# Patient Record
Sex: Female | Born: 1955 | Race: White | Hispanic: No | Marital: Married | Smoking: Former smoker
Health system: Southern US, Community
[De-identification: ages and names within clinical notes are randomized; demographics above are authoritative.]

## PROBLEM LIST (undated history)

## (undated) DIAGNOSIS — R634 Abnormal weight loss: Secondary | ICD-10-CM

## (undated) DIAGNOSIS — A159 Respiratory tuberculosis unspecified: Secondary | ICD-10-CM

---

## 2002-10-01 ENCOUNTER — Encounter: Admission: RE | Admit: 2002-10-01 | Discharge: 2002-10-01 | Payer: Self-pay | Admitting: Specialist

## 2002-10-01 ENCOUNTER — Encounter: Payer: Self-pay | Admitting: Specialist

## 2003-08-23 ENCOUNTER — Other Ambulatory Visit: Admission: RE | Admit: 2003-08-23 | Discharge: 2003-08-23 | Payer: Self-pay | Admitting: Gynecology

## 2004-08-28 ENCOUNTER — Encounter: Admission: RE | Admit: 2004-08-28 | Discharge: 2004-08-28 | Payer: Self-pay | Admitting: Internal Medicine

## 2004-09-18 ENCOUNTER — Encounter (INDEPENDENT_AMBULATORY_CARE_PROVIDER_SITE_OTHER): Payer: Self-pay | Admitting: Specialist

## 2004-09-18 ENCOUNTER — Observation Stay (HOSPITAL_COMMUNITY): Admission: RE | Admit: 2004-09-18 | Discharge: 2004-09-19 | Payer: Self-pay | Admitting: General Surgery

## 2005-04-15 IMAGING — RF DG CHOLANGIOGRAM OPERATIVE
1 series · 12 of 12 positions shown · non-contrast
Comparison: none

CLINICAL DATA: Cholecystectomy for cholelithiasis.

INTRAOPERATIVE CHOLANGIOGRAM
TECHNIQUE: Multiple fluoroscopic spot radiographs were obtained during intraoperative
cholangiogram, and are submitted for interpretation post-operatively.

[Series 1: run · 12 of 12 slices shown]
[im 1/12]
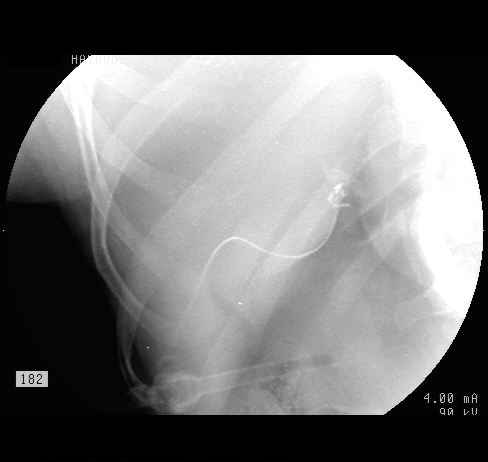
[im 2/12]
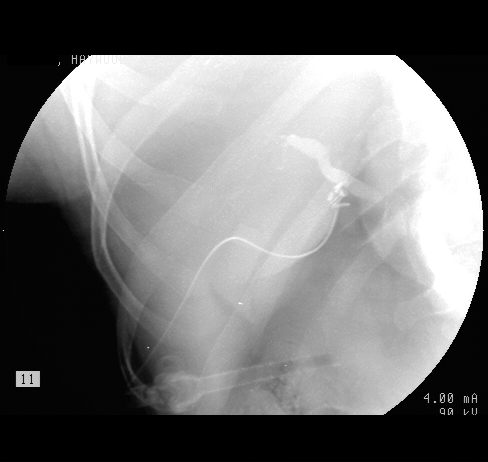
[im 3/12]
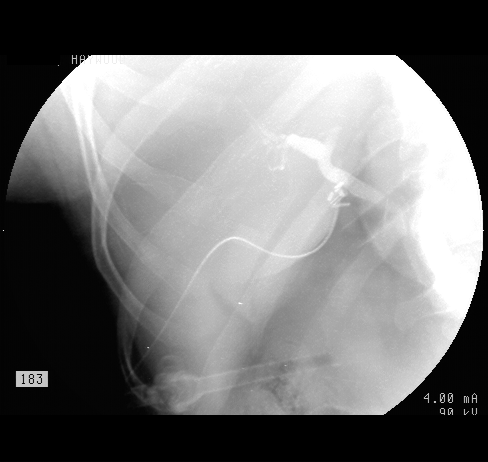
[im 4/12]
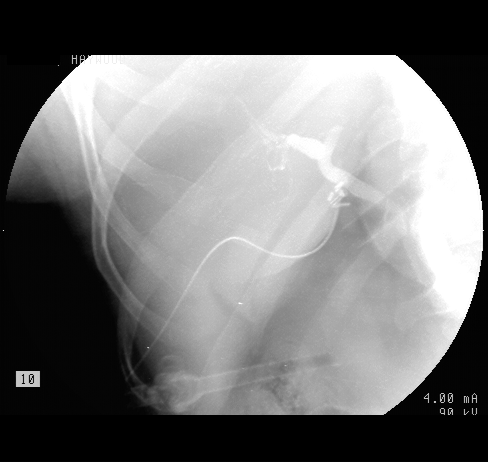
[im 5/12]
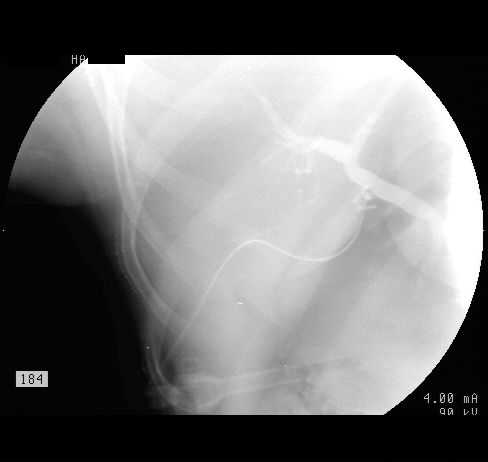
[im 6/12]
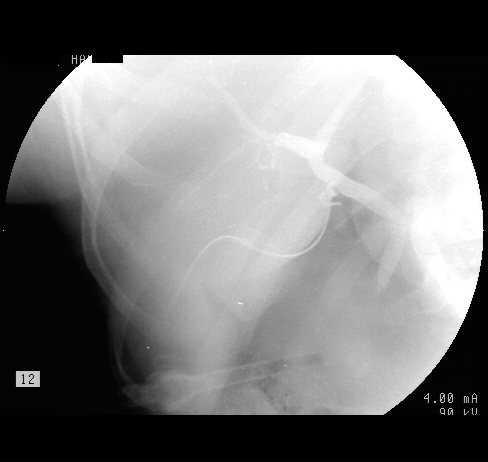
[im 7/12]
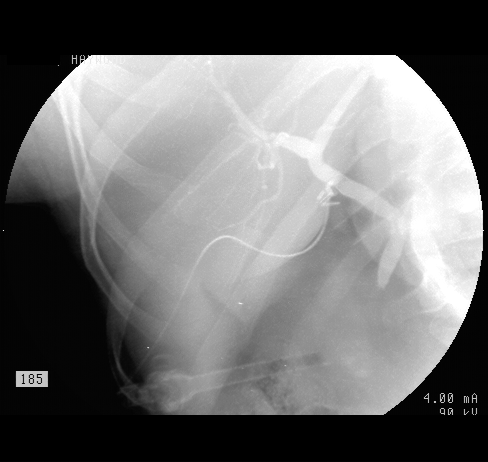
[im 8/12]
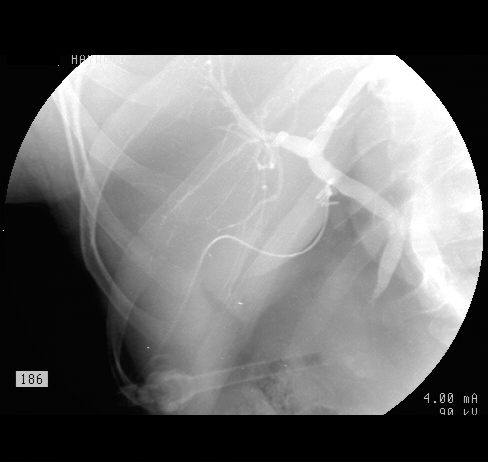
[im 9/12]
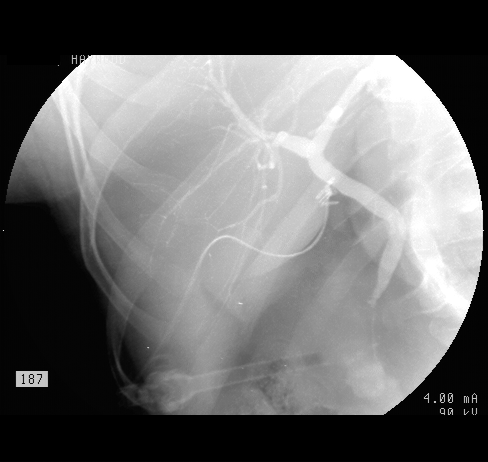
[im 10/12]
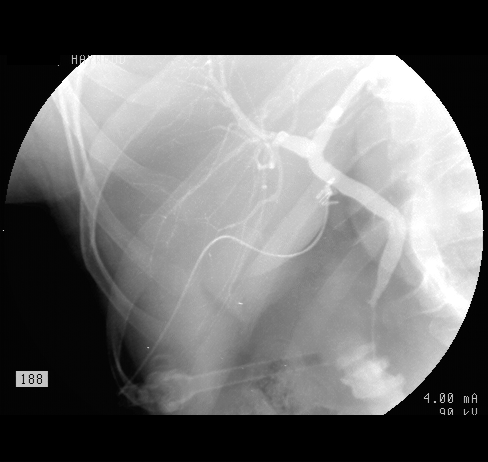
[im 11/12]
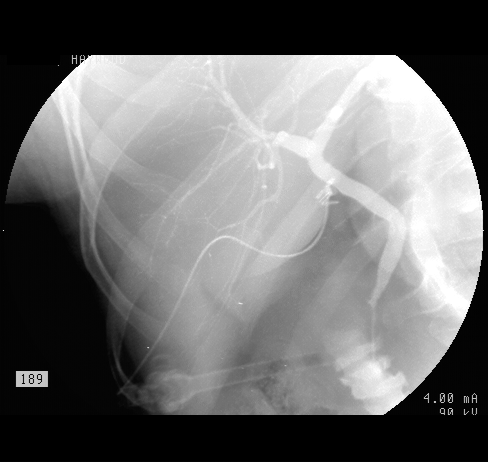
[im 12/12]
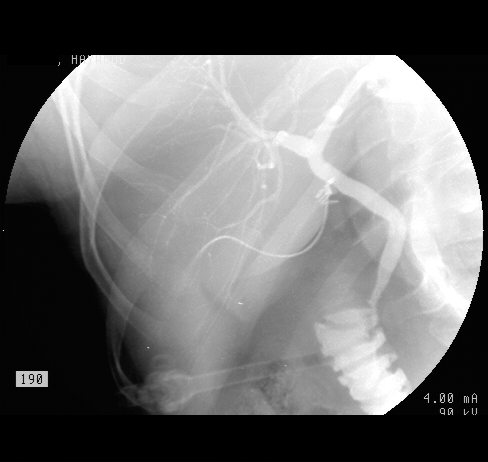

[12 of 12 positions shown; findings below may reference images not displayed]

FINDINGS: Normal appearing bile ducts with free flow of contrast into the duodenum. No intraductal
filling defects. Cholecystectomy clips.

IMPRESSION

No retained ductal stones.

## 2007-01-30 ENCOUNTER — Ambulatory Visit: Payer: Self-pay | Admitting: Family Medicine

## 2007-01-30 LAB — CONVERTED CEMR LAB
AST: 28 units/L (ref 0–37)
Bilirubin, Direct: 0.2 mg/dL (ref 0.0–0.3)
Eosinophils Absolute: 0.1 10*3/uL (ref 0.0–0.6)
Eosinophils Relative: 1.9 % (ref 0.0–5.0)
GFR calc Af Amer: 137 mL/min
GFR calc non Af Amer: 113 mL/min
Glucose, Bld: 112 mg/dL — ABNORMAL HIGH (ref 70–99)
HCT: 42.6 % (ref 36.0–46.0)
Hemoglobin: 15.1 g/dL — ABNORMAL HIGH (ref 12.0–15.0)
Hgb A1c MFr Bld: 6 % (ref 4.6–6.0)
Lymphocytes Relative: 29.5 % (ref 12.0–46.0)
MCV: 86.5 fL (ref 78.0–100.0)
Neutro Abs: 4.3 10*3/uL (ref 1.4–7.7)
Neutrophils Relative %: 60.1 % (ref 43.0–77.0)
Sodium: 143 meq/L (ref 135–145)
TSH: 2.15 microintl units/mL (ref 0.35–5.50)
Total Protein: 7.5 g/dL (ref 6.0–8.3)
WBC: 7.1 10*3/uL (ref 4.5–10.5)

## 2007-02-06 ENCOUNTER — Encounter: Payer: Self-pay | Admitting: Family Medicine

## 2007-02-06 ENCOUNTER — Other Ambulatory Visit: Admission: RE | Admit: 2007-02-06 | Discharge: 2007-02-06 | Payer: Self-pay | Admitting: Family Medicine

## 2007-02-06 ENCOUNTER — Ambulatory Visit: Payer: Self-pay | Admitting: Family Medicine

## 2007-02-23 ENCOUNTER — Encounter: Admission: RE | Admit: 2007-02-23 | Discharge: 2007-02-23 | Payer: Self-pay | Admitting: Family Medicine

## 2007-03-16 ENCOUNTER — Encounter: Admission: RE | Admit: 2007-03-16 | Discharge: 2007-03-16 | Payer: Self-pay | Admitting: Family Medicine

## 2007-03-16 ENCOUNTER — Other Ambulatory Visit: Admission: RE | Admit: 2007-03-16 | Discharge: 2007-03-16 | Payer: Self-pay | Admitting: Interventional Radiology

## 2007-03-16 ENCOUNTER — Encounter (INDEPENDENT_AMBULATORY_CARE_PROVIDER_SITE_OTHER): Payer: Self-pay | Admitting: Specialist

## 2007-05-01 ENCOUNTER — Ambulatory Visit: Payer: Self-pay | Admitting: Endocrinology

## 2007-05-04 ENCOUNTER — Ambulatory Visit: Payer: Self-pay | Admitting: Family Medicine

## 2007-05-10 ENCOUNTER — Encounter: Admission: RE | Admit: 2007-05-10 | Discharge: 2007-05-10 | Payer: Self-pay | Admitting: Family Medicine

## 2007-05-16 ENCOUNTER — Encounter: Admission: RE | Admit: 2007-05-16 | Discharge: 2007-05-16 | Payer: Self-pay | Admitting: Family Medicine

## 2007-09-07 ENCOUNTER — Encounter: Payer: Self-pay | Admitting: *Deleted

## 2007-09-07 DIAGNOSIS — E049 Nontoxic goiter, unspecified: Secondary | ICD-10-CM | POA: Insufficient documentation

## 2007-12-25 ENCOUNTER — Ambulatory Visit: Payer: Self-pay | Admitting: Family Medicine

## 2007-12-25 DIAGNOSIS — E890 Postprocedural hypothyroidism: Secondary | ICD-10-CM

## 2007-12-25 DIAGNOSIS — L719 Rosacea, unspecified: Secondary | ICD-10-CM

## 2008-01-19 ENCOUNTER — Ambulatory Visit: Payer: Self-pay | Admitting: Family Medicine

## 2008-01-19 LAB — CONVERTED CEMR LAB
AST: 29 units/L (ref 0–37)
Basophils Absolute: 0 10*3/uL (ref 0.0–0.1)
Basophils Relative: 0.7 % (ref 0.0–1.0)
CO2: 28 meq/L (ref 19–32)
Chloride: 108 meq/L (ref 96–112)
Creatinine, Ser: 0.7 mg/dL (ref 0.4–1.2)
Direct LDL: 176.9 mg/dL
Eosinophils Relative: 1.4 % (ref 0.0–5.0)
Glucose, Bld: 110 mg/dL — ABNORMAL HIGH (ref 70–99)
Glucose, Urine, Semiquant: NEGATIVE
HCT: 41.8 % (ref 36.0–46.0)
Hemoglobin: 14.6 g/dL (ref 12.0–15.0)
MCHC: 34.8 g/dL (ref 30.0–36.0)
Monocytes Absolute: 0.5 10*3/uL (ref 0.2–0.7)
Neutrophils Relative %: 54.7 % (ref 43.0–77.0)
Nitrite: NEGATIVE
Potassium: 5.1 meq/L (ref 3.5–5.1)
Protein, U semiquant: NEGATIVE
RBC: 4.84 M/uL (ref 3.87–5.11)
RDW: 11.3 % — ABNORMAL LOW (ref 11.5–14.6)
Sodium: 142 meq/L (ref 135–145)
TSH: 2.7 microintl units/mL (ref 0.35–5.50)
Total Bilirubin: 1.1 mg/dL (ref 0.3–1.2)
Total CHOL/HDL Ratio: 6.2
Total Protein: 7.1 g/dL (ref 6.0–8.3)
WBC: 6.3 10*3/uL (ref 4.5–10.5)
pH: 5

## 2008-02-22 ENCOUNTER — Telehealth: Payer: Self-pay | Admitting: Family Medicine

## 2008-03-01 ENCOUNTER — Ambulatory Visit: Payer: Self-pay | Admitting: Family Medicine

## 2008-03-01 ENCOUNTER — Other Ambulatory Visit: Admission: RE | Admit: 2008-03-01 | Discharge: 2008-03-01 | Payer: Self-pay | Admitting: Family Medicine

## 2008-03-01 ENCOUNTER — Encounter: Payer: Self-pay | Admitting: Family Medicine

## 2008-03-01 DIAGNOSIS — N6019 Diffuse cystic mastopathy of unspecified breast: Secondary | ICD-10-CM

## 2008-03-01 DIAGNOSIS — L738 Other specified follicular disorders: Secondary | ICD-10-CM

## 2008-03-23 DIAGNOSIS — L255 Unspecified contact dermatitis due to plants, except food: Secondary | ICD-10-CM

## 2008-03-25 ENCOUNTER — Ambulatory Visit: Payer: Self-pay | Admitting: Family Medicine

## 2011-04-27 NOTE — Consult Note (Signed)
[facility]                          ENDOCRINOLOGY CONSULTATION   NAME:[name], [name]                    MRN:          [id number]  DATE:05/01/2007                            DOB:          1956-09-21    REFERRING PHYSICIAN:  Tinnie Gens A. Tawanna Cooler, MD   REASON FOR REFERRAL:  Thyroid mass.   HISTORY OF THE PRESENT ILLNESS:  This is a 55 year old woman who was  recently noted by Dr. Tawanna Cooler on a routine examination to have a right-  sided thyroid mass.  She states she does not notice it and it is not  bothering her.   PAST MEDICAL HISTORY:  On the past medical history she is otherwise  healthy.   MEDICATIONS:  No medications.   SOCIAL HISTORY:  The patient works in Designer, fashion/clothing.  She is married.  She is  originally from [place].  She states that she did live in [place] for a  time, but was in an area unaffected by the 1986 [employer] developer.   FAMILY HISTORY:  The family history is negative for thyroid disease.   REVIEW OF SYSTEMS:  The patient denies trouble swallowing or breathing.   PHYSICAL EXAMINATION:  VITAL SIGNS:  On physical examination the blood  pressure is 123/79, heart rate 78, temperature 99.3 and the weight is  188 pounds.  GENERAL APPEARANCE:  In general she is in no distress.  SKIN:  The skin shows no rash.  Not diaphoretic.  HEENT:  The head, eyes, ears, nose and throat exam reveals no proptosis  and no periorbital swelling.  NECK:  There is a 3-cm in diameter right-sided thyroid nodule and a  smaller one is on the left.  Both are easily palpable.  NEUROLOGIC EXAMINATION:  Neurologically the patient is alert and  oriented.  She does not appear anxious nor depressed; and, there is no  tremor.   LABORATORY STUDIES:  The laboratory studies forwarded by Dr. Tawanna Cooler  revealed the following.  On January 30, 2007 TSH was normal at 2.15.  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  The left  thyroid mass cytology showed a similar result.   IMPRESSION:  Two large thyroid nodules, both suspicious for malignancy  on biopsy.   PLAN:  1. I will refer the patient to [facility] to consider at      least a diagnostic thyroid lobectomy; if not, a complete      thyroidectomy.  2. I have asked her to return a week or two postoperatively for follow      up.  3. I discussed all this with the patient and she agrees.     Sean A. Everardo All, MD  Electronically Signed    SAE/MedQ  DD: 05/03/2007  DT: 05/04/2007  Job #: [id number]   cc:   Tinnie Gens A. Tawanna Cooler, MD  Angelia Mould. Derrell Lolling, M.D.

## 2011-04-30 NOTE — Assessment & Plan Note (Signed)
[facility]                            [facility] OFFICE NOTE   NAME:HUTCHENSMiami, Latulippe                    MRN:          [id number]  DATE:02/06/2007                            DOB:          Jun 13, 1956    HISTORY OF PRESENT ILLNESS:  This 55 year old married white female  gravida 1, para 1, AB 0, originally from [place], [place] comes in today  as a new patient for physical evaluation.   PAST MEDICAL HISTORY/PAST HOSPITALIZATIONS:  Her gallbladder removed in  2005. A hysterectomy in 2005, but it was done in [place] and they took  her uterus out, left her cervix in, removed both ovaries.  Prior to that  she had childbirth x1.  She has a daughter who is 48 and lives back in  [place].  Outpatient surgery none.  Illnesses none.  Injuries none.   DRUG ALLERGIES:  None.   She does not smoke or drink any alcohol.   CURRENT MEDICATIONS:  She had been taking iron for the past 3 years  because she was told at the time of her hysterectomy, which she had done  for DUB, that she was anemic.  No one ever told her to stop it.   She also has adult acne and would like some medication for that.   REVIEW OF SYSTEMS:  HEENT:  Negative except she wears glasses for  distance vision.  She gets regular dental care. CARDIOPULMONARY:  Negative.  GI:  Negative. GU:  Negative.  GYN:  Gravida 1, para 1, AB 0.  She had a hysterectomy for DUB, no cancer.  Cervix was left intact.  She  does do BSE on an irregular basis and advised to do it monthly.  Also  advised to get annual mammography.  ENDO:  Negative.  Weight steady.  MUSCULOSKELETAL:  Negative. VASCULAR:  Negative.  DERM:  Negative except  for adult acne.   SOCIAL HISTORY:  She is originally from [place], [place]. She met her  husband in [place], they dated for a couple of years, married.  She has  lived her for 3 years.  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  Married, 1 daughter as noted above, 27, lives back  in [place].   FAMILY HISTORY:  Dad died at 37 motor vehicle accident.  Mom 34, alive  and well.  No brothers, no sisters.   VACCINATION HISTORY:  Of note, she says she has it at home and she will  look it up.   PHYSICAL EVALUATION:  VITAL SIGNS:  Height 5 foot 4-3/4 inches.  Weight  186.  BP 120/87, pulse regular.  She is afebrile.  GENERAL:  She is a well-developed, well-nourished white female in no  acute distress, overweight.  HEENT:  Negative.  NECK:  No carotid bruits, thyroid was asymmetric, the right gland being  larger than the left.  It feels like she has a asymmetric goiter, it is  nodular.  CHEST:  Clear to auscultation.  CARDIAC:  Negative.  BREAST:  Negative. BSE was taught and she was asked to __________, also  get annual mammography.  ABDOMEN:  Was negative except for scars from previous gallbladder  removal and hysterectomy.  As noted above, her cervix was left intact.  BIMANUAL:  External genitalia within normal limits.  Vaginal vault was  normal.  Cervix visualized. Pap smear was done.  Bimanual exam showed an  absent uterus and nonpalpable ovaries.  RECTAL:  Normal.  Stool guaiac negative.  EXTREMITIES:  Normal skin.  Peripheral pulses normal.   LABORATORY DATA:  Shows a normal CBC, metabolic panel.  Elevated blood  sugar 112 with an A1c of 6.0.  Cholesterol 230. HDL low at 36, LDL high  at 176.  The rest of her studies look fine.   EKG was within normal limits.   IMPRESSION:  1. Glucose intolerance.  Plan:  Discussed diet, exercise, weight loss.      Three months get an A1c and a fasting blood sugar.  2. Hyperlipidemia.  Diet, exercise, weight loss.  In 2 months fasting      lipid panel.  3. Status post abdominal hysterectomy.  Cervix was left intact.      Parenthetically this was done in [place].  This is how they do it      over there.  4. Status post cholecystectomy.  5. Status post childbirth x1.  6. Adult acne.  Doxy 100 b.i.d.  She can discontinue  the iron, because      she is no longer anemic.     Jeffrey A. Tawanna Cooler, MD  Electronically Signed    JAT/MedQ  DD: 02/06/2007  DT: 02/07/2007  Job #: [id number]

## 2011-04-30 NOTE — Op Note (Signed)
NAMEMAQUITA, SANDOVAL           ACCOUNT NO.:  [id number]   MEDICAL RECORD NO.:  [id number]          PATIENT TYPE:  AMB   LOCATION:  DAY                          FACILITY:  [facility]   PHYSICIAN:  Angelia Mould. Derrell Lolling, M.D.DATE OF BIRTH:  [birth year]   DATE OF PROCEDURE:  09/18/2004  DATE OF DISCHARGE:                                 OPERATIVE REPORT   PREOPERATIVE DIAGNOSES:  Chronic cholecystitis with cholelithiasis.   POSTOPERATIVE DIAGNOSES:  Chronic cholecystitis with cholelithiasis.   OPERATION:  Laparoscopic cholecystectomy with intraoperative cholangiogram.   SURGEON:  Angelia Mould. Derrell Lolling, M.D.   FIRST ASSISTANT:  Sharlet Salina T. Hoxworth, M.D.   INDICATIONS FOR PROCEDURE:  This is a 55 year old white female who has a  long history of heartburn, bloating, epigastric pain and mild nausea.  She  ultimately underwent workup recently and a gallbladder ultrasound on August 11, 2004 showed numerous gallstones and one large stone measuring 2.2 cm.  The common bile duct measured 7 mm.  She has had liver function tests and  they are normal. She has been evaluated as an outpatient and cholecystectomy  advised. She is brought to the operating room electively.   FINDINGS:  The gallbladder was chronically inflamed and thick walled and had  a lot of scar tissue around it. There were moderate adhesions of the  duodenum to the infundibulum but these were soft and chronic, they were  taken down without incident. The liver, stomach, duodenum, small intestines  and large intestines were grossly normal to inspection. She had a lot of  adhesions in the lower abdominal wall from her previous hysterectomy.   TECHNIQUE:  Following the induction of general endotracheal anesthesia, the  patient's abdomen was prepped and draped in a sterile fashion.  Marcaine  0.5% with epinephrine was used as a local infiltration anesthetic.  A  vertically oriented incision was made at the lower rim of the umbilicus. The  fascia was incised in the midline and the abdominal cavity entered under  direct vision.  A 10 mm Hasson trocar was inserted and secured with a  pursestring suture of #0 Vicryl. Pneumoperitoneum was created. The video  camera was inserted with visualization and findings as described above.  A  10 mm trocar was placed in the subxiphoid region, two 5 mm trocars were  placed in the right mid abdomen.  The gallbladder fundus was identified and  grasped and elevated. We dissected all the adhesions down off of the  infundibulum of the gallbladder and retracted the infundibulum laterally.  We spent some time resecting out the cystic duct and the cystic artery. We  actually did the anterior branch of the cystic artery, secured it with  multiple metal clips and divided it. This created a larger window behind the  cystic duct and we isolated the cystic duct.  A cholangiogram catheter was  inserted into the cystic duct and a cholangiogram was obtained using the C-  arm. This showed normal intrahepatic and extrahepatic biliary anatomy, no  filling defect, and prompt flow of contrast into the duodenum.  The  cholangiogram catheter was removed, the cystic duct secured with  multiple  metal clips and divided. We then dissected out a posterior branch to the  cystic artery and secured it with about three metal clips and divided it. We  dissected the gallbladder from its bed with electrocautery. This dissection  was somewhat tedious due to chronic scar tissue. The gallbladder was placed  in the specimen bag and removed through the umbilical port.  The gallbladder  contains numerous large multifaceted black stones.   The operative field was copiously irrigated with saline. All of the  irrigation fluid ultimately returned clear. There was no bleeding and no  bile leak whatsoever from the bed of the gallbladder. The trocars were  removed under direct vision. There was no bleeding from the trocar sites.  The  pneumoperitoneum was released. The fascia at the umbilicus was closed  with #0 Vicryl suture. The skin incision was closed with subcuticular  sutures of 4-0 Vicryl and Steri-Strips.  Clean bandages were placed and the  patient taken to the recovery room in stable condition. Estimated blood loss  was about 20 mL. Complications none. Sponge, needle and instrument counts  were correct.     Hayw   HMI/MEDQ  D:  09/18/2004  T:  09/18/2004  Job:  [id number]   cc:   Martha Clan, M.D.

## 2019-01-03 ENCOUNTER — Ambulatory Visit: Payer: Self-pay | Admitting: Internal Medicine

## 2019-01-05 ENCOUNTER — Ambulatory Visit: Payer: Self-pay | Admitting: Internal Medicine

## 2020-03-07 ENCOUNTER — Encounter: Payer: Self-pay | Admitting: Family Medicine

## 2020-03-07 ENCOUNTER — Ambulatory Visit (INDEPENDENT_AMBULATORY_CARE_PROVIDER_SITE_OTHER): Payer: 59 | Admitting: Family Medicine

## 2020-03-07 ENCOUNTER — Other Ambulatory Visit: Payer: Self-pay

## 2020-03-07 VITALS — BP 130/80 | HR 86 | Temp 96.7°F | Resp 16 | Ht 66.0 in | Wt 135.0 lb

## 2020-03-07 DIAGNOSIS — E113599 Type 2 diabetes mellitus with proliferative diabetic retinopathy without macular edema, unspecified eye: Secondary | ICD-10-CM | POA: Insufficient documentation

## 2020-03-07 DIAGNOSIS — E113512 Type 2 diabetes mellitus with proliferative diabetic retinopathy with macular edema, left eye: Secondary | ICD-10-CM | POA: Diagnosis not present

## 2020-03-07 DIAGNOSIS — I8390 Asymptomatic varicose veins of unspecified lower extremity: Secondary | ICD-10-CM | POA: Insufficient documentation

## 2020-03-07 DIAGNOSIS — E538 Deficiency of other specified B group vitamins: Secondary | ICD-10-CM | POA: Insufficient documentation

## 2020-03-07 DIAGNOSIS — E1169 Type 2 diabetes mellitus with other specified complication: Secondary | ICD-10-CM

## 2020-03-07 DIAGNOSIS — E890 Postprocedural hypothyroidism: Secondary | ICD-10-CM

## 2020-03-07 DIAGNOSIS — E785 Hyperlipidemia, unspecified: Secondary | ICD-10-CM

## 2020-03-07 DIAGNOSIS — I8393 Asymptomatic varicose veins of bilateral lower extremities: Secondary | ICD-10-CM | POA: Diagnosis not present

## 2020-03-07 LAB — COMPREHENSIVE METABOLIC PANEL
ALT: 9 U/L (ref 0–35)
AST: 9 U/L (ref 0–37)
Albumin: 4.1 g/dL (ref 3.5–5.2)
Alkaline Phosphatase: 87 U/L (ref 39–117)
BUN: 15 mg/dL (ref 6–23)
CO2: 26 mEq/L (ref 19–32)
Calcium: 9.7 mg/dL (ref 8.4–10.5)
Chloride: 97 mEq/L (ref 96–112)
Creatinine, Ser: 0.64 mg/dL (ref 0.40–1.20)
GFR: 94.1 mL/min (ref >60.00)
Glucose, Bld: 366 mg/dL — ABNORMAL HIGH (ref 70–99)
Potassium: 4.3 mEq/L (ref 3.5–5.1)
Sodium: 131 mEq/L — ABNORMAL LOW (ref 135–145)
Total Bilirubin: 1.1 mg/dL (ref 0.2–1.2)
Total Protein: 7.2 g/dL (ref 6.0–8.3)

## 2020-03-07 LAB — LDL CHOLESTEROL, DIRECT: Direct LDL: 171 mg/dL

## 2020-03-07 LAB — HEMOGLOBIN A1C: Hgb A1c MFr Bld: 13.7 % — ABNORMAL HIGH (ref 4.6–6.5)

## 2020-03-07 LAB — TSH: TSH: 8.39 u[IU]/mL — ABNORMAL HIGH (ref 0.35–4.50)

## 2020-03-07 LAB — LIPID PANEL
Cholesterol: 245 mg/dL — ABNORMAL HIGH (ref 0–200)
HDL: 42.7 mg/dL (ref >39.00)
NonHDL: 202.01
Total CHOL/HDL Ratio: 6
Triglycerides: 247 mg/dL — ABNORMAL HIGH (ref 0.0–149.0)
VLDL: 49.4 mg/dL — ABNORMAL HIGH (ref 0.0–40.0)

## 2020-03-07 LAB — MICROALBUMIN / CREATININE URINE RATIO
Creatinine,U: 39.6 mg/dL
Microalb Creat Ratio: 3 mg/g (ref 0.0–30.0)
Microalb, Ur: 1.2 mg/dL (ref 0.0–1.9)

## 2020-03-07 MED ORDER — FREESTYLE LIBRE READER DEVI: 1.0000 | 3 refills | Status: AC

## 2020-03-07 MED ORDER — FREESTYLE LIBRE SENSOR SYSTEM MISC: 1.0000 | Freq: Three times a day (TID) | 2 refills | Status: AC | PRN

## 2020-03-07 NOTE — Assessment & Plan Note (Signed)
We discussed some CV benefits of statin medication. Recommendations will be given according to lipid panel results.

## 2020-03-07 NOTE — Assessment & Plan Note (Signed)
Asymptomatic. LE elevation and/or compression stockings may help with progression.

## 2020-03-07 NOTE — Progress Notes (Signed)
HPI:   [name] [name] is a 64 y.o. female, who is here today with her husband to establish care.  Former PCP: N/A Last preventive routine visit: 02/2019.  She is from [place] and may go back May or June this year. Chronic medical problems: DM II, postsurgical hypothyroidism, and hyperlipidemia among some. Former smoker. She smoked for 30+ years and quit 5 months ago.  In average one half PPD.  DM2 diagnosed at age 17. Currently she is on Metformin 850 mg twice daily. She denies checking BS. She would like a glucose sensor, so she does not have to stick fingers. She is tolerating medication well. She is not sure about her last A1c result.  Negative for abdominal pain, nausea,vomiting, polydipsia,polyuria, or polyphagia.  Last ophthalmologic exam on 01/01/20. 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  Hyperlipidemia: On 02/22/2019 TC 276, TG 286, HDL 46, LDL 173. On 07/02/2019: TC 164, TG 109, HDL 43, LDL 99. She discontinued cholesterol medication, not sure which when she was taking.  Negative for severe/frequent headache, chest pain, dyspnea, palpitation, claudication, focal weakness, or edema.  Postsurgical hypothyroidism: Currently she is on levothyroxine 50 mcg daily. According to patient, pathology did not show thyroid cancer.  She has not noted dysphagia, palpitations, abdominal pain, changes in bowel habits, tremor, cold/heat intolerance, or abnormal weight loss.  Lab Results  Component Value Date   TSH 2.70 01/19/2008   LE varicose veins, tortuous. It has been getting worse throughout the years. Denies pain, skin lesions, or edema. She does not wear compression stockings.  Review of Systems  Constitutional: Negative for activity change, appetite change, fatigue and fever.  HENT: Negative for mouth sores, nosebleeds and sore throat.   Eyes:  Negative for redness and visual disturbance.  Respiratory: Negative for cough and wheezing.   Cardiovascular: Negative for leg swelling.  Gastrointestinal: Negative for abdominal pain, nausea and vomiting.       Negative for changes in bowel habits.  Genitourinary: Negative for decreased urine volume, dysuria and hematuria.  Musculoskeletal: Negative for gait problem and myalgias.  Neurological: Negative for syncope, facial asymmetry and numbness.  Rest see pertinent positives and negatives per HPI.   Current Outpatient Medications on File Prior to Visit  Medication Sig Dispense Refill  . Cholecalciferol (VITAMIN D3 PO) Take 1 tablet by mouth daily.    . Cyanocobalamin (B-12 PO) Take 1 tablet by mouth daily.    Marland Kitchen levothyroxine (SYNTHROID) 50 MCG tablet Take 50 mcg by mouth daily before breakfast.    . metFORMIN (GLUCOPHAGE) 850 MG tablet Take 1 tablet by mouth 2 (two) times daily with a meal.     No current facility-administered medications on file prior to visit.   History reviewed. No pertinent past medical history. No Known Allergies  History reviewed. No pertinent family history.  Social History   Socioeconomic History  . Marital status: Married    Spouse name: Not on file  . Number of children: Not on file  . Years of education: Not on file  . Highest education level: Not on file  Occupational History  . Not on file  Tobacco Use  . Smoking status: Never Smoker  . Smokeless tobacco: Never Used  Substance and Sexual Activity  . Alcohol use: Not Currently  . Drug use: Never  . Sexual activity: Not Currently  Other Topics Concern  . Not on file  Social History  Narrative  . Not on file   Social Determinants of Health   Financial Resource Strain:   . Difficulty of Paying Living Expenses:   Food Insecurity:   . Worried About Charity fundraiser in the Last Year:   . Arboriculturist in the Last Year:   Transportation Needs:   . Film/video editor (Medical):     Marland Kitchen Lack of Transportation (Non-Medical):   Physical Activity:   . Days of Exercise per Week:   . Minutes of Exercise per Session:   Stress:   . Feeling of Stress :   Social Connections:   . Frequency of Communication with Friends and Family:   . Frequency of Social Gatherings with Friends and Family:   . Attends Religious Services:   . Active Member of Clubs or Organizations:   . Attends Archivist Meetings:   Marland Kitchen Marital Status:     Vitals:   03/07/20 1016  BP: 130/80  Pulse: 86  Resp: 16  Temp: (!) 96.7 F (35.9 C)  SpO2: 96%   Body mass index is 21.79 kg/m.   Physical Exam  Nursing note and vitals reviewed. Constitutional: She is oriented to person, place, and time. She appears well-developed and well-nourished. No distress.  HENT:  Head: Normocephalic and atraumatic.  Mouth/Throat: Oropharynx is clear and moist and mucous membranes are normal.  Eyes: Pupils are equal, round, and reactive to light. Conjunctivae are normal.  Cardiovascular: Normal rate and regular rhythm.  No murmur heard. Pulses:      Dorsalis pedis pulses are 2+ on the right side and 2+ on the left side.  Tortuous varicose veins, bilateral but right calf is worse.  Respiratory: Effort normal and breath sounds normal. No respiratory distress.  GI: Soft. She exhibits no mass. There is no hepatomegaly. There is no abdominal tenderness.  Musculoskeletal:        General: No edema.  Lymphadenopathy:    She has no cervical adenopathy.  Neurological: She is alert and oriented to person, place, and time. She has normal strength. No cranial nerve deficit. Gait normal.  Skin: Skin is warm. No rash noted. No erythema.  Psychiatric: She has a normal mood and affect.  Well groomed, good eye contact.   Diabetic Foot Exam - Simple   Simple Foot Form Diabetic Foot exam was performed with the following findings: Yes 03/07/2020  1:15 PM  Visual Inspection See comments: Yes Sensation Testing Intact to  touch and monofilament testing bilaterally: Yes Pulse Check Posterior Tibialis and Dorsalis pulse intact bilaterally: Yes Comments Small bunion, bilateral.    ASSESSMENT AND PLAN:  Ms. [name] [name] was seen today for establish care.  Diagnoses and all orders for this visit:  Orders Placed This Encounter  Procedures  . Comprehensive metabolic panel  . Lipid panel  . Hemoglobin A1c  . Fructosamine  . Microalbumin / creatinine urine ratio  . TSH   Lab Results  Component Value Date   CREATININE 0.64 03/07/2020   BUN 15 03/07/2020   NA 131 (L) 03/07/2020   K 4.3 03/07/2020   CL 97 03/07/2020   CO2 26 03/07/2020   Lab Results  Component Value Date   ALT 9 03/07/2020   AST 9 03/07/2020   ALKPHOS 87 03/07/2020   BILITOT 1.1 03/07/2020   Lab Results  Component Value Date   HGBA1C 13.7 (H) 03/07/2020   Lab Results  Component Value Date   MICROALBUR 1.2 03/07/2020    Lab Results  Component Value Date   CHOL 245 (H) 03/07/2020   HDL 42.70 03/07/2020   LDLDIRECT 171.0 03/07/2020   TRIG 247.0 (H) 03/07/2020   CHOLHDL 6 03/07/2020    Hypothyroidism, postsurgical Continue levothyroxine 50 mcg daily. Further recommendation will be given according to TSH result.  Hyperlipidemia associated with type 2 diabetes mellitus (HCC) We discussed some CV benefits of statin medication. Recommendations will be given according to lipid panel results.  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 For now continue Metformin 850 mg twice daily. Regular exercise and healthy diet with avoidance of added sugar food intake is an important part of treatment and recommended. Continue periodic eye examination,dental and foot care recommended. F/U in 5 months   Varicose vein of leg Asymptomatic. LE elevation and/or compression stockings may help with progression.   Return in about 5 months (around 08/07/2020) for DM  II,HLD.   Adib Wahba G. [place], MD  [facility]. [place] office.

## 2020-03-07 NOTE — Patient Instructions (Addendum)
A few things to remember from today's visit:   No changes today. Further recommendations according to lab results.  You should be on a cholesterol medication for cardiovascular protection.   Please be sure medication list is accurate. If a new problem present, please set up appointment sooner than planned today.

## 2020-03-07 NOTE — Assessment & Plan Note (Signed)
Continue levothyroxine 50 mcg daily. Further recommendation will be given according to TSH result. 

## 2020-03-07 NOTE — Assessment & Plan Note (Signed)
Problem has not been well controlled. For now continue Metformin 850 mg twice daily. Regular exercise and healthy diet with avoidance of added sugar food intake is an important part of treatment and recommended. Continue periodic eye examination,dental and foot care recommended. F/U in 5 months

## 2020-03-12 LAB — FRUCTOSAMINE: Fructosamine: 590 umol/L — ABNORMAL HIGH (ref 205–285)

## 2020-03-19 ENCOUNTER — Encounter: Payer: Self-pay | Admitting: Family Medicine

## 2020-03-21 ENCOUNTER — Encounter: Payer: Self-pay | Admitting: Family Medicine

## 2020-08-08 ENCOUNTER — Ambulatory Visit: Payer: Self-pay | Admitting: Family Medicine

## 2020-08-08 DIAGNOSIS — Z0289 Encounter for other administrative examinations: Secondary | ICD-10-CM

## 2021-01-22 ENCOUNTER — Emergency Department (HOSPITAL_COMMUNITY)
Admission: EM | Admit: 2021-01-22 | Discharge: 2021-01-22 | Disposition: A | Discharge status: Home / Self Care | Payer: BLUE CROSS/BLUE SHIELD | Attending: Emergency Medicine | Admitting: Emergency Medicine

## 2021-01-22 ENCOUNTER — Emergency Department (HOSPITAL_COMMUNITY): Payer: BLUE CROSS/BLUE SHIELD

## 2021-01-22 ENCOUNTER — Encounter (HOSPITAL_COMMUNITY): Payer: Self-pay | Admitting: Emergency Medicine

## 2021-01-22 DIAGNOSIS — M545 Low back pain, unspecified: Secondary | ICD-10-CM | POA: Diagnosis not present

## 2021-01-22 DIAGNOSIS — W010XXA Fall on same level from slipping, tripping and stumbling without subsequent striking against object, initial encounter: Secondary | ICD-10-CM

## 2021-01-22 DIAGNOSIS — R519 Headache, unspecified: Secondary | ICD-10-CM | POA: Diagnosis not present

## 2021-01-22 DIAGNOSIS — E113599 Type 2 diabetes mellitus with proliferative diabetic retinopathy without macular edema, unspecified eye: Secondary | ICD-10-CM | POA: Diagnosis not present

## 2021-01-22 DIAGNOSIS — Y92002 Bathroom of unspecified non-institutional (private) residence single-family (private) house as the place of occurrence of the external cause: Secondary | ICD-10-CM | POA: Insufficient documentation

## 2021-01-22 DIAGNOSIS — W0110XA Fall on same level from slipping, tripping and stumbling with subsequent striking against unspecified object, initial encounter: Secondary | ICD-10-CM | POA: Diagnosis not present

## 2021-01-22 DIAGNOSIS — Z7984 Long term (current) use of oral hypoglycemic drugs: Secondary | ICD-10-CM | POA: Insufficient documentation

## 2021-01-22 DIAGNOSIS — S22080A Wedge compression fracture of T11-T12 vertebra, initial encounter for closed fracture: Secondary | ICD-10-CM | POA: Diagnosis present

## 2021-01-22 DIAGNOSIS — Z87891 Personal history of nicotine dependence: Secondary | ICD-10-CM | POA: Diagnosis not present

## 2021-01-22 DIAGNOSIS — E890 Postprocedural hypothyroidism: Secondary | ICD-10-CM | POA: Insufficient documentation

## 2021-01-22 DIAGNOSIS — Z79899 Other long term (current) drug therapy: Secondary | ICD-10-CM | POA: Insufficient documentation

## 2021-01-22 DIAGNOSIS — S22000A Wedge compression fracture of unspecified thoracic vertebra, initial encounter for closed fracture: Secondary | ICD-10-CM

## 2021-01-22 HISTORY — DX: Wedge compression fracture of T11-T12 vertebra, initial encounter for closed fracture: S22.080A

## 2021-01-22 LAB — URINALYSIS, ROUTINE W REFLEX MICROSCOPIC
Bilirubin Urine: NEGATIVE
Glucose, UA: 250 mg/dL — AB
Ketones, ur: NEGATIVE mg/dL
Leukocytes,Ua: NEGATIVE
Nitrite: NEGATIVE
Protein, ur: 100 mg/dL — AB
Specific Gravity, Urine: 1.02 (ref 1.005–1.030)
pH: 6 (ref 5.0–8.0)

## 2021-01-22 LAB — CBC
HCT: 37.7 % (ref 36.0–46.0)
Hemoglobin: 12.1 g/dL (ref 12.0–15.0)
MCH: 25.9 pg — ABNORMAL LOW (ref 26.0–34.0)
MCHC: 32.1 g/dL (ref 30.0–36.0)
MCV: 80.6 fL (ref 80.0–100.0)
Platelets: 432 10*3/uL — ABNORMAL HIGH (ref 150–400)
RBC: 4.68 MIL/uL (ref 3.87–5.11)
RDW: 13.6 % (ref 11.5–15.5)
WBC: 11.5 10*3/uL — ABNORMAL HIGH (ref 4.0–10.5)
nRBC: 0 % (ref 0.0–0.2)

## 2021-01-22 LAB — BASIC METABOLIC PANEL
Anion gap: 10 (ref 5–15)
BUN: 14 mg/dL (ref 8–23)
CO2: 28 mmol/L (ref 22–32)
Calcium: 8.7 mg/dL — ABNORMAL LOW (ref 8.9–10.3)
Chloride: 89 mmol/L — ABNORMAL LOW (ref 98–111)
Creatinine, Ser: 0.53 mg/dL (ref 0.44–1.00)
GFR, Estimated: >60 mL/min (ref >60)
Glucose, Bld: 296 mg/dL — ABNORMAL HIGH (ref 70–99)
Potassium: 4.1 mmol/L (ref 3.5–5.1)
Sodium: 127 mmol/L — ABNORMAL LOW (ref 135–145)

## 2021-01-22 LAB — URINALYSIS, MICROSCOPIC (REFLEX)

## 2021-01-22 LAB — HEPATIC FUNCTION PANEL
ALT: 16 U/L (ref 0–44)
AST: 22 U/L (ref 15–41)
Albumin: 2.2 g/dL — ABNORMAL LOW (ref 3.5–5.0)
Alkaline Phosphatase: 95 U/L (ref 38–126)
Bilirubin, Direct: 0.2 mg/dL (ref 0.0–0.2)
Indirect Bilirubin: 0.9 mg/dL (ref 0.3–0.9)
Total Bilirubin: 1.1 mg/dL (ref 0.3–1.2)
Total Protein: 6.6 g/dL (ref 6.5–8.1)

## 2021-01-22 LAB — CBG MONITORING, ED
Glucose-Capillary: 284 mg/dL — ABNORMAL HIGH (ref 70–99)
Glucose-Capillary: 256 mg/dL — ABNORMAL HIGH (ref 70–99)

## 2021-01-22 LAB — LIPASE, BLOOD: Lipase: 29 U/L (ref 11–51)

## 2021-01-22 MED ORDER — TRAMADOL HCL 50 MG PO TABS: 50.0000 mg | ORAL_TABLET | Freq: Three times a day (TID) | ORAL | 0 refills | Status: AC | PRN

## 2021-01-22 MED ORDER — TRAMADOL HCL 50 MG PO TABS
50.0000 mg | ORAL_TABLET | Freq: Once | ORAL | Status: AC
  Administered 2021-01-22: 50 mg via ORAL
  Filled 2021-01-22: qty 1

## 2021-01-22 MED ORDER — SODIUM CHLORIDE 0.9 % IV BOLUS
1000.0000 mL | Freq: Once | INTRAVENOUS | Status: AC
  Administered 2021-01-22: 1000 mL via INTRAVENOUS

## 2021-01-22 NOTE — ED Notes (Signed)
Attempted to reach all available contacts listed for discharge notification

## 2021-01-22 NOTE — ED Notes (Signed)
Pt discharged, being moved to the lobby to await ride.

## 2021-01-22 NOTE — Progress Notes (Signed)
Orthopedic Tech Progress Note Patient Details:  [name] [name] January 31, 1956 [id number] TLSO Brace has been ordered Patient ID: [name] [name], female   DOB: Apr 14, 1956, 65 y.o.   MRN: [id number]    [name] 01/22/2021, 10:24 PM

## 2021-01-22 NOTE — ED Triage Notes (Addendum)
Patient BIB [facility] for evaluation of fall two days ago, patient states she was in the bathroom and slipped on a wet floor and hit her head on the floor. Denies LOC. EMS also reports per family patient has had no appetite for the last two weeks and over the last few days when she tries to eat she vomits. Patient denies vomiting in the last two days. Patient complains of lower back pain after the fall.

## 2021-01-22 NOTE — ED Notes (Signed)
Othro tech contacted, will be at bedside to place TLSO brace asap.

## 2021-01-22 NOTE — Discharge Instructions (Addendum)
It was our pleasure to provide your ER care today - we hope that you feel better.  Wear TLSO back brace when up or out of bed.  Take acetaminophen as need for pain. You may also take ultram as need for pain.   Follow up with neurosurgeon (back/spine specialist) in two weeks - call office tomorrow AM to arrange appointment - when you call, tell them that the ER discussed your case with Dr Maisie Fus.   If your pain is not controlled with medication, discuss with your doctor possibly setting up a 'kyphoplasty' procedure with radiology.   We have also made a home health referral to offer additional assistance at home - they should be contacting you in the next few days.   Return to ER if worse, new symptoms, fevers, numbness/weakness, intractable pain, or other concern.

## 2021-01-22 NOTE — ED Provider Notes (Addendum)
[facility] EMERGENCY DEPARTMENT Provider Note   CSN: [id number] Arrival date & time: 01/22/21  1201     History Chief Complaint  Patient presents with  . Fall    Mignon Bechler is a 65 y.o. female.  Patient presents s/p fall a few days ago, states slipped on wet floor, fell, hit head, dazed, no loc. C/o lower thoracic and lumbar pain post fall, constant, dull, acute onset post fall, mod--severe, non radiating. No saddle area or leg numbness. No weakness. Dull headache post fall, moderate. No neck or upper back pain. No chest pain or discomfort. No sob. No abd pain. Report of nv in past couple days, but pt denies any vomiting today. No dysuria or gu c/o. No fever or chills. No anticoag use. Pt denies any faintness or dizziness prior to fall.   The history is provided by the patient and the EMS personnel.  Fall Associated symptoms include headaches. Pertinent negatives include no chest pain, no abdominal pain and no shortness of breath.       History reviewed. No pertinent past medical history.  Patient Active Problem List   Diagnosis Date Noted  . Type II diabetes mellitus with proliferative retinopathy (HCC) 03/07/2020  . Varicose vein of leg 03/07/2020  . B12 deficiency 03/07/2020  . Hyperlipidemia associated with type 2 diabetes mellitus (HCC) 03/07/2020  . RHUS DERMATITIS 03/23/2008  . FIBROCYSTIC BREAST DISEASE 03/01/2008  . FOLLICULITIS 03/01/2008  . 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 ACNE, ROSACEA 12/25/2007  . THYROID MASS 09/07/2007    History reviewed. No pertinent surgical history.   OB History   No obstetric history on file.     No family history on file.  Social History   Tobacco Use  . Smoking status: Former Smoker    Types: Cigarettes    Quit date: 08/28/2019    Years since quitting: 1.4  . Smokeless tobacco: Never Used  Substance Use Topics  . Alcohol use: Not Currently  . Drug use: Never    Home  Medications Prior to Admission medications   Medication Sig Start Date End Date Taking? Authorizing Provider  Cholecalciferol (VITAMIN D3 PO) Take 1 tablet by mouth daily.    [provider]  Continuous Blood Gluc Receiver (FREESTYLE LIBRE READER) DEVI 1 Device by Does not apply route as directed. 03/07/20   [place], Betty G, MD  Continuous Blood Gluc Sensor (FREESTYLE LIBRE SENSOR SYSTEM) MISC 1 Device by Does not apply route 3 (three) times daily as needed. 03/07/20   [place], Betty G, MD  Cyanocobalamin (B-12 PO) Take 1 tablet by mouth daily.    [provider]  levothyroxine (SYNTHROID) 50 MCG tablet Take 50 mcg by mouth daily before breakfast.    [provider]  metFORMIN (GLUCOPHAGE) 850 MG tablet Take 1 tablet by mouth 2 (two) times daily with a meal. 06/27/19   [provider]    Allergies    Patient has no known allergies.  Review of Systems   Review of Systems  Constitutional: Negative for chills and fever.  HENT: Negative for sore throat.   Eyes: Negative for visual disturbance.  Respiratory: Negative for cough and shortness of breath.   Cardiovascular: Negative for chest pain.  Gastrointestinal: Negative for abdominal pain.  Genitourinary: Negative for dysuria and flank pain.  Musculoskeletal: Positive for back pain. Negative for neck pain.  Skin: Negative for wound.  Neurological: Positive for headaches. Negative for weakness and numbness.  Hematological: Does not bruise/bleed easily.  Psychiatric/Behavioral: Negative for confusion.    Physical Exam Updated Vital Signs BP 121/72   Pulse 85   Temp 98.7 F (37.1 C) (Oral)   Resp (!) 24   SpO2 92%   Physical Exam Vitals and nursing note reviewed.  Constitutional:      Appearance: Normal appearance. She is well-developed.  HENT:     Head:     Comments: Mild contusion/tenderness to scalp.     Nose: Nose normal.     Mouth/Throat:     Mouth: Mucous membranes are moist.  Eyes:      General: No scleral icterus.    Conjunctiva/sclera: Conjunctivae normal.     Pupils: Pupils are equal, round, and reactive to light.  Neck:     Vascular: No carotid bruit.     Trachea: No tracheal deviation.  Cardiovascular:     Rate and Rhythm: Normal rate and regular rhythm.     Pulses: Normal pulses.     Heart sounds: Normal heart sounds. No murmur heard. No friction rub. No gallop.   Pulmonary:     Effort: Pulmonary effort is normal. No respiratory distress.     Breath sounds: Normal breath sounds.  Chest:     Chest wall: No tenderness.  Abdominal:     General: Bowel sounds are normal. There is no distension.     Palpations: Abdomen is soft.     Tenderness: There is no abdominal tenderness. There is no guarding.     Comments: No abd pain, contusion, bruising, or tenderness.   Genitourinary:    Comments: No cva tenderness.  Musculoskeletal:        General: No swelling.     Cervical back: Normal range of motion and neck supple. No rigidity or tenderness. No muscular tenderness.     Comments: Upper and lower lumbar tenderness w palpation, otherwise, CTLS spine, non tender, aligned, no step off. Good rom bil ext without pain or focal bony tenderness.   Skin:    General: Skin is warm and dry.     Findings: No rash.  Neurological:     Mental Status: She is alert.     Comments: Alert, speech normal. GCS 15. Motor/sens grossly intact bil. stre 5/5.   Psychiatric:        Mood and Affect: Mood normal.     ED Results / Procedures / Treatments   Labs (all labs ordered are listed, but only abnormal results are displayed) Results for orders placed or performed during the hospital encounter of 01/22/21  Basic metabolic panel  Result Value Ref Range   Sodium 127 (L) 135 - 145 mmol/L   Potassium 4.1 3.5 - 5.1 mmol/L   Chloride 89 (L) 98 - 111 mmol/L   CO2 28 22 - 32 mmol/L   Glucose, Bld 296 (H) 70 - 99 mg/dL   BUN 14 8 - 23 mg/dL   Creatinine, Ser 5.40 0.44 - 1.00 mg/dL    Calcium 8.7 (L) 8.9 - 10.3 mg/dL   GFR, Estimated >08 >67 mL/min   Anion gap 10 5 - 15  CBC  Result Value Ref Range   WBC 11.5 (H) 4.0 - 10.5 K/uL   RBC 4.68 3.87 - 5.11 MIL/uL   Hemoglobin 12.1 12.0 - 15.0 g/dL   HCT 61.9 50.9 - 32.6 %   MCV 80.6 80.0 - 100.0 fL   MCH 25.9 (L) 26.0 - 34.0 pg   MCHC 32.1 30.0 - 36.0 g/dL   RDW 71.2 45.8 - 09.9 %  Platelets 432 (H) 150 - 400 K/uL   nRBC 0.0 0.0 - 0.2 %  Urinalysis, Routine w reflex microscopic Urine, Clean Catch  Result Value Ref Range   Color, Urine YELLOW YELLOW   APPearance CLEAR CLEAR   Specific Gravity, Urine 1.020 1.005 - 1.030   pH 6.0 5.0 - 8.0   Glucose, UA 250 (A) NEGATIVE mg/dL   Hgb urine dipstick TRACE (A) NEGATIVE   Bilirubin Urine NEGATIVE NEGATIVE   Ketones, ur NEGATIVE NEGATIVE mg/dL   Protein, ur 160 (A) NEGATIVE mg/dL   Nitrite NEGATIVE NEGATIVE   Leukocytes,Ua NEGATIVE NEGATIVE  Hepatic function panel  Result Value Ref Range   Total Protein 6.6 6.5 - 8.1 g/dL   Albumin 2.2 (L) 3.5 - 5.0 g/dL   AST 22 15 - 41 U/L   ALT 16 0 - 44 U/L   Alkaline Phosphatase 95 38 - 126 U/L   Total Bilirubin 1.1 0.3 - 1.2 mg/dL   Bilirubin, Direct 0.2 0.0 - 0.2 mg/dL   Indirect Bilirubin 0.9 0.3 - 0.9 mg/dL  Lipase, blood  Result Value Ref Range   Lipase 29 11 - 51 U/L  Urinalysis, Microscopic (reflex)  Result Value Ref Range   RBC / HPF 6-10 0 - 5 RBC/hpf   WBC, UA 0-5 0 - 5 WBC/hpf   Bacteria, UA RARE (A) NONE SEEN   Squamous Epithelial / LPF 0-5 0 - 5   Mucus PRESENT   CBG monitoring, ED  Result Value Ref Range   Glucose-Capillary 284 (H) 70 - 99 mg/dL  CBG monitoring, ED  Result Value Ref Range   Glucose-Capillary 256 (H) 70 - 99 mg/dL   DG Lumbar Spine Complete  Result Date: 01/22/2021 CLINICAL DATA:  Fall 2 days ago EXAM: LUMBAR SPINE - COMPLETE 4+ VIEW COMPARISON:  None. FINDINGS: Lumbar vertebral body heights and alignment are maintained. Age-indeterminate moderate compression fracture of T12. No  substantial disc space narrowing. Multilevel facet hypertrophy. IMPRESSION: Age-indeterminate moderate compression fracture of T12. Electronically Signed   By: Guadlupe Spanish M.D.   On: 01/22/2021 13:21   CT HEAD WO CONTRAST  Result Date: 01/22/2021 CLINICAL DATA:  Fall EXAM: CT HEAD WITHOUT CONTRAST TECHNIQUE: Contiguous axial images were obtained from the base of the skull through the vertex without intravenous contrast. COMPARISON:  None. FINDINGS: Brain: Tiny hypoattenuating focus in the anterior limb left internal capsule including sequela of prior lacunar type infarct. No evidence of acute infarction, hemorrhage, hydrocephalus, extra-axial collection, visible mass lesion or mass effect. Symmetric prominence of the ventricles, cisterns and sulci compatible with parenchymal volume loss. Patchy areas of white matter hypoattenuation are most compatible with chronic microvascular angiopathy. Vascular: Atherosclerotic calcification of the carotid siphons. No hyperdense vessel. Skull: Left frontal scalp and supraorbital soft tissue thickening and swelling with some asymmetric left palpebral thickening as well. No subjacent calvarial fracture or visible facial bone fracture is seen within the margins of imaging. Sinuses/Orbits: Paranasal sinuses and mastoid air cells are predominantly clear. Mild left periorbital swelling, as above. No retro septal gas, stranding or hemorrhage. Included orbital structures are unremarkable. Other: None. IMPRESSION: 1. Left frontal scalp and supraorbital soft tissue thickening and swelling with some asymmetric left palpebral thickening as well. No subjacent calvarial fracture or visible facial bone fracture is seen within the margins of imaging. 2. No acute intracranial abnormality. 3. Tiny hypoattenuating focus in the anterior limb left internal capsule including sequela of prior lacunar type infarct. 4. Chronic microvascular angiopathy and parenchymal volume loss. Electronically  Signed   By: Kreg Shropshire M.D.   On: 01/22/2021 20:02   CT Lumbar Spine Wo Contrast  Result Date: 01/22/2021 CLINICAL DATA:  Fall, low back pain, trauma EXAM: CT LUMBAR SPINE WITHOUT CONTRAST TECHNIQUE: Multidetector CT imaging of the lumbar spine was performed without intravenous contrast administration. Multiplanar CT image reconstructions were also generated. COMPARISON:  Radiograph 01/22/2021 FINDINGS: Segmentation: 5 normally formed lumbar type vertebral levels. Alignment: Preservation of the normal lumbar lordosis. Vertebrae: There is a biconvex compression deformity of the T12 vertebral body with acute transcortical lucency through the superior and inferior endplates as well as the anterior and posterior cortices technically qualifying this has a burst fracture deformity albeit with only minimal retropulsion of the superior endplate of approximately 2 mm. Scattered foci of gas within the fracture line likely related to the presence of nitrogenous gas in the T11-12 and T12-L1 disc spaces. No clear extension into the posterior tension band. No associated widening of the articular facets or spinous processes. No other acute fracture or osseous injury is identified. Mild diffuse bony demineralization. Discogenic and facet degenerative changes throughout the lumbar spine. Additional mild arthrosis at the bilateral SI joints with some enthesopathic changes on the iliac crests. Paraspinal and other soft tissues: Mild paravertebral swelling and thickening adjacent the fracture line compatible with an acute injury. Additional included portions of posterior abdomen and pelvis demonstrate atherosclerotic calcification of the abdominal aorta and iliac arteries and a prior cholecystectomy without acute worrisome abnormality. Disc levels: Level by level evaluation of the lumbar spine below: T11-12: Nitrogenous gas within the disc space. Superior endplate deformity T12 with minimal 2 mm of retropulsion and small global  disc bulge with mild bilateral facet arthropathy. No significant resulting canal stenosis foraminal narrowing is seen. T12-L1: Disc height loss and desiccation without significant posterior disc abnormality. Mild bilateral facet arthropathy. No significant spinal canal or foraminal stenosis. L1-L2: Mild disc height loss and discogenic changes with mild bilateral facet arthropathy. No significant spinal canal or foraminal stenosis. L2-L3: Mild disc height loss and discogenic changes with mild facet arthropathy. Shallow global disc bulge. No significant spinal canal or foraminal stenosis. L3-L4: Mild disc height loss and moderate bilateral facet arthropathy. Shallow global disc bulge. No significant canal stenosis. Mild bilateral foraminal narrowing, right greater than left with partial effacement of the lateral recesses. L4-L5: Minimal disc height loss and discogenic changes and shallow global disc bulge with moderate bilateral facet arthropathy. Right subarticular disc protrusion. No significant canal stenosis or left foraminal narrowing. Mild right foraminal narrowing with partial effacement of the lateral recess. L5-S1: Mild disc height loss with shallow global disc bulge and moderate bilateral facet arthropathy. No significant spinal canal or foraminal stenosis. IMPRESSION: 1. Acute biconvex compression deformity of the T12 vertebral body with acute transcortical lucency through the superior and inferior endplates as well as the anterior and posterior cortices technically qualifying this as a burst fracture (AO spine A4) albeit with only minimal 2 mm of retropulsion of the superior endplate of approximately 2 mm without resulting canal stenosis. No clear extension into the posterior tension band. Mild paravertebral swelling and thickening adjacent the fracture line compatible with an acute injury. Intervertebral gas likely related to intravasation of the degenerative nitrogenous gas phenomenon seen within the  adjacent disc spaces. 2. Discogenic and facet degenerative changes throughout the lumbar spine as described above. 3. Aortic Atherosclerosis (ICD10-I70.0). Electronically Signed   By: Kreg Shropshire M.D.   On: 01/22/2021 20:18    EKG None  Radiology DG Lumbar Spine Complete  Result Date: 01/22/2021 CLINICAL DATA:  Fall 2 days ago EXAM: LUMBAR SPINE - COMPLETE 4+ VIEW COMPARISON:  None. FINDINGS: Lumbar vertebral body heights and alignment are maintained. Age-indeterminate moderate compression fracture of T12. No substantial disc space narrowing. Multilevel facet hypertrophy. IMPRESSION: Age-indeterminate moderate compression fracture of T12. Electronically Signed   By: Guadlupe Spanish M.D.   On: 01/22/2021 13:21   CT HEAD WO CONTRAST  Result Date: 01/22/2021 CLINICAL DATA:  Fall EXAM: CT HEAD WITHOUT CONTRAST TECHNIQUE: Contiguous axial images were obtained from the base of the skull through the vertex without intravenous contrast. COMPARISON:  None. FINDINGS: Brain: Tiny hypoattenuating focus in the anterior limb left internal capsule including sequela of prior lacunar type infarct. No evidence of acute infarction, hemorrhage, hydrocephalus, extra-axial collection, visible mass lesion or mass effect. Symmetric prominence of the ventricles, cisterns and sulci compatible with parenchymal volume loss. Patchy areas of white matter hypoattenuation are most compatible with chronic microvascular angiopathy. Vascular: Atherosclerotic calcification of the carotid siphons. No hyperdense vessel. Skull: Left frontal scalp and supraorbital soft tissue thickening and swelling with some asymmetric left palpebral thickening as well. No subjacent calvarial fracture or visible facial bone fracture is seen within the margins of imaging. Sinuses/Orbits: Paranasal sinuses and mastoid air cells are predominantly clear. Mild left periorbital swelling, as above. No retro septal gas, stranding or hemorrhage. Included orbital  structures are unremarkable. Other: None. IMPRESSION: 1. Left frontal scalp and supraorbital soft tissue thickening and swelling with some asymmetric left palpebral thickening as well. No subjacent calvarial fracture or visible facial bone fracture is seen within the margins of imaging. 2. No acute intracranial abnormality. 3. Tiny hypoattenuating focus in the anterior limb left internal capsule including sequela of prior lacunar type infarct. 4. Chronic microvascular angiopathy and parenchymal volume loss. Electronically Signed   By: Kreg Shropshire M.D.   On: 01/22/2021 20:02   CT Lumbar Spine Wo Contrast  Result Date: 01/22/2021 CLINICAL DATA:  Fall, low back pain, trauma EXAM: CT LUMBAR SPINE WITHOUT CONTRAST TECHNIQUE: Multidetector CT imaging of the lumbar spine was performed without intravenous contrast administration. Multiplanar CT image reconstructions were also generated. COMPARISON:  Radiograph 01/22/2021 FINDINGS: Segmentation: 5 normally formed lumbar type vertebral levels. Alignment: Preservation of the normal lumbar lordosis. Vertebrae: There is a biconvex compression deformity of the T12 vertebral body with acute transcortical lucency through the superior and inferior endplates as well as the anterior and posterior cortices technically qualifying this has a burst fracture deformity albeit with only minimal retropulsion of the superior endplate of approximately 2 mm. Scattered foci of gas within the fracture line likely related to the presence of nitrogenous gas in the T11-12 and T12-L1 disc spaces. No clear extension into the posterior tension band. No associated widening of the articular facets or spinous processes. No other acute fracture or osseous injury is identified. Mild diffuse bony demineralization. Discogenic and facet degenerative changes throughout the lumbar spine. Additional mild arthrosis at the bilateral SI joints with some enthesopathic changes on the iliac crests. Paraspinal and  other soft tissues: Mild paravertebral swelling and thickening adjacent the fracture line compatible with an acute injury. Additional included portions of posterior abdomen and pelvis demonstrate atherosclerotic calcification of the abdominal aorta and iliac arteries and a prior cholecystectomy without acute worrisome abnormality. Disc levels: Level by level evaluation of the lumbar spine below: T11-12: Nitrogenous gas within the disc space. Superior endplate deformity T12 with minimal 2 mm of retropulsion and small global  disc bulge with mild bilateral facet arthropathy. No significant resulting canal stenosis foraminal narrowing is seen. T12-L1: Disc height loss and desiccation without significant posterior disc abnormality. Mild bilateral facet arthropathy. No significant spinal canal or foraminal stenosis. L1-L2: Mild disc height loss and discogenic changes with mild bilateral facet arthropathy. No significant spinal canal or foraminal stenosis. L2-L3: Mild disc height loss and discogenic changes with mild facet arthropathy. Shallow global disc bulge. No significant spinal canal or foraminal stenosis. L3-L4: Mild disc height loss and moderate bilateral facet arthropathy. Shallow global disc bulge. No significant canal stenosis. Mild bilateral foraminal narrowing, right greater than left with partial effacement of the lateral recesses. L4-L5: Minimal disc height loss and discogenic changes and shallow global disc bulge with moderate bilateral facet arthropathy. Right subarticular disc protrusion. No significant canal stenosis or left foraminal narrowing. Mild right foraminal narrowing with partial effacement of the lateral recess. L5-S1: Mild disc height loss with shallow global disc bulge and moderate bilateral facet arthropathy. No significant spinal canal or foraminal stenosis. IMPRESSION: 1. Acute biconvex compression deformity of the T12 vertebral body with acute transcortical lucency through the superior  and inferior endplates as well as the anterior and posterior cortices technically qualifying this as a burst fracture (AO spine A4) albeit with only minimal 2 mm of retropulsion of the superior endplate of approximately 2 mm without resulting canal stenosis. No clear extension into the posterior tension band. Mild paravertebral swelling and thickening adjacent the fracture line compatible with an acute injury. Intervertebral gas likely related to intravasation of the degenerative nitrogenous gas phenomenon seen within the adjacent disc spaces. 2. Discogenic and facet degenerative changes throughout the lumbar spine as described above. 3. Aortic Atherosclerosis (ICD10-I70.0). Electronically Signed   By: Kreg ShropshirePrice  DeHay M.D.   On: 01/22/2021 20:18    Procedures Procedures   Medications Ordered in ED Medications  traMADol (ULTRAM) tablet 50 mg (has no administration in time range)  sodium chloride 0.9 % bolus 1,000 mL (1,000 mLs Intravenous New Bag/Given 01/22/21 1859)    ED Course  I have reviewed the triage vital signs and the nursing notes.  Pertinent labs & imaging results that were available during my care of the patient were reviewed by me and considered in my medical decision making (see chart for details).    MDM Rules/Calculators/A&P                         Iv ns. Stat labs and imaging.   Reviewed nursing notes and prior charts for additional history.   Labs reviewed/interpreted by me - glucose elevated 296. Corrected na 130-132. HCO3 normal.   Recent poor po intake. Ns bolus.   Ultram po for pain.   Xrays reviewed/interpreted by me - t12 fx.   CT reviewed/interpreted by me - no hem.+T12 fx - neurosurgery consulted.   Po fluids. No nv in ED, tolerating po, abd soft nt.   Discussed pt with NS, Dr Maisie Fushomas, who reviewed xrays and ct - he indicates order TLSO brace, and can f/u in office in two weeks for repeat imaging.  TLSO ordered.   On recheck, pain is improved/controlled. No  numbness/weakess.   Will also order toc/home health services.   Pt currently appears stable for d/c.      Final Clinical Impression(s) / ED Diagnoses Final diagnoses:  None    Rx / DC Orders ED Discharge Orders    None  Cathren Laine, MD 01/22/21 2214

## 2021-02-12 ENCOUNTER — Inpatient Hospital Stay (HOSPITAL_COMMUNITY): Payer: BLUE CROSS/BLUE SHIELD

## 2021-02-12 ENCOUNTER — Encounter (HOSPITAL_COMMUNITY): Payer: Self-pay

## 2021-02-12 ENCOUNTER — Emergency Department (HOSPITAL_COMMUNITY): Payer: BLUE CROSS/BLUE SHIELD

## 2021-02-12 ENCOUNTER — Inpatient Hospital Stay (HOSPITAL_COMMUNITY)
Admission: EM | Admit: 2021-02-12 | Discharge: 2021-03-04 | DRG: 853 | Disposition: A | Discharge status: Hospice | Payer: BLUE CROSS/BLUE SHIELD | Attending: Internal Medicine | Admitting: Internal Medicine

## 2021-02-12 ENCOUNTER — Other Ambulatory Visit: Payer: Self-pay

## 2021-02-12 DIAGNOSIS — A150 Tuberculosis of lung: Secondary | ICD-10-CM | POA: Diagnosis present

## 2021-02-12 DIAGNOSIS — M4854XA Collapsed vertebra, not elsewhere classified, thoracic region, initial encounter for fracture: Secondary | ICD-10-CM | POA: Diagnosis present

## 2021-02-12 DIAGNOSIS — A4189 Other specified sepsis: Secondary | ICD-10-CM | POA: Diagnosis not present

## 2021-02-12 DIAGNOSIS — J9621 Acute and chronic respiratory failure with hypoxia: Secondary | ICD-10-CM | POA: Diagnosis not present

## 2021-02-12 DIAGNOSIS — Z79899 Other long term (current) drug therapy: Secondary | ICD-10-CM | POA: Diagnosis not present

## 2021-02-12 DIAGNOSIS — R64 Cachexia: Secondary | ICD-10-CM | POA: Diagnosis present

## 2021-02-12 DIAGNOSIS — M546 Pain in thoracic spine: Secondary | ICD-10-CM | POA: Diagnosis not present

## 2021-02-12 DIAGNOSIS — E8809 Other disorders of plasma-protein metabolism, not elsewhere classified: Secondary | ICD-10-CM | POA: Diagnosis not present

## 2021-02-12 DIAGNOSIS — J9601 Acute respiratory failure with hypoxia: Secondary | ICD-10-CM | POA: Diagnosis not present

## 2021-02-12 DIAGNOSIS — Z7984 Long term (current) use of oral hypoglycemic drugs: Secondary | ICD-10-CM | POA: Diagnosis not present

## 2021-02-12 DIAGNOSIS — S22080D Wedge compression fracture of T11-T12 vertebra, subsequent encounter for fracture with routine healing: Secondary | ICD-10-CM | POA: Diagnosis not present

## 2021-02-12 DIAGNOSIS — A159 Respiratory tuberculosis unspecified: Secondary | ICD-10-CM | POA: Diagnosis present

## 2021-02-12 DIAGNOSIS — Z79890 Hormone replacement therapy (postmenopausal): Secondary | ICD-10-CM | POA: Diagnosis not present

## 2021-02-12 DIAGNOSIS — E440 Moderate protein-calorie malnutrition: Secondary | ICD-10-CM | POA: Diagnosis present

## 2021-02-12 DIAGNOSIS — E1165 Type 2 diabetes mellitus with hyperglycemia: Secondary | ICD-10-CM | POA: Diagnosis not present

## 2021-02-12 DIAGNOSIS — E1169 Type 2 diabetes mellitus with other specified complication: Secondary | ICD-10-CM | POA: Diagnosis present

## 2021-02-12 DIAGNOSIS — J159 Unspecified bacterial pneumonia: Secondary | ICD-10-CM | POA: Diagnosis not present

## 2021-02-12 DIAGNOSIS — Z978 Presence of other specified devices: Secondary | ICD-10-CM | POA: Diagnosis not present

## 2021-02-12 DIAGNOSIS — Z9889 Other specified postprocedural states: Secondary | ICD-10-CM

## 2021-02-12 DIAGNOSIS — R0600 Dyspnea, unspecified: Secondary | ICD-10-CM | POA: Diagnosis not present

## 2021-02-12 DIAGNOSIS — I340 Nonrheumatic mitral (valve) insufficiency: Secondary | ICD-10-CM | POA: Diagnosis not present

## 2021-02-12 DIAGNOSIS — Z681 Body mass index (BMI) 19 or less, adult: Secondary | ICD-10-CM | POA: Diagnosis not present

## 2021-02-12 DIAGNOSIS — E113512 Type 2 diabetes mellitus with proliferative diabetic retinopathy with macular edema, left eye: Secondary | ICD-10-CM | POA: Diagnosis not present

## 2021-02-12 DIAGNOSIS — E222 Syndrome of inappropriate secretion of antidiuretic hormone: Secondary | ICD-10-CM | POA: Diagnosis not present

## 2021-02-12 DIAGNOSIS — R634 Abnormal weight loss: Secondary | ICD-10-CM | POA: Diagnosis not present

## 2021-02-12 DIAGNOSIS — E861 Hypovolemia: Secondary | ICD-10-CM | POA: Diagnosis not present

## 2021-02-12 DIAGNOSIS — F419 Anxiety disorder, unspecified: Secondary | ICD-10-CM | POA: Diagnosis not present

## 2021-02-12 DIAGNOSIS — S22080A Wedge compression fracture of T11-T12 vertebra, initial encounter for closed fracture: Secondary | ICD-10-CM | POA: Diagnosis present

## 2021-02-12 DIAGNOSIS — Z20822 Contact with and (suspected) exposure to covid-19: Secondary | ICD-10-CM | POA: Diagnosis present

## 2021-02-12 DIAGNOSIS — E890 Postprocedural hypothyroidism: Secondary | ICD-10-CM | POA: Diagnosis present

## 2021-02-12 DIAGNOSIS — I5021 Acute systolic (congestive) heart failure: Secondary | ICD-10-CM | POA: Diagnosis not present

## 2021-02-12 DIAGNOSIS — E873 Alkalosis: Secondary | ICD-10-CM | POA: Diagnosis not present

## 2021-02-12 DIAGNOSIS — A310 Pulmonary mycobacterial infection: Secondary | ICD-10-CM | POA: Diagnosis not present

## 2021-02-12 DIAGNOSIS — A419 Sepsis, unspecified organism: Secondary | ICD-10-CM | POA: Diagnosis not present

## 2021-02-12 DIAGNOSIS — Z66 Do not resuscitate: Secondary | ICD-10-CM | POA: Diagnosis not present

## 2021-02-12 DIAGNOSIS — E785 Hyperlipidemia, unspecified: Secondary | ICD-10-CM | POA: Diagnosis present

## 2021-02-12 DIAGNOSIS — J188 Other pneumonia, unspecified organism: Secondary | ICD-10-CM | POA: Diagnosis not present

## 2021-02-12 DIAGNOSIS — J939 Pneumothorax, unspecified: Secondary | ICD-10-CM | POA: Diagnosis present

## 2021-02-12 DIAGNOSIS — R0603 Acute respiratory distress: Secondary | ICD-10-CM

## 2021-02-12 DIAGNOSIS — Z515 Encounter for palliative care: Secondary | ICD-10-CM | POA: Diagnosis not present

## 2021-02-12 DIAGNOSIS — L8990 Pressure ulcer of unspecified site, unspecified stage: Secondary | ICD-10-CM | POA: Insufficient documentation

## 2021-02-12 DIAGNOSIS — R6520 Severe sepsis without septic shock: Secondary | ICD-10-CM | POA: Diagnosis not present

## 2021-02-12 DIAGNOSIS — J440 Chronic obstructive pulmonary disease with acute lower respiratory infection: Secondary | ICD-10-CM | POA: Diagnosis not present

## 2021-02-12 DIAGNOSIS — J984 Other disorders of lung: Secondary | ICD-10-CM | POA: Diagnosis not present

## 2021-02-12 DIAGNOSIS — F17210 Nicotine dependence, cigarettes, uncomplicated: Secondary | ICD-10-CM | POA: Diagnosis present

## 2021-02-12 DIAGNOSIS — R0602 Shortness of breath: Secondary | ICD-10-CM | POA: Diagnosis present

## 2021-02-12 DIAGNOSIS — R0902 Hypoxemia: Secondary | ICD-10-CM

## 2021-02-12 DIAGNOSIS — J181 Lobar pneumonia, unspecified organism: Secondary | ICD-10-CM | POA: Diagnosis present

## 2021-02-12 DIAGNOSIS — J9311 Primary spontaneous pneumothorax: Secondary | ICD-10-CM

## 2021-02-12 DIAGNOSIS — Z7189 Other specified counseling: Secondary | ICD-10-CM | POA: Diagnosis not present

## 2021-02-12 DIAGNOSIS — J948 Other specified pleural conditions: Secondary | ICD-10-CM | POA: Diagnosis not present

## 2021-02-12 DIAGNOSIS — E113599 Type 2 diabetes mellitus with proliferative diabetic retinopathy without macular edema, unspecified eye: Secondary | ICD-10-CM | POA: Diagnosis present

## 2021-02-12 DIAGNOSIS — Z794 Long term (current) use of insulin: Secondary | ICD-10-CM | POA: Diagnosis not present

## 2021-02-12 DIAGNOSIS — D638 Anemia in other chronic diseases classified elsewhere: Secondary | ICD-10-CM | POA: Diagnosis present

## 2021-02-12 HISTORY — DX: Pneumothorax, unspecified: J93.9

## 2021-02-12 HISTORY — DX: Respiratory tuberculosis unspecified: A15.9

## 2021-02-12 HISTORY — DX: Abnormal weight loss: R63.4

## 2021-02-12 LAB — CBC WITH DIFFERENTIAL/PLATELET
Abs Immature Granulocytes: 0.05 10*3/uL (ref 0.00–0.07)
Basophils Absolute: 0 10*3/uL (ref 0.0–0.1)
Basophils Relative: 0 %
Eosinophils Absolute: 0.1 10*3/uL (ref 0.0–0.5)
Eosinophils Relative: 1 %
HCT: 38.8 % (ref 36.0–46.0)
Hemoglobin: 12.4 g/dL (ref 12.0–15.0)
Immature Granulocytes: 1 %
Lymphocytes Relative: 15 %
Lymphs Abs: 1.4 10*3/uL (ref 0.7–4.0)
MCH: 26.7 pg (ref 26.0–34.0)
MCHC: 32 g/dL (ref 30.0–36.0)
MCV: 83.4 fL (ref 80.0–100.0)
Monocytes Absolute: 1 10*3/uL (ref 0.1–1.0)
Monocytes Relative: 10 %
Neutro Abs: 6.8 10*3/uL (ref 1.7–7.7)
Neutrophils Relative %: 73 %
Platelets: 578 10*3/uL — ABNORMAL HIGH (ref 150–400)
RBC: 4.65 MIL/uL (ref 3.87–5.11)
RDW: 14.6 % (ref 11.5–15.5)
WBC: 9.3 10*3/uL (ref 4.0–10.5)
nRBC: 0 % (ref 0.0–0.2)

## 2021-02-12 LAB — BASIC METABOLIC PANEL
Anion gap: 14 (ref 5–15)
BUN: 8 mg/dL (ref 8–23)
CO2: 22 mmol/L (ref 22–32)
Calcium: 9.2 mg/dL (ref 8.9–10.3)
Chloride: 97 mmol/L — ABNORMAL LOW (ref 98–111)
Creatinine, Ser: 0.5 mg/dL (ref 0.44–1.00)
GFR, Estimated: >60 mL/min (ref >60)
Glucose, Bld: 243 mg/dL — ABNORMAL HIGH (ref 70–99)
Potassium: 4.3 mmol/L (ref 3.5–5.1)
Sodium: 133 mmol/L — ABNORMAL LOW (ref 135–145)

## 2021-02-12 LAB — TROPONIN I (HIGH SENSITIVITY)
Troponin I (High Sensitivity): 9 ng/L (ref <18)
Troponin I (High Sensitivity): 11 ng/L (ref <18)

## 2021-02-12 LAB — RESP PANEL BY RT-PCR (FLU A&B, COVID) ARPGX2
Influenza A by PCR: NEGATIVE
Influenza B by PCR: NEGATIVE
SARS Coronavirus 2 by RT PCR: NEGATIVE

## 2021-02-12 MED ORDER — LEVOTHYROXINE SODIUM 50 MCG PO TABS
50.0000 ug | ORAL_TABLET | Freq: Every day | ORAL | Status: DC
  Administered 2021-02-13 – 2021-03-04 (×20): 50 ug via ORAL
  Filled 2021-02-12 (×21): qty 1

## 2021-02-12 MED ORDER — ENOXAPARIN SODIUM 40 MG/0.4ML ~~LOC~~ SOLN: 40.0000 mg | SUBCUTANEOUS | Status: DC

## 2021-02-12 MED ORDER — ACETAMINOPHEN 325 MG PO TABS
650.0000 mg | ORAL_TABLET | Freq: Four times a day (QID) | ORAL | Status: DC | PRN
  Administered 2021-02-13 – 2021-02-17 (×4): 650 mg via ORAL
  Filled 2021-02-12 (×5): qty 2

## 2021-02-12 MED ORDER — SODIUM CHLORIDE 0.9 % IV SOLN: INTRAVENOUS | Status: AC

## 2021-02-12 MED ORDER — LIDOCAINE 5 % EX PTCH
1.0000 | MEDICATED_PATCH | CUTANEOUS | Status: DC
  Administered 2021-02-12 – 2021-03-02 (×19): 1 via TRANSDERMAL
  Filled 2021-02-12 (×21): qty 1

## 2021-02-12 MED ORDER — VITAMIN D 25 MCG (1000 UNIT) PO TABS
1000.0000 [IU] | ORAL_TABLET | Freq: Every day | ORAL | Status: DC
  Administered 2021-02-13 – 2021-03-04 (×19): 1000 [IU] via ORAL
  Filled 2021-02-12 (×19): qty 1

## 2021-02-12 MED ORDER — LIDOCAINE HCL (PF) 1 % IJ SOLN
20.0000 mL | Freq: Once | INTRAMUSCULAR | Status: AC
  Administered 2021-02-12: 20 mL
  Filled 2021-02-12: qty 20

## 2021-02-12 MED ORDER — ACETAMINOPHEN 650 MG RE SUPP: 650.0000 mg | Freq: Four times a day (QID) | RECTAL | Status: DC | PRN

## 2021-02-12 MED ORDER — FENTANYL CITRATE (PF) 100 MCG/2ML IJ SOLN
100.0000 ug | Freq: Once | INTRAMUSCULAR | Status: AC
  Administered 2021-02-12: 100 ug via INTRAVENOUS
  Filled 2021-02-12: qty 2

## 2021-02-12 MED ORDER — ATORVASTATIN CALCIUM 10 MG PO TABS
10.0000 mg | ORAL_TABLET | Freq: Every evening | ORAL | Status: DC
  Administered 2021-02-12 – 2021-03-03 (×20): 10 mg via ORAL
  Filled 2021-02-12 (×20): qty 1

## 2021-02-12 MED ORDER — CALCITONIN (SALMON) 200 UNIT/ACT NA SOLN
1.0000 | Freq: Every day | NASAL | Status: DC
  Administered 2021-02-13 – 2021-03-04 (×20): 1 via NASAL
  Filled 2021-02-12 (×2): qty 3.7

## 2021-02-12 MED ORDER — MORPHINE SULFATE (PF) 4 MG/ML IV SOLN
3.0000 mg | Freq: Once | INTRAVENOUS | Status: AC
  Administered 2021-02-12: 3 mg via INTRAVENOUS
  Filled 2021-02-12: qty 1

## 2021-02-12 MED ORDER — TRAMADOL HCL 50 MG PO TABS
50.0000 mg | ORAL_TABLET | Freq: Three times a day (TID) | ORAL | Status: DC | PRN
  Administered 2021-02-14 – 2021-03-04 (×4): 50 mg via ORAL
  Filled 2021-02-12 (×4): qty 1

## 2021-02-12 MED ORDER — IOHEXOL 350 MG/ML SOLN
35.0000 mL | Freq: Once | INTRAVENOUS | Status: AC | PRN
  Administered 2021-02-12: 35 mL via INTRAVENOUS

## 2021-02-12 MED ORDER — MORPHINE SULFATE (PF) 2 MG/ML IV SOLN
2.0000 mg | INTRAVENOUS | Status: DC | PRN
Start: 2021-02-12 — End: 2021-03-04
  Administered 2021-02-14 – 2021-02-25 (×11): 2 mg via INTRAVENOUS
  Filled 2021-02-12 (×12): qty 1

## 2021-02-12 NOTE — ED Triage Notes (Signed)
Pt arrived via EMS from PCP w/ c/o pneumothorax. Pt had recent fall on the 25th and was seen here. Pneumothorax confirmed via CXR at PCP. EMS reports pt has a fx in her back, pt's husband unable to specify where and that it was from the fall on the 25th. Pt is reporting shob. Per EMS pt maintaining 96% RA. L side breath sounds absent and clear on the the R. Cough x 2 months and pt reports shob x 2 months. VS: CBG 305, 108/80, HR 116, 99.9 tympanic, 96% RA

## 2021-02-12 NOTE — ED Notes (Signed)
X-ray at bedside

## 2021-02-12 NOTE — H&P (Addendum)
History and Physical    [name] [name] ZOX:[id number] DOB: [birth year] DOA: 02/12/2021  PCP: Truett PernaWang, Yun, MD  Patient coming from: Home.  Lives with her husband.  I have personally briefly reviewed patient's [facility]  Chief Complaint: Pneumothorax  HPI:  [name] BassetLyudmila Wanless is a 65 y.o. female with a PMH significant for type 2 diabetes, recent vertebral fracture, tobacco use, hyperlipidemia, hypothyroidism.  Ayanna initially saw her PCP last week for shortness of breath and was scheduled for a chest x-ray which took place earlier today.  She presented to the emergency room from the imaging center via ambulance due to the pneumothorax seen on chest x-ray. In the ED, it was found that they had approximately 90% left tension hydropneumothorax with severe compression and atelectasis of the lung medially and marked mediastinal shift to the right, normal oxygen saturation on room air and not in respiratory distress. They were treated with left chest tube and 2 L oxygen via nasal cannula.  Her vital signs remained stable and interval chest x-ray showed decreased shift. Patient was admitted to medicine service for further workup and management of chronic medical conditions as outlined in detail below.  Additionally, patient endorses 1/2 pack/day for 30-year smoking history.  She stopped smoking 2 years ago.  Denies having any mammograms or colonoscopies and declines those cancer screenings at this time.  Patient has had recent unintentional weight loss and she attributes this to her back pain from recent fall.  Patient was treated approximately 3 weeks ago for vertebral compression fracture after fall in the bathroom.  At baseline, patient can ambulate and perform ADLs without difficulty.  Review of Systems  Constitutional: Positive for weight loss. Negative for fever.  Respiratory: Positive for cough and shortness of breath.   Cardiovascular: Positive for chest pain and  orthopnea. Negative for leg swelling.  Gastrointestinal: Negative for abdominal pain, blood in stool, constipation, diarrhea, melena, nausea and vomiting.  Genitourinary: Negative for dysuria.   Review of Systems: As per HPI otherwise 10 point review of systems negative.   Past Medical History:  Diagnosis Date  . Compression fracture of T12 vertebra (HCC) 01/22/2021  . Pneumothorax on left 02/12/2021  . Weight loss    History reviewed. No pertinent surgical history.   reports that she quit smoking about 17 months ago. Her smoking use included cigarettes. She has never used smokeless tobacco. She reports previous alcohol use. She reports that she does not use drugs.  No Known Allergies  History reviewed. No pertinent family history.  Prior to Admission medications   Medication Sig Start Date End Date Taking? Authorizing Provider  atorvastatin (LIPITOR) 10 MG tablet Take 10 mg by mouth every evening. 01/12/21  Yes [provider]  Cholecalciferol (VITAMIN D3 PO) Take 1 tablet by mouth daily.   Yes [provider]  Continuous Blood Gluc Receiver (FREESTYLE LIBRE READER) DEVI 1 Device by Does not apply route as directed. 03/07/20  Yes [place], Betty G, MD  Continuous Blood Gluc Sensor (FREESTYLE LIBRE SENSOR SYSTEM) MISC 1 Device by Does not apply route 3 (three) times daily as needed. 03/07/20  Yes [place], Betty G, MD  Cyanocobalamin (B-12 PO) Take 1 tablet by mouth daily.   Yes [provider]  glimepiride (AMARYL) 2 MG tablet Take 2 mg by mouth daily with breakfast. 02/02/21  Yes [provider]  levothyroxine (SYNTHROID) 50 MCG tablet Take 50 mcg by mouth daily before breakfast.   Yes [provider]  metFORMIN (GLUCOPHAGE) 1000 MG tablet Take 1,000 mg by mouth 2 (two) times daily with a meal. 06/27/19  Yes [provider]  traMADol (ULTRAM) 50 MG tablet Take 1 tablet (50 mg total) by mouth every 8 (eight) hours as needed. 01/22/21  Yes  Cathren Laine, MD   Physical Exam: Vitals:   02/12/21 1510 02/12/21 1515 02/12/21 1530 02/12/21 1545  BP: (!) 146/81 131/89 (!) 141/78 122/82  Pulse: 94 92 96 91  Resp: (!) 23 (!) 27 (!) 32 (!) 25  Temp:      TempSrc:      SpO2: 98% 98% 97% 98%  Weight:      Height:        Constitutional: NAD, calm, comfortable, frail Eyes: PERRL, lids and conjunctivae normal ENMT: Mucous membranes are moist. Posterior pharynx clear of any exudate or lesions.Normal dentition.  Neck: normal, supple, no masses, no thyromegaly Respiratory: Decreased lung sounds on left and taking shallow breaths. Normal respiratory effort. positive accessory muscle use.  Intermittent cough present.  Able to speak short sentences.  Currently on 2 L [place], left-sided chest tube with clean cover dressing present. Cardiovascular: RRR, no murmurs / rubs / gallops. No extremity edema. 2+ pedal pulses. Abdomen: soft, NT, ND, no masses or HSM palpated. +Bowel sounds.  Musculoskeletal: no clubbing / cyanosis. No joint deformity upper and lower extremities. Normal muscle tone.  Skin: dry, intact, normal color, normal temperature Neurologic: Normal speech.  Grossly non-focal exam. Psychiatric: Alert and oriented x 3. Normal mood. Congruent affect.  Normal judgement and insight.  Labs on Admission: I have personally reviewed following labs and imaging studies Pertinent labs: CBC: Recent Labs  Lab 02/12/21 1336  WBC 9.3  NEUTROABS 6.8  HGB 12.4  HCT 38.8  MCV 83.4  PLT 578*   Basic Metabolic Panel: Recent Labs  Lab 02/12/21 1336  NA 133*  K 4.3  CL 97*  CO2 22  GLUCOSE 243*  BUN 8  CREATININE 0.50  CALCIUM 9.2   GFR: Estimated Creatinine Clearance: 68.3 mL/min (by C-G formula based on SCr of 0.5 mg/dL).  Urine analysis:    Component Value Date/Time   COLORURINE YELLOW 01/22/2021 1311   APPEARANCEUR CLEAR 01/22/2021 1311   LABSPEC 1.020 01/22/2021 1311   PHURINE 6.0 01/22/2021 1311   GLUCOSEU 250 (A)  01/22/2021 1311   HGBUR TRACE (A) 01/22/2021 1311   HGBUR negative 01/19/2008 0750   BILIRUBINUR NEGATIVE 01/22/2021 1311   KETONESUR NEGATIVE 01/22/2021 1311   PROTEINUR 100 (A) 01/22/2021 1311   UROBILINOGEN 0.2 01/19/2008 0750   NITRITE NEGATIVE 01/22/2021 1311   LEUKOCYTESUR NEGATIVE 01/22/2021 1311   Radiological Exams on Admission: DG Chest Portable 1 View  Result Date: 02/12/2021 CLINICAL DATA:  Chest tube placement EXAM: PORTABLE CHEST 1 VIEW COMPARISON:  02/12/2021 FINDINGS: Interval placement of left-sided chest tube with pigtail at the left lateral mid chest. Decreased size of left pneumothorax with decreased mediastinal shift to the right. Possible small loculated air collection within the left upper lung. Small left effusion. Airspace disease throughout the left lung. Slightly nodular appearing opacities in the right upper and lower lung. Probable bullous change at the left apex. Aortic atherosclerosis. IMPRESSION: 1. Interval placement of left-sided chest tube with decreased left pneumothorax and decreased mediastinal shift to the right. Airspace disease throughout the left lung which may reflect edema or diffuse pneumonia, with small left effusion. Possible small loculated air collection within the left upper lung. 2. Nodular appearing opacities in the right upper and  lower lung, question small nodules or nodular infiltrates. CT chest could better evaluate this finding. Electronically Signed   By: Jasmine Pang M.D.   On: 02/12/2021 16:11   DG Chest Portable 1 View  Addendum Date: 02/12/2021   ADDENDUM REPORT: 02/12/2021 14:08 ADDENDUM: Critical Value/emergent results were called by telephone at the time of interpretation on 02/12/2021 at 2:08 pm to provider MATTHEW TRIFAN , who verbally acknowledged these results. Electronically Signed   By: Beckie Salts M.D.   On: 02/12/2021 14:08   Result Date: 02/12/2021 CLINICAL DATA:  Follow-up large left tension pneumothorax. EXAM: PORTABLE CHEST  1 VIEW COMPARISON:  Earlier today at [facility]. FINDINGS: An approximately 90% left hydropneumothorax with severe compression and atelectasis of the lung medially and marked mediastinal shift to the right is again demonstrated. The heart remains normal in size. Vascular crowding in the right lung without significant change. Diffuse osteopenia. IMPRESSION: Approximately 90% left tension hydropneumothorax with severe compression and atelectasis of the lung medially and marked mediastinal shift to the right, unchanged since earlier today. Electronically Signed: By: Beckie Salts M.D. On: 02/12/2021 14:00   EKG: Independently reviewed.  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  Spontaneous left tension pneumothorax-s/p left pigtail.  Vitals stable.  High likelihood of relationship to malignancy.  Would recommend chest CT to further evaluate for primary lung malignancy when further recovered from acute condition. -[facility] consulted and managing pneumothorax -Continuous oxygen therapy  Poorly controlled noninsulin-dependent type 2 diabetes- last hemoglobin A1c 03/07/2020 was 13.7.  Glucose elevated to 243 on admission.  Patient states that she does not take insulin.  Endorses adherence with atorvastatin, glimepiride, Metformin -Repeat hemoglobin A1c a.m. -SSSI -Continue atorvastatin  Hypothyroidism-Home medications include levothyroxine 50 mcg -Continue home medication  Thoracic vertebrae compression fracture-present on admission. -Tylenol as needed for pain -Nasal calcitonin -Continue home tramadol PRN - PT/OT  Mild  hyponatremia-patient currently asymptomatic.  Possibly related to poor p.o. intake consider SIADH -BMP a.m. -IV maintenance fluids x8 hours and encourage oral hydration  DVT prophylaxis: Lovenox Code Status: Full Family Communication: Husband at bedside Disposition Plan: Determined by [facility] Consults called: [facility]  Admission status:  It is my clinical opinion that referral for OBSERVATION is reasonable and necessary in this patient based on the above information provided. The aforementioned taken together are felt to place the patient at high risk for further clinical deterioration. However it is anticipated that the patient may be medically stable for discharge from the hospital within 24 to 48 hours.  Leeroy Bock, DO [facility]  02/12/2021, 4:36 PM   If 7PM-7AM, please contact night-coverage. How to contact the [facility] Attending or Consulting provider 7A - 7P or covering provider during after hours 7P -7A, for this patient?    1. Check the care team in [facility] and look for a) attending/consulting TR[facility] provider listed and b) the [facility] team listed 2. Log into [email or web address] and use [place]'s universal password to access. If you do not have the password, please contact the hospital operator. 3. Locate the [facility] provider you are looking for under [facility] and page to a number that you can be directly reached. 4. If  you still have difficulty reaching the provider, please page the [facility] (Director on Call) for the Hospitalists listed on amion for assistance.   Addendum:  Patient was seen, examined,treatment plan was discussed with the Resident.  I have personally reviewed the clinical findings, labs, EKG, imaging studies and management of this patient in detail. I have also reviewed the orders written for this patient which were under my direction. I agree with the documentation, as recorded by the Advance Practice Provider.   Kioni Stahl is a 65 y.o. female presenting with SOB,  found to have a large left-sided pneumothorax.  Chest tube was placed with improvement in imaging, anticipate several days of inpatient hospitalization so will admit at this time.  She also had a recent T12 compression fracture (2/10) and is still having significant pain from this.  Will add morphine and lidoderm patch for this issue.  There is a definite concern for underlying malignancy (likely lung) and so she will need further evaluation once her pneumothorax is more stable.  She is currently breathing comfortably on [place] O2.     Jonah Blue, MD

## 2021-02-12 NOTE — ED Provider Notes (Signed)
[facility] EMERGENCY DEPARTMENT Provider Note   CSN: [id number] Arrival date & time: 02/12/21  1248     History Chief Complaint  Patient presents with  . Pneumothorax  . Shortness of Breath    [name] [name] is a 65 y.o. female w/ hx of diabetes type 2, presenting to ED with SOB and chest pain.  She was at her primary care doctor's office today complaining of shortness of breath and had an x-ray which showed a large left-sided pneumothorax.  She was brought by EMS emergently to the ER.  She reports that she be feeling short of breath for several weeks or months.  She has had increasing chest discomfort for the past few days.  She denies lightheadedness or shortness of breath.  She was seen in our emergency department approximate 2 to 3 weeks ago after mechanical fall, found to have a T-spine compression fracture at that time.  She did not have imaging of her chest.  She is not on blood thinners.  She reports weight loss over past several months. No hx of PTX.  HPI     Past Medical History:  Diagnosis Date  . Compression fracture of T12 vertebra ([place]) 01/22/2021  . Pneumothorax on left 02/12/2021  . Weight loss     Patient Active Problem List   Diagnosis Date Noted  . Pneumothorax on left 02/12/2021  . Weight loss   . Compression fracture of T12 vertebra ([place]) 01/22/2021  . Type II diabetes mellitus with proliferative retinopathy ([place]) 03/07/2020  . Varicose vein of leg 03/07/2020  . B12 deficiency 03/07/2020  . Hyperlipidemia associated with type 2 diabetes mellitus ([place]) 03/07/2020  . RHUS DERMATITIS 03/23/2008  . FIBROCYSTIC BREAST DISEASE 03/01/2008  . FOLLICULITIS 99/24/2683  . 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 ACNE, ROSACEA 12/25/2007  . THYROID MASS 09/07/2007    History reviewed. No pertinent surgical history.   OB History   No obstetric history on file.     History reviewed. No pertinent family history.  Social History    Tobacco Use  . Smoking status: Former Smoker    Types: Cigarettes    Quit date: 08/28/2019    Years since quitting: 1.4  . Smokeless tobacco: Never Used  Substance Use Topics  . Alcohol use: Not Currently  . Drug use: Never    Home Medications Prior to Admission medications   Medication Sig Start Date End Date Taking? Authorizing Provider  atorvastatin (LIPITOR) 10 MG tablet Take 10 mg by mouth every evening. 01/12/21  Yes [provider]  Cholecalciferol (VITAMIN D3 PO) Take 1 tablet by mouth daily.   Yes [provider]  Continuous Blood Gluc Receiver (FREESTYLE LIBRE READER) DEVI 1 Device by Does not apply route as directed. 03/07/20  Yes [place], Betty G, MD  Continuous Blood Gluc Sensor (FREESTYLE LIBRE SENSOR SYSTEM) MISC 1 Device by Does not apply route 3 (three) times daily as needed. 03/07/20  Yes [place], Betty G, MD  Cyanocobalamin (B-12 PO) Take 1 tablet by mouth daily.   Yes [provider]  glimepiride (AMARYL) 2 MG tablet Take 2 mg by mouth daily with breakfast. 02/02/21  Yes [provider]  levothyroxine (SYNTHROID) 50 MCG tablet Take 50 mcg by mouth daily before breakfast.   Yes [provider]  metFORMIN (GLUCOPHAGE) 1000 MG tablet Take 1,000 mg by mouth 2 (two) times daily with a meal. 06/27/19  Yes [provider]  traMADol (ULTRAM) 50 MG tablet Take 1 tablet (50  mg total) by mouth every 8 (eight) hours as needed. 01/22/21  Yes Lajean Saver, MD    Allergies    Patient has no known allergies.  Review of Systems   Review of Systems  Constitutional: Negative for chills and fever.  HENT: Negative for ear pain and sore throat.   Eyes: Negative for pain and visual disturbance.  Respiratory: Positive for shortness of breath. Negative for cough.   Cardiovascular: Positive for chest pain. Negative for palpitations.  Gastrointestinal: Negative for abdominal pain and vomiting.  Genitourinary: Negative for dysuria and  hematuria.  Musculoskeletal: Positive for back pain. Negative for arthralgias and myalgias.  Skin: Negative for color change and rash.  Neurological: Negative for syncope and light-headedness.  All other systems reviewed and are negative.   Physical Exam Updated Vital Signs BP (!) 141/78   Pulse 96   Temp 97.9 F (36.6 C) (Oral)   Resp (!) 32   Ht _0  (1.676 m)   Wt 61.2 kg   SpO2 97%   BMI 21.78 kg/m   Physical Exam Constitutional:      General: She is not in acute distress.    Comments: Thin appearing  HENT:     Head: Normocephalic and atraumatic.  Eyes:     Conjunctiva/sclera: Conjunctivae normal.     Pupils: Pupils are equal, round, and reactive to light.  Cardiovascular:     Rate and Rhythm: Normal rate and regular rhythm.  Pulmonary:     Effort: Pulmonary effort is normal. No respiratory distress.     Comments: 98% on room air Absent breath sounds left sided Abdominal:     General: There is no distension.     Tenderness: There is no abdominal tenderness.  Skin:    General: Skin is warm and dry.  Neurological:     General: No focal deficit present.     Mental Status: She is alert and oriented to person, place, and time. Mental status is at baseline.  Psychiatric:        Mood and Affect: Mood normal.        Behavior: Behavior normal.     ED Results / Procedures / Treatments   Labs (all labs ordered are listed, but only abnormal results are displayed) Labs Reviewed  BASIC METABOLIC PANEL - Abnormal; Notable for the following components:      Result Value   Sodium 133 (*)    Chloride 97 (*)    Glucose, Bld 243 (*)    All other components within normal limits  CBC WITH DIFFERENTIAL/PLATELET - Abnormal; Notable for the following components:   Platelets 578 (*)    All other components within normal limits  RESP PANEL BY RT-PCR (FLU A&B, COVID) ARPGX2  TROPONIN I (HIGH SENSITIVITY)  TROPONIN I (HIGH SENSITIVITY)    EKG EKG  Interpretation  Date/Time:  Thursday February 12 2021 13:01:34 EST Ventricular Rate:  118 PR Interval:    QRS Duration: 67 QT Interval:  297 QTC Calculation: 417 R Axis:   33 Text Interpretation: Sinus tachycardia Probable left atrial enlargement Probable left ventricular hypertrophy No STEMI Confirmed by Octaviano Glow [phone]) on 02/12/2021 2:06:35 PM   Radiology DG Chest Portable 1 View  Addendum Date: 02/12/2021   ADDENDUM REPORT: 02/12/2021 14:08 ADDENDUM: Critical Value/emergent results were called by telephone at the time of interpretation on 02/12/2021 at 2:08 pm to provider Alla Sloma , who verbally acknowledged these results. Electronically Signed   By: Claudie Revering M.D.   On:  02/12/2021 14:08   Result Date: 02/12/2021 CLINICAL DATA:  Follow-up large left tension pneumothorax. EXAM: PORTABLE CHEST 1 VIEW COMPARISON:  Earlier today at [facility]. FINDINGS: An approximately 90% left hydropneumothorax with severe compression and atelectasis of the lung medially and marked mediastinal shift to the right is again demonstrated. The heart remains normal in size. Vascular crowding in the right lung without significant change. Diffuse osteopenia. IMPRESSION: Approximately 90% left tension hydropneumothorax with severe compression and atelectasis of the lung medially and marked mediastinal shift to the right, unchanged since earlier today. Electronically Signed: By: Claudie Revering M.D. On: 02/12/2021 14:00      Medications Ordered in ED Medications  morphine 4 MG/ML injection 3 mg (has no administration in time range)  fentaNYL (SUBLIMAZE) injection 100 mcg (100 mcg Intravenous Given 02/12/21 1436)  lidocaine (PF) (XYLOCAINE) 1 % injection 20 mL (20 mLs Other Given by Other 02/12/21 1435)    ED Course  I have reviewed the triage vital signs and the nursing notes.  Pertinent labs & imaging results that were available during my care of the patient were reviewed by me and considered in my  medical decision making (see chart for details).  65 year old female presenting to the ED with concern for shortness of breath, found to have large left sided PTX.  Pigtail placed in the ED, vitals stable CT surgery consulted - advised admission for observation Tube to suction Repeat xray pending  IV morphine ordered for back pain - likely 2/2 compression fx.  It is positional and she states bed is uncomfortable.  [place] Oceanographer used for conversation Patient's husband at bedside.   Clinical Course as of 02/12/21 1541  Thu Feb 12, 2021  1337 Large left PTX noted.  Vitals stable.  Will discuss with CT surgery, advised nursing to get pigtail catheter kit to bedside [MT]  91 Pt and husband consented for pigtail placement with russian translator services used. [MT]  3276 Pigtail tube placed, repeat xray pending.  Dr Ricard Dillon from [facility] surgery at bedside.  Paged unassigned for admission.  Patient confirms PCP is at [facility]. [MT]    Clinical Course User Index [MT] Klayten Jolliff, Carola Rhine, MD    Final Clinical Impression(s) / ED Diagnoses Final diagnoses:  Pneumothorax, unspecified type    Rx / DC Orders ED Discharge Orders    None       Wyvonnia Dusky, MD 02/12/21 1715

## 2021-02-12 NOTE — ED Notes (Signed)
Consents obtained for pig tail insertion.

## 2021-02-12 NOTE — Consult Note (Addendum)
[address]       [place] [address]             [phone]        [facility] Medical Record #[id number] Date of Birth: 1956-08-31  Referring: No ref. provider found Primary Care: Truett Perna, MD Primary Cardiologist:No primary care provider on file.  Chief Complaint:    Chief Complaint  Patient presents with  . Pneumothorax  . Shortness of Breath    History of Present Illness:      Ms. Rena is a 65 year old patient who presents to [facility] ED with shortness of breath and was found to have a pneumothorax s/p a fall two weeks ago when she slipped on a wet bathroom floor, fell and hit her head. Her past medical history is also significant for a 15kg weight loss, lack of appetite, hypothyroidism on synthroid, Diabetes Mellitus type 2, and a T12 vertebra compression fracture. She does state that she saw a physician for this however there is no documentation in epic.  She quit smoking two years ago and does not have a history of COPD or asthma. Today, her only complaint is back pain and she is having a difficult time getting comfortable. She did have a left pigtail catheter which was inserted by Dr. Dareen Piano. Recent CXR shows: Interval placement of left-sided chest tube with decreased left pneumothorax and decreased mediastinal shift to the right.  Possible small loculated air collection within the left upper lung.  Nodular appearing opacities in the right upper and right lower lung question small nodules or nodular infiltrates.  A chest CT was ordered for further study. We are consulted for chest tube management and the hospitalist service will be admitting the patient.      Current Activity/ Functional Status: Patient is not independent with mobility/ambulation, transfers, ADL's, IADL's.   Zubrod Score: At the time of surgery this patient's most appropriate activity status/level should be described as: []     0    Normal activity, no  symptoms []     1    Restricted in physical strenuous activity but ambulatory, able to do out light work [x]     2    Ambulatory and capable of self care, unable to do work activities, up and about                 more than 50%  Of the time                            []     3    Only limited self care, in bed greater than 50% of waking hours []     4    Completely disabled, no self care, confined to bed or chair []     5    Moribund  Past Medical History:  Diagnosis Date  . Compression fracture of T12 vertebra (HCC) 01/22/2021  . Pneumothorax on left 02/12/2021  . Weight loss     History reviewed. No pertinent surgical history.  Social History   Tobacco Use  Smoking Status Former Smoker  . Types: Cigarettes  . Quit date: 08/28/2019  . Years since quitting: 1.4  Smokeless Tobacco Never Used    Social History   Substance and Sexual Activity  Alcohol Use Not Currently     No Known Allergies  No current facility-administered medications for this encounter.   Current Outpatient  Medications  Medication Sig Dispense Refill  . atorvastatin (LIPITOR) 10 MG tablet Take 10 mg by mouth every evening.    . Cholecalciferol (VITAMIN D3 PO) Take 1 tablet by mouth daily.    . Continuous Blood Gluc Receiver (FREESTYLE LIBRE READER) DEVI 1 Device by Does not apply route as directed. 6 each 3  . Continuous Blood Gluc Sensor (FREESTYLE LIBRE SENSOR SYSTEM) MISC 1 Device by Does not apply route 3 (three) times daily as needed. 6 each 2  . Cyanocobalamin (B-12 PO) Take 1 tablet by mouth daily.    Marland Kitchen glimepiride (AMARYL) 2 MG tablet Take 2 mg by mouth daily with breakfast.    . levothyroxine (SYNTHROID) 50 MCG tablet Take 50 mcg by mouth daily before breakfast.    . metFORMIN (GLUCOPHAGE) 1000 MG tablet Take 1,000 mg by mouth 2 (two) times daily with a meal.    . traMADol (ULTRAM) 50 MG tablet Take 1 tablet (50 mg total) by mouth every 8 (eight) hours as needed. 20 tablet 0    (Not in a hospital  admission)   History reviewed. No pertinent family history.   Review of Systems:   Review of Systems  Constitutional: Positive for malaise/fatigue and weight loss.  Respiratory: Positive for shortness of breath.   Cardiovascular: Negative for chest pain and leg swelling.  Musculoskeletal: Positive for back pain and falls.  Neurological: Positive for weakness.     Physical Exam: BP 122/82   Pulse 91   Temp 97.9 F (36.6 C) (Oral)   Resp (!) 25   Ht 5\' 6"  (1.676 m)   Wt 61.2 kg   SpO2 98%   BMI 21.78 kg/m    General appearance: alert, thin, appears uncomfortable Resp: clear to auscultation bilaterally Cardio: irregularly irregular rhythm GI: soft, non-tender; bowel sounds normal; no masses,  no organomegaly Extremities: extremities normal, atraumatic, no cyanosis or edema Neurologic: Grossly normal  Diagnostic Studies & Laboratory data:     Recent Radiology Findings:   DG Chest Portable 1 View  Result Date: 02/12/2021 CLINICAL DATA:  Chest tube placement EXAM: PORTABLE CHEST 1 VIEW COMPARISON:  02/12/2021 FINDINGS: Interval placement of left-sided chest tube with pigtail at the left lateral mid chest. Decreased size of left pneumothorax with decreased mediastinal shift to the right. Possible small loculated air collection within the left upper lung. Small left effusion. Airspace disease throughout the left lung. Slightly nodular appearing opacities in the right upper and lower lung. Probable bullous change at the left apex. Aortic atherosclerosis. IMPRESSION: 1. Interval placement of left-sided chest tube with decreased left pneumothorax and decreased mediastinal shift to the right. Airspace disease throughout the left lung which may reflect edema or diffuse pneumonia, with small left effusion. Possible small loculated air collection within the left upper lung. 2. Nodular appearing opacities in the right upper and lower lung, question small nodules or nodular infiltrates. CT  chest could better evaluate this finding. Electronically Signed   By: 04/14/2021 M.D.   On: 02/12/2021 16:11   DG Chest Portable 1 View  Addendum Date: 02/12/2021   ADDENDUM REPORT: 02/12/2021 14:08 ADDENDUM: Critical Value/emergent results were called by telephone at the time of interpretation on 02/12/2021 at 2:08 pm to provider MATTHEW TRIFAN , who verbally acknowledged these results. Electronically Signed   By: 04/14/2021 M.D.   On: 02/12/2021 14:08   Result Date: 02/12/2021 CLINICAL DATA:  Follow-up large left tension pneumothorax. EXAM: PORTABLE CHEST 1 VIEW COMPARISON:  Earlier today at 04/14/2021  Imaging. FINDINGS: An approximately 90% left hydropneumothorax with severe compression and atelectasis of the lung medially and marked mediastinal shift to the right is again demonstrated. The heart remains normal in size. Vascular crowding in the right lung without significant change. Diffuse osteopenia. IMPRESSION: Approximately 90% left tension hydropneumothorax with severe compression and atelectasis of the lung medially and marked mediastinal shift to the right, unchanged since earlier today. Electronically Signed: By: Beckie Salts M.D. On: 02/12/2021 14:00     I have independently reviewed the above radiologic studies and discussed with the patient   Recent Lab Findings: Lab Results  Component Value Date   WBC 9.3 02/12/2021   HGB 12.4 02/12/2021   HCT 38.8 02/12/2021   PLT 578 (H) 02/12/2021   GLUCOSE 243 (H) 02/12/2021   CHOL 245 (H) 03/07/2020   TRIG 247.0 (H) 03/07/2020   HDL 42.70 03/07/2020   LDLDIRECT 171.0 03/07/2020   ALT 16 01/22/2021   AST 22 01/22/2021   NA 133 (L) 02/12/2021   K 4.3 02/12/2021   CL 97 (L) 02/12/2021   CREATININE 0.50 02/12/2021   BUN 8 02/12/2021   CO2 22 02/12/2021   TSH 8.39 (H) 03/07/2020   HGBA1C 13.7 (H) 03/07/2020      Assessment / Plan:      1. Pneumothorax-decreased left pneumo and decreased mediastinal shift. Pigtail catheter in place.  Will order another CXR for the morning.   2. DM Type 1-would recommend SSI and Levemir for hospital stay, per medicine 3. 15kg weight loss and loss of appetite. Vomiting reported last admission on 2/10. She states that the weight loss was unintentional.  4. Compression fracture of T12 vertebra diagnosed on 2/10, on-going back pain.  5. 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 Her biggest complaint is her back pain. This will need to be addressed by the medicine service. We will manage her chest tube.    I  spent 20 minutes counseling the patient face to face.   Jari Favre, PA-C 02/12/2021 4:23 PM  I have seen and examined the patient and agree with the assessment and plan as outlined above by Jari Favre, PA-C  Patient is a 65 year old female with no previous history of pneumothorax but longstanding history of tobacco abuse, COPD, type 2 diabetes mellitus, recent 30 pound weight loss, and fall approximately 2 weeks ago which resulted in compression fracture of the 12th thoracic vertebra who presented to the hospital with resting shortness of breath and chest discomfort and was found to have large left-sided pneumothorax associated with mediastinal shift.  Small bore chest tube was placed by the emergency department team and follow-up chest x-ray demonstrates satisfactory tube placement with reexpansion of the left lung.  We will plan CT scan of the chest to rule out possible hemothorax related to the patient's recent fall and to rule out the presence of underlying mass or other signs of malignancy.  We will continue to follow along and assist with chest tube management.     Purcell Nails, MD 02/12/2021 5:09 PM

## 2021-02-12 NOTE — ED Provider Notes (Signed)
CHEST TUBE INSERTION  Date/Time: 02/12/2021 3:21 PM Performed by: Dollene Cleveland, DO Authorized by: Terald Sleeper, MD   Consent:    Consent obtained:  Written   Consent given by:  Patient   Risks, benefits, and alternatives were discussed: yes     Risks discussed:  Bleeding, incomplete drainage, infection and pain Universal protocol:    Required blood products, implants, devices, and special equipment available: yes     Site/side marked: yes     Patient identity confirmed:  Verbally with patient and arm band Pre-procedure details:    Skin preparation:  Chlorhexidine   Preparation: Patient was prepped and draped in the usual sterile fashion   Sedation:    Sedation type:  None Anesthesia:    Anesthesia method:  Local infiltration   Local anesthetic:  Lidocaine 1% w/o epi Procedure details:    Placement location:  L lateral   Scalpel size:  11   Tube size ([place]): 14.   Ultrasound guidance: no     Tension pneumothorax: yes     Tube connected to:  Water seal and suction   Drainage characteristics:  Yellow   Suture material:  2-0 silk   Dressing:  4x4 sterile gauze Post-procedure details:    Post-insertion x-ray findings: tube in good position     Procedure completion:  Vanita Panda, DO 02/12/21 1525    Terald Sleeper, MD 02/12/21 1718

## 2021-02-13 ENCOUNTER — Inpatient Hospital Stay (HOSPITAL_COMMUNITY): Payer: BLUE CROSS/BLUE SHIELD

## 2021-02-13 DIAGNOSIS — E113512 Type 2 diabetes mellitus with proliferative diabetic retinopathy with macular edema, left eye: Secondary | ICD-10-CM | POA: Diagnosis not present

## 2021-02-13 DIAGNOSIS — R634 Abnormal weight loss: Secondary | ICD-10-CM | POA: Diagnosis not present

## 2021-02-13 DIAGNOSIS — J939 Pneumothorax, unspecified: Secondary | ICD-10-CM

## 2021-02-13 LAB — URINALYSIS, ROUTINE W REFLEX MICROSCOPIC
Bacteria, UA: NONE SEEN
Bilirubin Urine: NEGATIVE
Glucose, UA: 150 mg/dL — AB
Ketones, ur: NEGATIVE mg/dL
Leukocytes,Ua: NEGATIVE
Nitrite: NEGATIVE
Protein, ur: NEGATIVE mg/dL
Specific Gravity, Urine: 1.013 (ref 1.005–1.030)
pH: 6 (ref 5.0–8.0)

## 2021-02-13 LAB — HEMOGLOBIN A1C
Hgb A1c MFr Bld: 10.8 % — ABNORMAL HIGH (ref 4.8–5.6)
Mean Plasma Glucose: 263.26 mg/dL

## 2021-02-13 LAB — GLUCOSE, CAPILLARY
Glucose-Capillary: 203 mg/dL — ABNORMAL HIGH (ref 70–99)
Glucose-Capillary: 342 mg/dL — ABNORMAL HIGH (ref 70–99)

## 2021-02-13 LAB — HIV ANTIBODY (ROUTINE TESTING W REFLEX): HIV Screen 4th Generation wRfx: NONREACTIVE

## 2021-02-13 MED ORDER — GLIMEPIRIDE 1 MG PO TABS
1.0000 mg | ORAL_TABLET | Freq: Once | ORAL | Status: AC
  Administered 2021-02-13: 1 mg via ORAL
  Filled 2021-02-13: qty 1

## 2021-02-13 MED ORDER — INSULIN ASPART 100 UNIT/ML ~~LOC~~ SOLN
0.0000 [IU] | Freq: Three times a day (TID) | SUBCUTANEOUS | Status: DC
  Administered 2021-02-20: 2 [IU] via SUBCUTANEOUS
  Administered 2021-02-22: 7 [IU] via SUBCUTANEOUS
  Administered 2021-02-22 (×2): 2 [IU] via SUBCUTANEOUS
  Administered 2021-02-23 (×3): 1 [IU] via SUBCUTANEOUS
  Administered 2021-02-24: 7 [IU] via SUBCUTANEOUS
  Administered 2021-02-25 – 2021-02-26 (×2): 1 [IU] via SUBCUTANEOUS
  Administered 2021-02-27: 2 [IU] via SUBCUTANEOUS
  Administered 2021-02-27 – 2021-02-28 (×2): 1 [IU] via SUBCUTANEOUS
  Administered 2021-03-02: 2 [IU] via SUBCUTANEOUS

## 2021-02-13 MED ORDER — INSULIN ASPART 100 UNIT/ML ~~LOC~~ SOLN: 0.0000 [IU] | Freq: Every day | SUBCUTANEOUS | Status: DC

## 2021-02-13 NOTE — Evaluation (Signed)
Occupational Therapy Evaluation Patient Details Name: [name] [name] MRN: [id number] DOB: [birth year] Today's Date: 02/13/2021    History of Present Illness 65 y.o. female with a PMH significant for type 2 diabetes, recent vertebral fracture, tobacco use, hyperlipidemia, hypothyroidism.  Admitted with SOB and hydropneumothorax with severe compression and atelectasis of the lung.  Chest tube placement/wall suction with 2 L O2 via [place].   Clinical Impression   Patient was admitted for the above diagnosis.  Recent T-12 fracture is uncomfortable, but she is moving quite well, needing up to supervision for in room mobility with HHA.  She is completing her ADL with supervision as well.  She plans on transitioning home, but the spouse is interested in [facility] PT if needed.  OT will continue to see her in the acute setting to ensure a safe transition home.  Chest tube with wall suction in place.      Follow Up Recommendations  Other (comment) (? HH PT)    Equipment Recommendations  Other (comment) (? RW versus a Cane)    Recommendations for Other Services       Precautions / Restrictions Precautions Precautions: Fall Precaution Comments: Chest tube Restrictions Weight Bearing Restrictions: No      Mobility Bed Mobility Overal bed mobility: Needs Assistance Bed Mobility: Sidelying to Sit;Sit to Sidelying   Sidelying to sit: Supervision     Sit to sidelying: Supervision   Patient Response: Cooperative  Transfers Overall transfer level: Needs assistance Equipment used: 1 person hand held assist Transfers: Sit to/from [employer] Sit to Stand: Supervision Stand pivot transfers: Supervision            Balance Overall balance assessment: Needs assistance Sitting-balance support: Feet supported Sitting balance-Leahy Scale: Good     Standing balance support: Single extremity supported Standing balance-Leahy Scale: Fair                 High Level Balance  Comments: light hand held assist           ADL either performed or assessed with clinical judgement   ADL Overall ADL's : Needs assistance/impaired     Grooming: Wash/dry hands;Standing;Supervision/safety       Lower Body Bathing: Supervison/ safety;Sit to/from stand       Lower Body Dressing: Supervision/safety;Sit to/from stand   Toilet Transfer: Supervision/safety;Ambulation   Toileting- Clothing Manipulation and Hygiene: Supervision/safety;Sit to/from stand       Functional mobility during ADLs: Supervision/safety       Vision Patient Visual Report: No change from baseline       Perception     Praxis      Pertinent Vitals/Pain Pain Assessment: Faces Faces Pain Scale: Hurts a little bit Pain Location: back when lying flat Pain Descriptors / Indicators: Aching Pain Intervention(s): Monitored during session     Hand Dominance Right   Extremity/Trunk Assessment Upper Extremity Assessment Upper Extremity Assessment: Overall WFL for tasks assessed   Lower Extremity Assessment Lower Extremity Assessment: Defer to PT evaluation   Cervical / Trunk Assessment Cervical / Trunk Assessment: Kyphotic   Communication Communication Communication: No difficulties   Cognition Arousal/Alertness: Awake/alert Behavior During Therapy: WFL for tasks assessed/performed Overall Cognitive Status: Within Functional Limits for tasks assessed                                     General Comments   De-sat to 89% on room air  Exercises     Shoulder Instructions      Home Living Family/patient expects to be discharged to:: Private residence Living Arrangements: Spouse/significant other Available Help at Discharge: Family;Available 24 hours/day Type of Home: House Home Access: Stairs to enter [employer] of Steps: 1   Home Layout: One level     Bathroom Shower/Tub: Chief Strategy Officer: Standard     Home Equipment:  None          Prior Functioning/Environment Level of Independence: Independent        Comments: Recent T12 compression fracture.        OT Problem List: Decreased activity tolerance;Impaired balance (sitting and/or standing);Pain      OT Treatment/Interventions: Self-care/ADL training;Balance training;Therapeutic activities    OT Goals(Current goals can be found in the care plan section) Acute Rehab OT Goals Patient Stated Goal: I'm ready to go home OT Goal Formulation: With patient Time For Goal Achievement: 02/27/21 Potential to Achieve Goals: Good ADL Goals Pt Will Perform Grooming: Independently;standing Pt Will Perform Lower Body Bathing: Independently;sit to/from stand Pt Will Perform Lower Body Dressing: Independently;sit to/from stand Pt Will Transfer to Toilet: Independently;regular height toilet;ambulating Pt Will Perform Toileting - Clothing Manipulation and hygiene: Independently;sit to/from stand  OT Frequency: Min 2X/week   Barriers to D/C:    none noted       Co-evaluation              AM-PAC OT "6 Clicks" Daily Activity     Outcome Measure Help from another person eating meals?: None Help from another person taking care of personal grooming?: None Help from another person toileting, which includes using toliet, bedpan, or urinal?: A Little Help from another person bathing (including washing, rinsing, drying)?: A Little Help from another person to put on and taking off regular upper body clothing?: A Little Help from another person to put on and taking off regular lower body clothing?: A Little 6 Click Score: 20   End of Session Nurse Communication: Mobility status  Activity Tolerance: Patient tolerated treatment well Patient left: in bed;with call bell/phone within reach;with family/visitor present  OT Visit Diagnosis: Unsteadiness on feet (R26.81);Pain                Time: 8676-1950 OT Time Calculation (min): 21 min Charges:  OT General  Charges $OT Visit: 1 Visit OT Evaluation $OT Eval Moderate Complexity: 1 Mod  02/13/2021  Rich, OTR/L  Acute Rehabilitation Services  Office:  [phone]   [name] [name] 02/13/2021, 11:20 AM

## 2021-02-13 NOTE — Progress Notes (Signed)
[address]       [name] [name] [address]             [phone]        Procedure(s) (LRB): VIDEO BRONCHOSCOPY WITH FLUORO (Left) Subjective: Late entry.  [name] [name] was seen at 10am today.  Resting on her right side, says she is more comfortable in that position.  Denies shortness of breath.   Objective: Vital signs in last 24 hours: Temp:  [98.1 F (36.7 C)-101.4 F (38.6 C)] 101.4 F (38.6 C) (03/04 1259) Pulse Rate:  [95-107] 99 (03/04 1259) Cardiac Rhythm: Normal sinus rhythm (03/04 0700) Resp:  [17-36] 19 (03/04 1259) BP: (122-148)/(73-85) 129/73 (03/04 1259) SpO2:  [91 %-97 %] 97 % (03/04 1259) Weight:  [44.7 kg] 44.7 kg (03/04 0222)    Intake/Output from previous day: 03/03 0701 - 03/04 0700 In: 480 [P.O.:480] Out: 1120 [Urine:950; Chest Tube:170] Intake/Output this shift: Total I/O In: 250 [P.O.:250] Out: 420 [Urine:400; Chest Tube:20]  General appearance: alert, cooperative and mild distress Neurologic: intact Heart: RRR Lungs: breath sounds clear on the right, absent in left upper fields, diminished left base.  The left pleural pigtail catheter is secured and connections are tight. No air leak. Tubing has thin, yellow serous fluid. Drainage overnight.   Lab Results: Recent Labs    02/12/21 1336  WBC 9.3  HGB 12.4  HCT 38.8  PLT 578*   BMET:  Recent Labs    02/12/21 1336  NA 133*  K 4.3  CL 97*  CO2 22  GLUCOSE 243*  BUN 8  CREATININE 0.50  CALCIUM 9.2    PT/INR: No results for input(s): LABPROT, INR in the last 72 hours. ABG No results found for: PHART, HCO3, TCO2, ACIDBASEDEF, O2SAT CBG (last 3)  No results for input(s): GLUCAP in the last 72 hours.    EXAM: CT ANGIOGRAPHY CHEST WITH CONTRAST  TECHNIQUE: Multidetector CT imaging of the chest was performed using the standard protocol during bolus administration of intravenous contrast. Multiplanar CT image reconstructions and MIPs were obtained to  evaluate the vascular anatomy.  CONTRAST:  6mL OMNIPAQUE IOHEXOL 350 MG/ML SOLN  COMPARISON:  02/12/2021  FINDINGS: Cardiovascular: This is a technically adequate evaluation of the pulmonary vasculature. No filling defects or pulmonary emboli.  The heart is unremarkable without pericardial effusion. Mild dilation and ectasia of the ascending thoracic aorta without frank aneurysm. No evidence of dissection. Minimal atherosclerosis.  Mediastinum/Nodes: No enlarged mediastinal, hilar, or axillary lymph nodes. Thyroid gland, trachea, and esophagus demonstrate no significant findings.  Lungs/Pleura: There is a chest tube within the left lateral pleural space, with near complete resolution of the left-sided pneumothorax seen previously. There is a trace left pleural effusion.  Large thick-walled cavity replaces the majority of the left upper lobe, with dense consolidation throughout the lingula. Similarly, there is a thick-walled cavity within the superior segment of the left lower lobe, with extensive dense airspace disease throughout the remainder of the left lower lobe. There is patchy tree in bud nodular consolidation throughout the right upper lobe. Multiple small pulmonary nodules are identified within the right upper lobe, largest measuring approximately 9 mm in diameter. No right-sided effusion or pneumothorax. The central airways are patent.  Upper Abdomen: No acute abnormality.  Musculoskeletal: Chronic T12 compression fracture is noted. Greater than 50% loss of height. No retropulsion. No acute bony abnormalities. Reconstructed images demonstrate no additional findings.  Review of the MIP images confirms the above findings.  IMPRESSION: 1. Bilateral areas of airspace disease, much more pronounced throughout the left lung with areas of dense consolidation and thick-walled cavities as above. Overall, findings favor atypical infection such as fungal  pneumonia or even tuberculosis. Sputum analysis may be useful. Cavitating malignancy cannot be completely excluded, though the widespread disease would be atypical. 2. Multiple subcentimeter solid nodules within the right upper lobe, largest measuring 9 mm. 3. Trace residual left pneumothorax with indwelling left chest tube as above. 4. Trace left pleural effusion. 5. No evidence of pulmonary embolus. 6.  Aortic Atherosclerosis (ICD10-I70.0).   Electronically Signed   By: Sharlet Salina M.D.   On: 02/12/2021 22:01     EXAM: PORTABLE CHEST 1 VIEW  COMPARISON:  02/12/2021  FINDINGS: Cardiac shadow is stable. Pigtail catheter is again noted on the left. No definitive pneumothorax is noted. Large cavitary lesion is again noted in the left apex similar to that seen on recent CT examination. Patchy reticulonodular infiltrate is noted throughout the right lung. Consolidation in the left lung base is again seen and stable. No bony abnormality is noted.  IMPRESSION: Overall stable appearance of the chest from the most recent CT examination. Large left apex cavitary lesion is noted as well as bilateral infiltrates worse in the left base than the right.   Electronically Signed   By: Alcide Clever M.D.   On: 02/13/2021 05:55  Assessment/Plan: S/P Procedure(s) (LRB): VIDEO BRONCHOSCOPY WITH FLUORO (Left)  65yo former smoker with 15kg Wt loss in recent months admitted with a left tension PTX that has resolved with insertion of a left pigtail pleural tube by the ED physician.  Follow up CT scan shows the left upper lobe and superior segment of the left lower lobe are replace with a thick-walled cavity.  Additionally, there are multiple solid nodules in the right upper lobe. Differential Dx includes TB, atypical fungal infection, or malignancy.   Pulmonary medicine consulted and further w/u planned with bronchoscopy on Monday, 7/3.  Plan to leave the CT to suction, CXR in AM. We  will follow.    LOS: 1 day    Leary Roca, PA-C [phone] 02/13/2021

## 2021-02-13 NOTE — Progress Notes (Signed)
Inpatient Diabetes Program Recommendations  AACE/ADA: New Consensus Statement on Inpatient Glycemic Control (2015)  Target Ranges:  Prepandial:   less than 140 mg/dL      Peak postprandial:   less than 180 mg/dL (1-2 hours)      Critically ill patients:  140 - 180 mg/dL   Lab Results  Component Value Date   GLUCAP 256 (H) 01/22/2021   HGBA1C 10.8 (H) 02/13/2021    Review of Glycemic Control  Diabetes history: DM2 Outpatient Diabetes medications: Amaryl 2 mg + Metformin 1 gm bid  Current orders for Inpatient glycemic control: None  Inpatient Diabetes Program Recommendations:    Noted A1c is 10.8 currently with previous A1c 13.7 in March 2021. Hx of 35 lb. weight loss over the past year -Glycemic control order set with 0-9 units tid + hs 0-5 units Will follow during hospitalization.  Thank you, [name] [name]. [name] Lowe, RN, MSN, CDE  Diabetes Coordinator Inpatient Glycemic Control Team Team Pager [phone] (8am-5pm) 02/13/2021 1:21 PM

## 2021-02-13 NOTE — Progress Notes (Signed)
PROGRESS NOTE    [name] [name]  GNF:[id number] DOB: October 27, 1956 DOA: 02/12/2021 PCP: [name] PernaWang, Yun, MD   Brief Narrative:  HPI On 02/12/2021 by Dr. Fernande BrasJennifer [name] [name] [name] is a 65 y.o. female with a PMH significant for type 2 diabetes, recent vertebral fracture, tobacco use, hyperlipidemia, hypothyroidism.  [name] [name] initially saw her PCP last week for shortness of breath and was scheduled for a chest x-ray which took place earlier today.  She presented to the emergency room from the imaging center via ambulance due to the pneumothorax seen on chest x-ray. In the ED, it was found that they had approximately 90% left tension hydropneumothorax with severe compression and atelectasis of the lung medially and marked mediastinal shift to the right, normal oxygen saturation on room air and not in respiratory distress. They were treated with left chest tube and 2 L oxygen via nasal cannula.  Her vital signs remained stable and interval chest x-ray showed decreased shift. Patient was admitted to medicine service for further workup and management of chronic medical conditions as outlined in detail below.  Additionally, patient endorses 1/2 pack/day for 30-year smoking history.  She stopped smoking 2 years ago.  Denies having any mammograms or colonoscopies and declines those cancer screenings at this time.  Patient has had recent unintentional weight loss and she attributes this to her back pain from recent fall.  Patient was treated approximately 3 weeks ago for vertebral compression fracture after fall in the bathroom.  At baseline, patient can ambulate and perform ADLs without difficulty.  Interim history Admitted for pneumothorax. CT chest obtained showing cavitary lesion. Patient with chest tube in place and cardiothoracic surgery following.  Have consulted pulmonology for possible bronch. Assessment & Plan   Dyspnea secondary to spontaneous left tension pneumothorax -Status post left  pigtail -CT surgery consulted and appreciated -High concern for malignancy or infection  Possible cavitary lesion with fever -CTA chest showed bilateral areas of airspace disease, dense consolidation and thick walled cavities, findings favor atypical infection or fungal pneumonia or TB.  Cavitating malignancy cannot be excluded.  Multiple subcentimeter solid nodules in the right upper lobe, measuring 9 mm  -Chest x-ray today showed large left apex cavitary lesion as well as bilateral infiltrates worse in the left base than the right -Pulmonology consulted and appreciated -Will place on airborne precautions -Patient did have fever today 1 to 1.4 F -Will obtain blood cultures as well as UA and urine culture -Will hold off on starting antibiotics at this time  Diabetes mellitus, type II, poorly controlled -Hemoglobin A1c 10.8 -Will hold home medications of Amaryl and Metformin -Will place patient on insulin sliding scale with CBG monitoring  Hypothyroidism -Continue Synthroid  Thoracic vertebral compression fracture -Present on admission -Continue nasal calcitonin, PRN tramadol and morphine, lidocaine patch -PT and OT recommending home health  Mild hyponatremia -Patient asymptomatic -Sodium currently 133, will continue to monitor BMP  DVT Prophylaxis  SCDs  Code Status: Full  Family Communication: None at bedside  Disposition Plan:  Status is: Inpatient  Remains inpatient appropriate because:Inpatient level of care appropriate due to severity of illness   Dispo: The patient is from: Home              Anticipated d/c is to: Home              Patient currently is not medically stable to d/c.   Difficult to place patient No   Consultants Cardiothoracic surgery PCCM  Procedures  Pigtail placement  Antibiotics  Anti-infectives (From admission, onward)   None      Subjective:   [name] [name] seen and examined today.  Patient continues to have back pain but  feels it is more manageable with the Lidoderm patch.  Currently denies chest pain or shortness of breath at this time.  Feels that shortness of breath is improved since coming to the hospital.  She denies abdominal pain, nausea or vomiting, diarrhea or constipation, dizziness or headache.    Objective:   Vitals:   02/13/21 0521 02/13/21 0737 02/13/21 1114 02/13/21 1259  BP: 138/85 122/78  129/73  Pulse: 100 (!) 107  99  Resp: 18 17  19   Temp: 98.1 F (36.7 C) 98.5 F (36.9 C)  (!) 101.4 F (38.6 C)  TempSrc: Oral Oral  Oral  SpO2: 94% 96% 95% 97%  Weight:      Height:        Intake/Output Summary (Last 24 hours) at 02/13/2021 1428 Last data filed at 02/13/2021 1342 Gross per 24 hour  Intake 730 ml  Output 1540 ml  Net -810 ml   Filed Weights   02/12/21 1303 02/13/21 0222  Weight: 61.2 kg 44.7 kg    Exam  General: Well developed, frail, NAD  HEENT: NCAT, mucous membranes moist.   Neck: Supple, no JVD, no masses  Cardiovascular: S1 S2 auscultated, no murmurs, RRR  Respiratory: Diminished breath sounds on the left, left-sided chest tube in place with dressing  Abdomen: Soft, nontender, nondistended, + bowel sounds  Extremities: warm dry without cyanosis clubbing or edema  Neuro: AAOx3, nonfocal  Psych: pleasant, appropriate mood and affect   Data Reviewed: I have personally reviewed following labs and imaging studies  CBC: Recent Labs  Lab 02/12/21 1336  WBC 9.3  NEUTROABS 6.8  HGB 12.4  HCT 38.8  MCV 83.4  PLT 578*   Basic Metabolic Panel: Recent Labs  Lab 02/12/21 1336  NA 133*  K 4.3  CL 97*  CO2 22  GLUCOSE 243*  BUN 8  CREATININE 0.50  CALCIUM 9.2   GFR: Estimated Creatinine Clearance: 51.5 mL/min (by C-G formula based on SCr of 0.5 mg/dL). Liver Function Tests: No results for input(s): AST, ALT, ALKPHOS, BILITOT, PROT, ALBUMIN in the last 168 hours. No results for input(s): LIPASE, AMYLASE in the last 168 hours. No results for  input(s): AMMONIA in the last 168 hours. Coagulation Profile: No results for input(s): INR, PROTIME in the last 168 hours. Cardiac Enzymes: No results for input(s): CKTOTAL, CKMB, CKMBINDEX, TROPONINI in the last 168 hours. BNP (last 3 results) No results for input(s): PROBNP in the last 8760 hours. HbA1C: Recent Labs    02/13/21 0357  HGBA1C 10.8*   CBG: No results for input(s): GLUCAP in the last 168 hours. Lipid Profile: No results for input(s): CHOL, HDL, LDLCALC, TRIG, CHOLHDL, LDLDIRECT in the last 72 hours. Thyroid Function Tests: No results for input(s): TSH, T4TOTAL, FREET4, T3FREE, THYROIDAB in the last 72 hours. Anemia Panel: No results for input(s): VITAMINB12, FOLATE, FERRITIN, TIBC, IRON, RETICCTPCT in the last 72 hours. Urine analysis:    Component Value Date/Time   COLORURINE YELLOW 01/22/2021 1311   APPEARANCEUR CLEAR 01/22/2021 1311   LABSPEC 1.020 01/22/2021 1311   PHURINE 6.0 01/22/2021 1311   GLUCOSEU 250 (A) 01/22/2021 1311   HGBUR TRACE (A) 01/22/2021 1311   HGBUR negative 01/19/2008 0750   BILIRUBINUR NEGATIVE 01/22/2021 1311   KETONESUR NEGATIVE 01/22/2021 1311   PROTEINUR 100 (A) 01/22/2021 1311   UROBILINOGEN  0.2 01/19/2008 0750   NITRITE NEGATIVE 01/22/2021 1311   LEUKOCYTESUR NEGATIVE 01/22/2021 1311   Sepsis Labs: (procalcitonin:4,lacticidven:4)  ) Recent Results (from the past 240 hour(s))  Resp Panel by RT-PCR (Flu A&B, Covid) Nasopharyngeal Swab     Status: None   Collection Time: 02/12/21  3:44 PM   Specimen: Nasopharyngeal Swab; Nasopharyngeal(NP) swabs in vial transport medium  Result Value Ref Range Status   SARS Coronavirus 2 by RT PCR NEGATIVE NEGATIVE Final    Comment: (NOTE) SARS-CoV-2 target nucleic acids are NOT DETECTED.  The SARS-CoV-2 RNA is generally detectable in upper respiratory specimens during the acute phase of infection. The lowest concentration of SARS-CoV-2 viral copies this assay can detect is 138  copies/mL. A negative result does not preclude SARS-Cov-2 infection and should not be used as the sole basis for treatment or other patient management decisions. A negative result may occur with  improper specimen collection/handling, submission of specimen other than nasopharyngeal swab, presence of viral mutation(s) within the areas targeted by this assay, and inadequate number of viral copies(<138 copies/mL). A negative result must be combined with clinical observations, patient history, and epidemiological information. The expected result is Negative.  Fact Sheet for Patients:  BloggerCourse.com  Fact Sheet for Healthcare Providers:  SeriousBroker.it  This test is no t yet approved or cleared by the [place] FDA and  has been authorized for detection and/or diagnosis of SARS-CoV-2 by FDA under an Emergency Use Authorization (EUA). This EUA will remain  in effect (meaning this test can be used) for the duration of the COVID-19 declaration under Section 564(b)(1) of the Act, 21 U.S.C.section 360bbb-3(b)(1), unless the authorization is terminated  or revoked sooner.       Influenza A by PCR NEGATIVE NEGATIVE Final   Influenza B by PCR NEGATIVE NEGATIVE Final    Comment: (NOTE) The Xpert Xpress SARS-CoV-2/FLU/RSV plus assay is intended as an aid in the diagnosis of influenza from Nasopharyngeal swab specimens and should not be used as a sole basis for treatment. Nasal washings and aspirates are unacceptable for Xpert Xpress SARS-CoV-2/FLU/RSV testing.  Fact Sheet for Patients: BloggerCourse.com  Fact Sheet for Healthcare Providers: SeriousBroker.it  This test is not yet approved or cleared by the [place] FDA and has been authorized for detection and/or diagnosis of SARS-CoV-2 by FDA under an Emergency Use Authorization (EUA). This EUA will remain in effect (meaning  this test can be used) for the duration of the COVID-19 declaration under Section 564(b)(1) of the Act, 21 U.S.C. section 360bbb-3(b)(1), unless the authorization is terminated or revoked.  Performed at [facility], [address], [place], [place] [address]       Radiology Studies: CT ANGIO CHEST PE W OR WO CONTRAST  Result Date: 02/12/2021 CLINICAL DATA:  Chest pain, short of breath, history of pneumothorax, abnormal chest x-ray EXAM: CT ANGIOGRAPHY CHEST WITH CONTRAST TECHNIQUE: Multidetector CT imaging of the chest was performed using the standard protocol during bolus administration of intravenous contrast. Multiplanar CT image reconstructions and MIPs were obtained to evaluate the vascular anatomy. CONTRAST:  35mL OMNIPAQUE IOHEXOL 350 MG/ML SOLN COMPARISON:  02/12/2021 FINDINGS: Cardiovascular: This is a technically adequate evaluation of the pulmonary vasculature. No filling defects or pulmonary emboli. The heart is unremarkable without pericardial effusion. Mild dilation and ectasia of the ascending thoracic aorta without frank aneurysm. No evidence of dissection. Minimal atherosclerosis. Mediastinum/Nodes: No enlarged mediastinal, hilar, or axillary lymph nodes. Thyroid gland, trachea, and esophagus demonstrate no significant findings. Lungs/Pleura:  There is a chest tube within the left lateral pleural space, with near complete resolution of the left-sided pneumothorax seen previously. There is a trace left pleural effusion. Large thick-walled cavity replaces the majority of the left upper lobe, with dense consolidation throughout the lingula. Similarly, there is a thick-walled cavity within the superior segment of the left lower lobe, with extensive dense airspace disease throughout the remainder of the left lower lobe. There is patchy tree in bud nodular consolidation throughout the right upper lobe. Multiple small pulmonary nodules are identified within the right upper lobe, largest  measuring approximately 9 mm in diameter. No right-sided effusion or pneumothorax. The central airways are patent. Upper Abdomen: No acute abnormality. Musculoskeletal: Chronic T12 compression fracture is noted. Greater than 50% loss of height. No retropulsion. No acute bony abnormalities. Reconstructed images demonstrate no additional findings. Review of the MIP images confirms the above findings. IMPRESSION: 1. Bilateral areas of airspace disease, much more pronounced throughout the left lung with areas of dense consolidation and thick-walled cavities as above. Overall, findings favor atypical infection such as fungal pneumonia or even tuberculosis. Sputum analysis may be useful. Cavitating malignancy cannot be completely excluded, though the widespread disease would be atypical. 2. Multiple subcentimeter solid nodules within the right upper lobe, largest measuring 9 mm. 3. Trace residual left pneumothorax with indwelling left chest tube as above. 4. Trace left pleural effusion. 5. No evidence of pulmonary embolus. 6.  Aortic Atherosclerosis (ICD10-I70.0). Electronically Signed   By: Sharlet Salina M.D.   On: 02/12/2021 22:01   DG Chest Port 1 View  Result Date: 02/13/2021 CLINICAL DATA:  Follow-up chest tube on the left EXAM: PORTABLE CHEST 1 VIEW COMPARISON:  02/12/2021 FINDINGS: Cardiac shadow is stable. Pigtail catheter is again noted on the left. No definitive pneumothorax is noted. Large cavitary lesion is again noted in the left apex similar to that seen on recent CT examination. Patchy reticulonodular infiltrate is noted throughout the right lung. Consolidation in the left lung base is again seen and stable. No bony abnormality is noted. IMPRESSION: Overall stable appearance of the chest from the most recent CT examination. Large left apex cavitary lesion is noted as well as bilateral infiltrates worse in the left base than the right. Electronically Signed   By: Alcide Clever M.D.   On: 02/13/2021 05:55    DG Chest Portable 1 View  Result Date: 02/12/2021 CLINICAL DATA:  Chest tube placement EXAM: PORTABLE CHEST 1 VIEW COMPARISON:  02/12/2021 FINDINGS: Interval placement of left-sided chest tube with pigtail at the left lateral mid chest. Decreased size of left pneumothorax with decreased mediastinal shift to the right. Possible small loculated air collection within the left upper lung. Small left effusion. Airspace disease throughout the left lung. Slightly nodular appearing opacities in the right upper and lower lung. Probable bullous change at the left apex. Aortic atherosclerosis. IMPRESSION: 1. Interval placement of left-sided chest tube with decreased left pneumothorax and decreased mediastinal shift to the right. Airspace disease throughout the left lung which may reflect edema or diffuse pneumonia, with small left effusion. Possible small loculated air collection within the left upper lung. 2. Nodular appearing opacities in the right upper and lower lung, question small nodules or nodular infiltrates. CT chest could better evaluate this finding. Electronically Signed   By: Jasmine Pang M.D.   On: 02/12/2021 16:11   DG Chest Portable 1 View  Addendum Date: 02/12/2021   ADDENDUM REPORT: 02/12/2021 14:08 ADDENDUM: Critical Value/emergent results were called by  telephone at the time of interpretation on 02/12/2021 at 2:08 pm to provider [facility] , who verbally acknowledged these results. Electronically Signed   By: Beckie Salts M.D.   On: 02/12/2021 14:08   Result Date: 02/12/2021 CLINICAL DATA:  Follow-up large left tension pneumothorax. EXAM: PORTABLE CHEST 1 VIEW COMPARISON:  Earlier today at [employer] [facility]. FINDINGS: An approximately 90% left hydropneumothorax with severe compression and atelectasis of the lung medially and marked mediastinal shift to the right is again demonstrated. The heart remains normal in size. Vascular crowding in the right lung without significant change. Diffuse  osteopenia. IMPRESSION: Approximately 90% left tension hydropneumothorax with severe compression and atelectasis of the lung medially and marked mediastinal shift to the right, unchanged since earlier today. Electronically Signed: By: Beckie Salts M.D. On: 02/12/2021 14:00     Scheduled Meds: . atorvastatin  10 mg Oral QPM  . calcitonin (salmon)  1 spray Alternating Nares [name]  . cholecalciferol  1,000 Units Oral [name]  . levothyroxine  50 mcg Oral QAC breakfast  . lidocaine  1 patch Transdermal Q24H   Continuous Infusions:   LOS: 1 day   Time Spent in minutes   45 minutes  Jhane Lorio D.O. on 02/13/2021 at 2:28 PM  Between 7am to 7pm - Please see pager noted on [email or web address]  After 7pm go to [email or web address]  And look for the night coverage person covering for me after hours  [facility] Office  [phone]

## 2021-02-13 NOTE — Evaluation (Signed)
Physical Therapy Evaluation Patient Details Name: [name] [name] MRN: [id number] DOB: Jan 17, 1956 Today's Date: 02/13/2021   History of Present Illness  65 y.o. female with admitted from PCP office on 3/3 with shortness of breath, CXR showing pneumothorax, s/p chest tube placement on 3/3. Pt with recent fall, compression fracture of T12 (unsure if from fall or not). PMH significant for type 2 diabetes, recent vertebral fracture, tobacco use, hyperlipidemia, hypothyroidism.  Clinical Impression   Pt presents with generalized weakness, impaired activity tolerance, and impaired gait. Pt to benefit from acute PT to address deficits. Pt ambulated short hallway distance, ~80 ft, and pt declines further gait because "I don't like sport". SpO2 96% on 2LO2 post-ambulation, with HR 119 bpm. PT to progress mobility as tolerated, and will continue to follow acutely.      Follow Up Recommendations Home health PT;Other (comment);Supervision for mobility/OOB (vs no follow up pending pt progress)    Equipment Recommendations  Rolling walker with 5" wheels    Recommendations for Other Services       Precautions / Restrictions Precautions Precautions: Fall Precaution Comments: Chest tube to wall suction, can be to water seal for mobility Restrictions Weight Bearing Restrictions: No      Mobility  Bed Mobility Overal bed mobility: Needs Assistance Bed Mobility: Rolling;Sidelying to Sit;Sit to Supine Rolling: Supervision Sidelying to sit: Min assist;HOB elevated     Sit to sidelying: Supervision General bed mobility comments: min assist for trunk elevation via HHA, supervision for rolling and return to supine for safety as well as lines and leads    Transfers Overall transfer level: Needs assistance Equipment used: Rolling walker (2 wheeled) Transfers: Sit to/from Stand Sit to Stand: Supervision Stand pivot transfers: Supervision       General transfer comment: for safety, VC for hand  placement when rising/sitting  Ambulation/Gait Ambulation/Gait assistance: Min guard Gait Distance (Feet): 80 Feet Assistive device: Rolling walker (2 wheeled) Gait Pattern/deviations: Step-through pattern;Decreased stride length;Trunk flexed Gait velocity: decr   General Gait Details: min guard to supervision for safety, initially requiring closer guard. Verbal cuing for upright posture, keeping RW close to self  Stairs            Wheelchair Mobility    Modified Rankin (Stroke Patients Only)       Balance Overall balance assessment: Needs assistance Sitting-balance support: Feet supported Sitting balance-Leahy Scale: Good Sitting balance - Comments: able to sit EOB without PT support   Standing balance support: Single extremity supported Standing balance-Leahy Scale: Fair Standing balance comment: benefits from RW use while ambulating for steadying assist               High Level Balance Comments: light hand held assist             Pertinent Vitals/Pain Pain Assessment: No/denies pain Faces Pain Scale: Hurts a little bit Pain Location: back when lying flat Pain Descriptors / Indicators: Aching Pain Intervention(s): Monitored during session    Home Living Family/patient expects to be discharged to:: Private residence Living Arrangements: Spouse/significant other Available Help at Discharge: Family;Available 24 hours/day Type of Home: House Home Access: Stairs to enter   [employer] of Steps: 1 Home Layout: One level Home Equipment: None      Prior Function Level of Independence: Independent         Comments: Recent fall, T12 compression fracture.     Hand Dominance   Dominant Hand: Right    Extremity/Trunk Assessment   Upper Extremity Assessment Upper  Extremity Assessment: Defer to OT evaluation    Lower Extremity Assessment Lower Extremity Assessment: Generalized weakness    Cervical / Trunk Assessment Cervical /  Trunk Assessment: Kyphotic  Communication   Communication: No difficulties  Cognition Arousal/Alertness: Awake/alert Behavior During Therapy: WFL for tasks assessed/performed Overall Cognitive Status: Within Functional Limits for tasks assessed                                        General Comments General comments (skin integrity, edema, etc.): SPO2 96% on 2LO2 and HR 119 bpm immediately post-ambulation    Exercises     Assessment/Plan    PT Assessment Patient needs continued PT services  PT Problem List Decreased strength;Decreased mobility;Decreased safety awareness;Decreased activity tolerance;Decreased balance;Decreased knowledge of use of DME;Pain;Cardiopulmonary status limiting activity       PT Treatment Interventions Therapeutic activities;DME instruction;Gait training;Patient/family education;Therapeutic exercise;Balance training;Neuromuscular re-education;Stair training;Functional mobility training    PT Goals (Current goals can be found in the Care Plan section)  Acute Rehab PT Goals Patient Stated Goal: home PT Goal Formulation: With patient Time For Goal Achievement: 02/27/21 Potential to Achieve Goals: Good    Frequency Min 3X/week   Barriers to discharge        Co-evaluation               AM-PAC PT "6 Clicks" Mobility  Outcome Measure Help needed turning from your back to your side while in a flat bed without using bedrails?: A Little Help needed moving from lying on your back to sitting on the side of a flat bed without using bedrails?: A Little Help needed moving to and from a bed to a chair (including a wheelchair)?: A Little Help needed standing up from a chair using your arms (e.g., wheelchair or bedside chair)?: A Little Help needed to walk in hospital room?: A Little Help needed climbing 3-5 steps with a railing? : A Little 6 Click Score: 18    End of Session Equipment Utilized During Treatment: Oxygen Activity  Tolerance: Patient tolerated treatment well;Patient limited by fatigue Patient left: in bed;with call bell/phone within reach;with bed alarm set Nurse Communication: Mobility status PT Visit Diagnosis: Other abnormalities of gait and mobility (R26.89);Difficulty in walking, not elsewhere classified (R26.2);Pain Pain - Right/Left: Left Pain - part of body:  (chest, back)    Time: 7673-4193 PT Time Calculation (min) (ACUTE ONLY): 18 min   Charges:   PT Evaluation $PT Eval Low Complexity: 1 Low          Auriana Scalia S, PT Acute Rehabilitation Services Pager [phone]  Office [phone]  Laquentin Loudermilk E Christain [place] 02/13/2021, 1:25 PM

## 2021-02-13 NOTE — Plan of Care (Signed)
  Problem: Clinical Measurements: Goal: Ability to maintain clinical measurements within normal limits will improve Outcome: Progressing Goal: Will remain free from infection Outcome: Progressing Goal: Diagnostic test results will improve Outcome: Progressing Goal: Respiratory complications will improve Outcome: Progressing Goal: Cardiovascular complication will be avoided Outcome: Progressing   Problem: Nutrition: Goal: Adequate nutrition will be maintained Outcome: Progressing   Problem: Activity: Goal: Risk for activity intolerance will decrease Outcome: Progressing   

## 2021-02-13 NOTE — Consult Note (Signed)
[name] [name], MRN:  [id number], DOB:  1956-11-07, LOS: 1 ADMISSION DATE:  02/12/2021, CONSULTATION DATE:  02/13/2021 REFERRING MD:  Nunzio Cory MD, CHIEF COMPLAINT: Abnormal CT scan, rule out TB  Brief History:   65 year old [place] immigrant with diabetes, vertebral fracture, tobacco use.  Presenting with large left hydropneumothorax s/p chest tube placement in the ED. CT shows airspace consolidation, cavitary lesions left greater than right.  She is febrile today prompting placement in airborne isolation due to concern for TB and PCCM consultation for possible bronchoscopy Complains of chronic cough, occasional sputum production.  Past Medical History:   Diabetes, hyperlipidemia, thyroid disease  Social history notable for 15-pack-year smoker.  Quit in 2020.  She denies exposure to TB, no being homeless or incarcerated. She is an immigrant from [place] over 20 years ago but still travels back every year.  Used to work as a Chief Technology Officer.  Denies any significant occupational or home exposures  She has about 15 kg weight loss over 3 months which she attributes to her recent fall and vertebral compression fracture, no loss of appetite.  Denies hemoptysis.  Significant Hospital Events:   3/3-admit with pneumothorax, chest tube placement on the left  Consults:   Cardiothoracic surgery, PCCM  Procedures:    Significant Diagnostic Tests:   CTA 02/12/2021-bilateral airspace disease with dense consolidation, cavitary lesions of the left, dense airspace consolidation of the lower lobe, multiple small pulmonary nodules.  Micro Data:    Antimicrobials:    Interim History / Subjective:    Objective   Blood pressure 129/73, pulse 99, temperature (!) 101.4 F (38.6 C), temperature source Oral, resp. rate 19, height 5\' 6"  (1.676 m), weight 44.7 kg, SpO2 97 %.        Intake/Output Summary (Last 24 hours) at 02/13/2021 1506 Last data filed at 02/13/2021 1342 Gross per 24  hour  Intake 730 ml  Output 1540 ml  Net -810 ml   Filed Weights   02/12/21 1303 02/13/21 0222  Weight: 61.2 kg 44.7 kg    Examination: Blood pressure 129/73, pulse 99, temperature (!) 101.4 F (38.6 C), temperature source Oral, resp. rate 19, height 5\' 6"  (1.676 m), weight 44.7 kg, SpO2 97 %. Gen:      No acute distress, frail, malnourished HEENT:  EOMI, sclera anicteric Neck:     No masses; no thyromegaly Lungs:    Diminished breath sounds on the left with crackles CV:         Regular rate and rhythm; no murmurs Abd:      + bowel sounds; soft, non-tender; no palpable masses, no distension Ext:    No edema; adequate peripheral perfusion Skin:      Warm and dry; no rash Neuro: alert and oriented x 3  Resolved Hospital Problem list     Assessment & Plan:  Left lung consolidation, cavitation with bilateral airspace disease. Presentation is concerning for tuberculosis.  Differential diagnosis includes fungal infection, malignancy given her smoking history Agree with airborne isolation Collect sputum AFB x3 If unable to produce enough sputum then we will proceed with bronchoscopy She has been tentatively scheduled for the procedure on 3/7 at 1 PM Keep n.p.o. past midnight on Sunday  Left pneumothorax s/p chest tube Chest tube in place to suction Follow daily chest x-ray  Best practice (evaluated daily)  Per primary  Labs   CBC: Recent Labs  Lab 02/12/21 1336  WBC 9.3  NEUTROABS 6.8  HGB 12.4  HCT 38.8  MCV 83.4  PLT 578*    Basic Metabolic Panel: Recent Labs  Lab 02/12/21 1336  NA 133*  K 4.3  CL 97*  CO2 22  GLUCOSE 243*  BUN 8  CREATININE 0.50  CALCIUM 9.2   GFR: Estimated Creatinine Clearance: 51.5 mL/min (by C-G formula based on SCr of 0.5 mg/dL). Recent Labs  Lab 02/12/21 1336  WBC 9.3    Liver Function Tests: No results for input(s): AST, ALT, ALKPHOS, BILITOT, PROT, ALBUMIN in the last 168 hours. No results for input(s): LIPASE, AMYLASE  in the last 168 hours. No results for input(s): AMMONIA in the last 168 hours.  ABG No results found for: PHART, PCO2ART, PO2ART, HCO3, TCO2, ACIDBASEDEF, O2SAT   Coagulation Profile: No results for input(s): INR, PROTIME in the last 168 hours.  Cardiac Enzymes: No results for input(s): CKTOTAL, CKMB, CKMBINDEX, TROPONINI in the last 168 hours.  HbA1C: Hgb A1c MFr Bld  Date/Time Value Ref Range Status  02/13/2021 03:57 AM 10.8 (H) 4.8 - 5.6 % Final    Comment:    (NOTE) Pre diabetes:          5.7%-6.4%  Diabetes:              >6.4%  Glycemic control for   <7.0% adults with diabetes   03/07/2020 11:03 AM 13.7 (H) 4.6 - 6.5 % Final    Comment:    Glycemic Control Guidelines for People with Diabetes:Non Diabetic:  <6%Goal of Therapy: <7%Additional Action Suggested:  >8%     CBG: No results for input(s): GLUCAP in the last 168 hours.  Review of Systems:   REVIEW OF SYSTEMS:   All negative; except for those that are bolded, which indicate positives.  Constitutional: weight loss, weight gain, night sweats, fevers, chills, fatigue, weakness.  HEENT: headaches, sore throat, sneezing, nasal congestion, post nasal drip, difficulty swallowing, tooth/dental problems, visual complaints, visual changes, ear aches. Neuro: difficulty with speech, weakness, numbness, ataxia. CV:  chest pain, orthopnea, PND, swelling in lower extremities, dizziness, palpitations, syncope.  Resp: cough, hemoptysis, dyspnea, wheezing. GI: heartburn, indigestion, abdominal pain, nausea, vomiting, diarrhea, constipation, change in bowel habits, loss of appetite, hematemesis, melena, hematochezia.  GU: dysuria, change in color of urine, urgency or frequency, flank pain, hematuria. MSK: joint pain or swelling, decreased range of motion. Psych: change in mood or affect, depression, anxiety, suicidal ideations, homicidal ideations. Skin: rash, itching, bruising.  Past Medical History:  She,  has a past medical  history of Compression fracture of T12 vertebra (HCC) (01/22/2021), Pneumothorax on left (02/12/2021), and Weight loss.   Surgical History:  History reviewed. No pertinent surgical history.   Social History:   reports that she quit smoking about 17 months ago. Her smoking use included cigarettes. She has never used smokeless tobacco. She reports previous alcohol use. She reports that she does not use drugs.   Family History:  Her family history is not on file.   Allergies No Known Allergies   Home Medications  Prior to Admission medications   Medication Sig Start Date End Date Taking? Authorizing Provider  atorvastatin (LIPITOR) 10 MG tablet Take 10 mg by mouth every evening. 01/12/21  Yes [provider]  Cholecalciferol (VITAMIN D3 PO) Take 1 tablet by mouth daily.   Yes [provider]  Continuous Blood Gluc Receiver (FREESTYLE LIBRE READER) DEVI 1 Device by Does not apply route as directed. 03/07/20  Yes [place], Betty G, MD  Continuous Blood Gluc Sensor (FREESTYLE LIBRE SENSOR SYSTEM) MISC  1 Device by Does not apply route 3 (three) times daily as needed. 03/07/20  Yes [place], Betty G, MD  Cyanocobalamin (B-12 PO) Take 1 tablet by mouth daily.   Yes [provider]  glimepiride (AMARYL) 2 MG tablet Take 2 mg by mouth daily with breakfast. 02/02/21  Yes [provider]  levothyroxine (SYNTHROID) 50 MCG tablet Take 50 mcg by mouth daily before breakfast.   Yes [provider]  metFORMIN (GLUCOPHAGE) 1000 MG tablet Take 1,000 mg by mouth 2 (two) times daily with a meal. 06/27/19  Yes [provider]  traMADol (ULTRAM) 50 MG tablet Take 1 tablet (50 mg total) by mouth every 8 (eight) hours as needed. 01/22/21  Yes Cathren Laine, MD     Signature:   Chilton Greathouse MD [facility] See Amion for pager  If no response to pager , please call [phone] until 7pm After 7:00 pm call Elink  [phone] 02/13/2021, 3:59  PM

## 2021-02-14 ENCOUNTER — Inpatient Hospital Stay (HOSPITAL_COMMUNITY): Payer: BLUE CROSS/BLUE SHIELD

## 2021-02-14 DIAGNOSIS — E113512 Type 2 diabetes mellitus with proliferative diabetic retinopathy with macular edema, left eye: Secondary | ICD-10-CM | POA: Diagnosis not present

## 2021-02-14 DIAGNOSIS — R634 Abnormal weight loss: Secondary | ICD-10-CM | POA: Diagnosis not present

## 2021-02-14 DIAGNOSIS — J939 Pneumothorax, unspecified: Secondary | ICD-10-CM | POA: Diagnosis not present

## 2021-02-14 LAB — CBC
HCT: 31.6 % — ABNORMAL LOW (ref 36.0–46.0)
Hemoglobin: 10.9 g/dL — ABNORMAL LOW (ref 12.0–15.0)
MCH: 27.1 pg (ref 26.0–34.0)
MCHC: 34.5 g/dL (ref 30.0–36.0)
MCV: 78.6 fL — ABNORMAL LOW (ref 80.0–100.0)
Platelets: 422 10*3/uL — ABNORMAL HIGH (ref 150–400)
RBC: 4.02 MIL/uL (ref 3.87–5.11)
RDW: 14 % (ref 11.5–15.5)
WBC: 16.4 10*3/uL — ABNORMAL HIGH (ref 4.0–10.5)
nRBC: 0 % (ref 0.0–0.2)

## 2021-02-14 LAB — EXPECTORATED SPUTUM ASSESSMENT W GRAM STAIN, RFLX TO RESP C: Special Requests: NORMAL

## 2021-02-14 LAB — BASIC METABOLIC PANEL
Anion gap: 10 (ref 5–15)
BUN: 9 mg/dL (ref 8–23)
CO2: 24 mmol/L (ref 22–32)
Calcium: 8.3 mg/dL — ABNORMAL LOW (ref 8.9–10.3)
Chloride: 91 mmol/L — ABNORMAL LOW (ref 98–111)
Creatinine, Ser: 0.41 mg/dL — ABNORMAL LOW (ref 0.44–1.00)
GFR, Estimated: >60 mL/min (ref >60)
Glucose, Bld: 176 mg/dL — ABNORMAL HIGH (ref 70–99)
Potassium: 3.6 mmol/L (ref 3.5–5.1)
Sodium: 125 mmol/L — ABNORMAL LOW (ref 135–145)

## 2021-02-14 LAB — GLUCOSE, CAPILLARY
Glucose-Capillary: 153 mg/dL — ABNORMAL HIGH (ref 70–99)
Glucose-Capillary: 212 mg/dL — ABNORMAL HIGH (ref 70–99)
Glucose-Capillary: 215 mg/dL — ABNORMAL HIGH (ref 70–99)
Glucose-Capillary: 224 mg/dL — ABNORMAL HIGH (ref 70–99)

## 2021-02-14 MED ORDER — GLIMEPIRIDE 2 MG PO TABS: 2.0000 mg | ORAL_TABLET | Freq: Every day | ORAL | Status: DC

## 2021-02-14 MED ORDER — GLIMEPIRIDE 1 MG PO TABS
1.0000 mg | ORAL_TABLET | Freq: Every day | ORAL | Status: DC
  Administered 2021-02-14 – 2021-02-17 (×3): 1 mg via ORAL
  Filled 2021-02-14 (×4): qty 1

## 2021-02-14 NOTE — Progress Notes (Signed)
PROGRESS NOTE    [name] [name]  RUE:[id number] DOB: 1956-10-05 DOA: 02/12/2021 PCP: Truett Perna, MD   Brief Narrative:  HPI On 02/12/2021 by Dr. Fernande Bras is a 65 y.o. female with a PMH significant for type 2 diabetes, recent vertebral fracture, tobacco use, hyperlipidemia, hypothyroidism.  [name] [name] initially saw her PCP last week for shortness of breath and was scheduled for a chest x-ray which took place earlier today.  She presented to the emergency room from the imaging center via ambulance due to the pneumothorax seen on chest x-ray. In the ED, it was found that they had approximately 90% left tension hydropneumothorax with severe compression and atelectasis of the lung medially and marked mediastinal shift to the right, normal oxygen saturation on room air and not in respiratory distress. They were treated with left chest tube and 2 L oxygen via nasal cannula.  Her vital signs remained stable and interval chest x-ray showed decreased shift. Patient was admitted to medicine service for further workup and management of chronic medical conditions as outlined in detail below.  Additionally, patient endorses 1/2 pack/day for 30-year smoking history.  She stopped smoking 2 years ago.  Denies having any mammograms or colonoscopies and declines those cancer screenings at this time.  Patient has had recent unintentional weight loss and she attributes this to her back pain from recent fall.  Patient was treated approximately 3 weeks ago for vertebral compression fracture after fall in the bathroom.  At baseline, patient can ambulate and perform ADLs without difficulty.  Interim history Admitted for pneumothorax. CT chest obtained showing cavitary lesion. Patient with chest tube in place and cardiothoracic surgery following.  Have consulted pulmonology for possible bronch. Assessment & Plan   Dyspnea secondary to spontaneous left tension pneumothorax -Status post left pigtail,  set to suction -CT surgery consulted and appreciated -High concern for malignancy or infection  Possible cavitary lesion  -CTA chest showed bilateral areas of airspace disease, dense consolidation and thick walled cavities, findings favor atypical infection or fungal pneumonia or TB.  Cavitating malignancy cannot be excluded.  Multiple subcentimeter solid nodules in the right upper lobe, measuring 9 mm  -Chest x-ray today showed large left apex cavitary lesion as well as bilateral infiltrates worse in the left base than the right -Pulmonology consulted and appreciated-planning possible bronchoscopy on 02/16/2021 -Placed on airborne precautions -Respiratory culture showing few gram-positive cocci, rare Gram variable rods  SIRS -Patient with leukocytosis today of 16.4, tachycardia as well as tachypnea -Low-grade fever of 99.3 F -Possibly related to the above -UA unremarkable for infection -Chest x-ray as above -Blood cultures no growth to date -Will hold off on starting antibiotics at this time  Diabetes mellitus, type II, poorly controlled -Hemoglobin A1c 10.8 -Home medications Amaryl and Metformin were held -Patient continues to refuse insulin sliding scale -We will continue CBG monitoring -Have had a long conversation with patient and explained the reasoning for being on insulin however she refuses -Will restart Amaryl  Hypothyroidism -Continue Synthroid  Thoracic vertebral compression fracture -Present on admission -Continue nasal calcitonin, PRN tramadol and morphine, lidocaine patch -PT and OT recommending home health  Mild hyponatremia -Patient asymptomatic -Sodium currently 125, will continue to monitor BMP  Malnutrition/underweight -BMI 15.95  -Nutrition consulted  DVT Prophylaxis  SCDs  Code Status: Full  Family Communication: None at bedside  Disposition Plan:  Status is: Inpatient  Remains inpatient appropriate because:Inpatient level of care appropriate  due to severity of illness   Dispo: The patient  is from: Home              Anticipated d/c is to: Home              Patient currently is not medically stable to d/c.   Difficult to place patient No   Consultants Cardiothoracic surgery PCCM  Procedures  Pigtail placement  Antibiotics   Anti-infectives (From admission, onward)   None      Subjective:   [name] [name] seen and examined today.  Denies current shortness of breath.  Continues to have some back pain.  Denies current chest pain, abdominal pain, nausea or vomiting, diarrhea or constipation, dizziness or headache.    Objective:   Vitals:   02/13/21 2011 02/14/21 0306 02/14/21 0308 02/14/21 0741  BP: 121/78  121/62 136/75  Pulse: (!) 108  (!) 101 100  Resp: 19  20 (!) 25  Temp: 99 F (37.2 C)  99.2 F (37.3 C) 99.3 F (37.4 C)  TempSrc: Oral  Oral Oral  SpO2: 94%  96% 98%  Weight:  44.8 kg    Height:        Intake/Output Summary (Last 24 hours) at 02/14/2021 1249 Last data filed at 02/14/2021 0651 Gross per 24 hour  Intake 300 ml  Output 920 ml  Net -620 ml   Filed Weights   02/12/21 1303 02/13/21 0222 02/14/21 0306  Weight: 61.2 kg 44.7 kg 44.8 kg   Exam  General: Well developed, frail, NAD  HEENT: NCAT, mucous membranes moist.   Cardiovascular: S1 S2 auscultated, RRR, no murmur  Respiratory: Diminished breath sounds on the left, chest tube in place  Abdomen: Soft, nontender, nondistended, + bowel sounds  Extremities: warm dry without cyanosis clubbing or edema  Neuro: AAOx3, nonfocal  Psych: pleasant, appropriate mood and affect  Data Reviewed: I have personally reviewed following labs and imaging studies  CBC: Recent Labs  Lab 02/12/21 1336 02/14/21 0444  WBC 9.3 16.4*  NEUTROABS 6.8  --   HGB 12.4 10.9*  HCT 38.8 31.6*  MCV 83.4 78.6*  PLT 578* 422*   Basic Metabolic Panel: Recent Labs  Lab 02/12/21 1336 02/14/21 0444  NA 133* 125*  K 4.3 3.6  CL 97* 91*  CO2 22 24   GLUCOSE 243* 176*  BUN 8 9  CREATININE 0.50 0.41*  CALCIUM 9.2 8.3*   GFR: Estimated Creatinine Clearance: 51.6 mL/min (A) (by C-G formula based on SCr of 0.41 mg/dL (L)). Liver Function Tests: No results for input(s): AST, ALT, ALKPHOS, BILITOT, PROT, ALBUMIN in the last 168 hours. No results for input(s): LIPASE, AMYLASE in the last 168 hours. No results for input(s): AMMONIA in the last 168 hours. Coagulation Profile: No results for input(s): INR, PROTIME in the last 168 hours. Cardiac Enzymes: No results for input(s): CKTOTAL, CKMB, CKMBINDEX, TROPONINI in the last 168 hours. BNP (last 3 results) No results for input(s): PROBNP in the last 8760 hours. HbA1C: Recent Labs    02/13/21 0357  HGBA1C 10.8*   CBG: Recent Labs  Lab 02/13/21 1721 02/13/21 2134 02/14/21 0619 02/14/21 1146  GLUCAP 203* 342* 153* 212*   Lipid Profile: No results for input(s): CHOL, HDL, LDLCALC, TRIG, CHOLHDL, LDLDIRECT in the last 72 hours. Thyroid Function Tests: No results for input(s): TSH, T4TOTAL, FREET4, T3FREE, THYROIDAB in the last 72 hours. Anemia Panel: No results for input(s): VITAMINB12, FOLATE, FERRITIN, TIBC, IRON, RETICCTPCT in the last 72 hours. Urine analysis:    Component Value Date/Time   COLORURINE YELLOW  02/13/2021 1900   APPEARANCEUR CLEAR 02/13/2021 1900   LABSPEC 1.013 02/13/2021 1900   PHURINE 6.0 02/13/2021 1900   GLUCOSEU 150 (A) 02/13/2021 1900   HGBUR SMALL (A) 02/13/2021 1900   HGBUR negative 01/19/2008 0750   BILIRUBINUR NEGATIVE 02/13/2021 1900   KETONESUR NEGATIVE 02/13/2021 1900   PROTEINUR NEGATIVE 02/13/2021 1900   UROBILINOGEN 0.2 01/19/2008 0750   NITRITE NEGATIVE 02/13/2021 1900   LEUKOCYTESUR NEGATIVE 02/13/2021 1900   Sepsis Labs: @LABRCNTIP (procalcitonin:4,lacticidven:4)  ) Recent Results (from the past 240 hour(s))  Resp Panel by RT-PCR (Flu A&B, Covid) Nasopharyngeal Swab     Status: None   Collection Time: 02/12/21  3:44 PM    Specimen: Nasopharyngeal Swab; Nasopharyngeal(NP) swabs in vial transport medium  Result Value Ref Range Status   SARS Coronavirus 2 by RT PCR NEGATIVE NEGATIVE Final    Comment: (NOTE) SARS-CoV-2 target nucleic acids are NOT DETECTED.  The SARS-CoV-2 RNA is generally detectable in upper respiratory specimens during the acute phase of infection. The lowest concentration of SARS-CoV-2 viral copies this assay can detect is 138 copies/mL. A negative result does not preclude SARS-Cov-2 infection and should not be used as the sole basis for treatment or other patient management decisions. A negative result may occur with  improper specimen collection/handling, submission of specimen other than nasopharyngeal swab, presence of viral mutation(s) within the areas targeted by this assay, and inadequate number of viral copies(<138 copies/mL). A negative result must be combined with clinical observations, patient history, and epidemiological information. The expected result is Negative.  Fact Sheet for Patients:  04/14/21  Fact Sheet for Healthcare Providers:  BloggerCourse.com  This test is no t yet approved or cleared by the [employer] FDA and  has been authorized for detection and/or diagnosis of SARS-CoV-2 by FDA under an Emergency Use Authorization (EUA). This EUA will remain  in effect (meaning this test can be used) for the duration of the COVID-19 declaration under Section 564(b)(1) of the Act, 21 U.S.C.section 360bbb-3(b)(1), unless the authorization is terminated  or revoked sooner.       Influenza A by PCR NEGATIVE NEGATIVE Final   Influenza B by PCR NEGATIVE NEGATIVE Final    Comment: (NOTE) The Xpert Xpress SARS-CoV-2/FLU/RSV plus assay is intended as an aid in the diagnosis of influenza from Nasopharyngeal swab specimens and should not be used as a sole basis for treatment. Nasal washings and aspirates are  unacceptable for Xpert Xpress SARS-CoV-2/FLU/RSV testing.  Fact Sheet for Patients: [place]  Fact Sheet for Healthcare Providers: BloggerCourse.com  This test is not yet approved or cleared by the [employer] FDA and has been authorized for detection and/or diagnosis of SARS-CoV-2 by FDA under an Emergency Use Authorization (EUA). This EUA will remain in effect (meaning this test can be used) for the duration of the COVID-19 declaration under Section 564(b)(1) of the Act, 21 U.S.C. section 360bbb-3(b)(1), unless the authorization is terminated or revoked.  Performed at [facility], [address] [place], [place]   Culture, blood (routine x 2)     Status: None (Preliminary result)   Collection Time: 02/13/21  2:25 PM   Specimen: BLOOD  Result Value Ref Range Status   Specimen Description BLOOD LEFT ANTECUBITAL  Final   Special Requests   Final    BOTTLES DRAWN AEROBIC AND ANAEROBIC Blood Culture adequate volume   Culture   Final    NO GROWTH < 24 HOURS Performed at [facility], [address], [place], [place] [address]    Report Status PENDING  Incomplete  Culture, blood (routine x 2)     Status: None (Preliminary result)   Collection Time: 02/13/21  2:29 PM   Specimen: BLOOD  Result Value Ref Range Status   Specimen Description BLOOD RIGHT ANTECUBITAL  Final   Special Requests   Final    BOTTLES DRAWN AEROBIC AND ANAEROBIC Blood Culture adequate volume   Culture   Final    NO GROWTH < 24 HOURS Performed at [facility], [address] [place], [place] [address]    Report Status PENDING  Incomplete  Expectorated Sputum Assessment w Gram Stain, Rflx to Resp Cult     Status: None   Collection Time: 02/13/21  3:13 PM   Specimen: Expectorated Sputum  Result Value Ref Range Status   Specimen Description EXPECTORATED SPUTUM  Final   Special Requests Normal  Final   Sputum  evaluation   Final    THIS SPECIMEN IS ACCEPTABLE FOR SPUTUM CULTURE Performed at [facility], [address] [place], [place] [address]    Report Status 02/14/2021 FINAL  Final  Culture, Respiratory w Gram Stain     Status: None (Preliminary result)   Collection Time: 02/13/21  3:13 PM  Result Value Ref Range Status   Specimen Description EXPECTORATED SPUTUM  Final   Special Requests Normal Reflexed from [id number]  Final   Gram Stain   Final    MODERATE WBC PRESENT,BOTH PMN AND MONONUCLEAR FEW GRAM POSITIVE COCCI RARE GRAM VARIABLE ROD Performed at [facility], [address], [place], [place] [address]    Culture PENDING  Incomplete   Report Status PENDING  Incomplete      Radiology Studies: CT ANGIO CHEST PE W OR WO CONTRAST  Result Date: 02/12/2021 CLINICAL DATA:  Chest pain, short of breath, history of pneumothorax, abnormal chest x-ray EXAM: CT ANGIOGRAPHY CHEST WITH CONTRAST TECHNIQUE: Multidetector CT imaging of the chest was performed using the standard protocol during bolus administration of intravenous contrast. Multiplanar CT image reconstructions and MIPs were obtained to evaluate the vascular anatomy. CONTRAST:  56mL OMNIPAQUE IOHEXOL 350 MG/ML SOLN COMPARISON:  02/12/2021 FINDINGS: Cardiovascular: This is a technically adequate evaluation of the pulmonary vasculature. No filling defects or pulmonary emboli. The heart is unremarkable without pericardial effusion. Mild dilation and ectasia of the ascending thoracic aorta without frank aneurysm. No evidence of dissection. Minimal atherosclerosis. Mediastinum/Nodes: No enlarged mediastinal, hilar, or axillary lymph nodes. Thyroid gland, trachea, and esophagus demonstrate no significant findings. Lungs/Pleura: There is a chest tube within the left lateral pleural space, with near complete resolution of the left-sided pneumothorax seen previously. There is a trace left pleural effusion. Large thick-walled cavity replaces  the majority of the left upper lobe, with dense consolidation throughout the lingula. Similarly, there is a thick-walled cavity within the superior segment of the left lower lobe, with extensive dense airspace disease throughout the remainder of the left lower lobe. There is patchy tree in bud nodular consolidation throughout the right upper lobe. Multiple small pulmonary nodules are identified within the right upper lobe, largest measuring approximately 9 mm in diameter. No right-sided effusion or pneumothorax. The central airways are patent. Upper Abdomen: No acute abnormality. Musculoskeletal: Chronic T12 compression fracture is noted. Greater than 50% loss of height. No retropulsion. No acute bony abnormalities. Reconstructed images demonstrate no additional findings. Review of the MIP images confirms the above findings. IMPRESSION: 1. Bilateral areas of airspace disease,  much more pronounced throughout the left lung with areas of dense consolidation and thick-walled cavities as above. Overall, findings favor atypical infection such as fungal pneumonia or even tuberculosis. Sputum analysis may be useful. Cavitating malignancy cannot be completely excluded, though the widespread disease would be atypical. 2. Multiple subcentimeter solid nodules within the right upper lobe, largest measuring 9 mm. 3. Trace residual left pneumothorax with indwelling left chest tube as above. 4. Trace left pleural effusion. 5. No evidence of pulmonary embolus. 6.  Aortic Atherosclerosis (ICD10-I70.0). Electronically Signed   By: Sharlet Salina M.D.   On: 02/12/2021 22:01   DG CHEST PORT 1 VIEW  Result Date: 02/14/2021 CLINICAL DATA:  Shortness of breath. Evaluate left apex cavitary lesion. EXAM: PORTABLE CHEST 1 VIEW COMPARISON:  February 13, 2021 FINDINGS: The left-sided chest tube is stable. The cavitary lesion in the left apex is stable, better appreciated on previous CT imaging. Prominent skin folds are seen over the lateral  right chest. No convincing evidence of pneumothorax on the right. Infiltrate throughout the right lung has worsened. Opacity in the left base is dense and stable. No other interval changes. IMPRESSION: 1. Stable left chest tube. 2. Stable cavitary mass in the left apex better seen on previous CT imaging. 3. Stable dense infiltrate in left base. 4. Increasing patchy infiltrate in the right lung. Electronically Signed   By: Gerome Sam III M.D   On: 02/14/2021 09:58   DG Chest Port 1 View  Result Date: 02/13/2021 CLINICAL DATA:  Follow-up chest tube on the left EXAM: PORTABLE CHEST 1 VIEW COMPARISON:  02/12/2021 FINDINGS: Cardiac shadow is stable. Pigtail catheter is again noted on the left. No definitive pneumothorax is noted. Large cavitary lesion is again noted in the left apex similar to that seen on recent CT examination. Patchy reticulonodular infiltrate is noted throughout the right lung. Consolidation in the left lung base is again seen and stable. No bony abnormality is noted. IMPRESSION: Overall stable appearance of the chest from the most recent CT examination. Large left apex cavitary lesion is noted as well as bilateral infiltrates worse in the left base than the right. Electronically Signed   By: Alcide Clever M.D.   On: 02/13/2021 05:55   DG Chest Portable 1 View  Result Date: 02/12/2021 CLINICAL DATA:  Chest tube placement EXAM: PORTABLE CHEST 1 VIEW COMPARISON:  02/12/2021 FINDINGS: Interval placement of left-sided chest tube with pigtail at the left lateral mid chest. Decreased size of left pneumothorax with decreased mediastinal shift to the right. Possible small loculated air collection within the left upper lung. Small left effusion. Airspace disease throughout the left lung. Slightly nodular appearing opacities in the right upper and lower lung. Probable bullous change at the left apex. Aortic atherosclerosis. IMPRESSION: 1. Interval placement of left-sided chest tube with decreased left  pneumothorax and decreased mediastinal shift to the right. Airspace disease throughout the left lung which may reflect edema or diffuse pneumonia, with small left effusion. Possible small loculated air collection within the left upper lung. 2. Nodular appearing opacities in the right upper and lower lung, question small nodules or nodular infiltrates. CT chest could better evaluate this finding. Electronically Signed   By: Jasmine Pang M.D.   On: 02/12/2021 16:11   DG Chest Portable 1 View  Addendum Date: 02/12/2021   ADDENDUM REPORT: 02/12/2021 14:08 ADDENDUM: Critical Value/emergent results were called by telephone at the time of interpretation on 02/12/2021 at 2:08 pm to provider MATTHEW TRIFAN , who verbally  acknowledged these results. Electronically Signed   By: Beckie SaltsSteven  Reid M.D.   On: 02/12/2021 14:08   Result Date: 02/12/2021 CLINICAL DATA:  Follow-up large left tension pneumothorax. EXAM: PORTABLE CHEST 1 VIEW COMPARISON:  Earlier today at [facility]. FINDINGS: An approximately 90% left hydropneumothorax with severe compression and atelectasis of the lung medially and marked mediastinal shift to the right is again demonstrated. The heart remains normal in size. Vascular crowding in the right lung without significant change. Diffuse osteopenia. IMPRESSION: Approximately 90% left tension hydropneumothorax with severe compression and atelectasis of the lung medially and marked mediastinal shift to the right, unchanged since earlier today. Electronically Signed: By: Beckie SaltsSteven  Reid M.D. On: 02/12/2021 14:00     Scheduled Meds: . atorvastatin  10 mg Oral QPM  . calcitonin (salmon)  1 spray Alternating Nares Daily  . cholecalciferol  1,000 Units Oral Daily  . insulin aspart  0-5 Units Subcutaneous QHS  . insulin aspart  0-9 Units Subcutaneous TID WC  . levothyroxine  50 mcg Oral QAC breakfast  . lidocaine  1 patch Transdermal Q24H   Continuous Infusions:   LOS: 2 days   Time Spent in minutes    45 minutes  Maryann Mikhail D.O. on 02/14/2021 at 12:49 PM  Between 7am to 7pm - Please see pager noted on [email or web address]  After 7pm go to [email or web address]  And look for the night coverage person covering for me after hours  [facility] Office  [phone]

## 2021-02-14 NOTE — Progress Notes (Signed)
[address]       [place] [address]             [phone]        Procedure(s) (LRB): VIDEO BRONCHOSCOPY WITH FLUORO (Left) Subjective: Resting in bed, says she has been wheezing some over night.   Denies shortness of breath.   Objective: Vital signs in last 24 hours: Temp:  [98 F (36.7 C)-101.4 F (38.6 C)] 99.3 F (37.4 C) (03/05 0741) Pulse Rate:  [99-108] 100 (03/05 0741) Cardiac Rhythm: Sinus tachycardia (03/05 0700) Resp:  [19-25] 25 (03/05 0741) BP: (121-136)/(62-78) 136/75 (03/05 0741) SpO2:  [94 %-98 %] 98 % (03/05 0741) Weight:  [44.8 kg] 44.8 kg (03/05 0306)    Intake/Output from previous day: 03/04 0701 - 03/05 0700 In: 550 [P.O.:550] Out: 1420 [Urine:1400; Chest Tube:20] Intake/Output this shift: No intake/output data recorded.  General appearance: alert, cooperative and mild distress Neurologic: intact Heart: RRR Lungs: breath sounds clear on the right, absent in left upper fields, diminished left base. No wheezes at this time.  CXR showing some increase infiltrate on the right, o/w without change.  The left pleural pigtail catheter is secured and connections are tight. Small air leak with cough. Tubing has thin, yellow serous fluid. Drainage 50ml overnight.   Lab Results: Recent Labs    02/12/21 1336 02/14/21 0444  WBC 9.3 16.4*  HGB 12.4 10.9*  HCT 38.8 31.6*  PLT 578* 422*   BMET:  Recent Labs    02/12/21 1336 02/14/21 0444  NA 133* 125*  K 4.3 3.6  CL 97* 91*  CO2 22 24  GLUCOSE 243* 176*  BUN 8 9  CREATININE 0.50 0.41*  CALCIUM 9.2 8.3*    PT/INR: No results for input(s): LABPROT, INR in the last 72 hours. ABG No results found for: PHART, HCO3, TCO2, ACIDBASEDEF, O2SAT CBG (last 3)  Recent Labs    02/13/21 2134 02/14/21 0619 02/14/21 1146  GLUCAP 342* 153* 212*      EXAM: CT ANGIOGRAPHY CHEST WITH CONTRAST  TECHNIQUE: Multidetector CT imaging of the chest was performed using the standard  protocol during bolus administration of intravenous contrast. Multiplanar CT image reconstructions and MIPs were obtained to evaluate the vascular anatomy.  CONTRAST:  81mL OMNIPAQUE IOHEXOL 350 MG/ML SOLN  COMPARISON:  02/12/2021  FINDINGS: Cardiovascular: This is a technically adequate evaluation of the pulmonary vasculature. No filling defects or pulmonary emboli.  The heart is unremarkable without pericardial effusion. Mild dilation and ectasia of the ascending thoracic aorta without frank aneurysm. No evidence of dissection. Minimal atherosclerosis.  Mediastinum/Nodes: No enlarged mediastinal, hilar, or axillary lymph nodes. Thyroid gland, trachea, and esophagus demonstrate no significant findings.  Lungs/Pleura: There is a chest tube within the left lateral pleural space, with near complete resolution of the left-sided pneumothorax seen previously. There is a trace left pleural effusion.  Large thick-walled cavity replaces the majority of the left upper lobe, with dense consolidation throughout the lingula. Similarly, there is a thick-walled cavity within the superior segment of the left lower lobe, with extensive dense airspace disease throughout the remainder of the left lower lobe. There is patchy tree in bud nodular consolidation throughout the right upper lobe. Multiple small pulmonary nodules are identified within the right upper lobe, largest measuring approximately 9 mm in diameter. No right-sided effusion or pneumothorax. The central airways are patent.  Upper Abdomen: No acute abnormality.  Musculoskeletal: Chronic T12 compression fracture is noted. Greater than 50% loss  of height. No retropulsion. No acute bony abnormalities. Reconstructed images demonstrate no additional findings.  Review of the MIP images confirms the above findings.  IMPRESSION: 1. Bilateral areas of airspace disease, much more pronounced throughout the left lung with areas  of dense consolidation and thick-walled cavities as above. Overall, findings favor atypical infection such as fungal pneumonia or even tuberculosis. Sputum analysis may be useful. Cavitating malignancy cannot be completely excluded, though the widespread disease would be atypical. 2. Multiple subcentimeter solid nodules within the right upper lobe, largest measuring 9 mm. 3. Trace residual left pneumothorax with indwelling left chest tube as above. 4. Trace left pleural effusion. 5. No evidence of pulmonary embolus. 6.  Aortic Atherosclerosis (ICD10-I70.0).   Electronically Signed   By: Sharlet Salina M.D.   On: 02/12/2021 22:01     CLINICAL DATA:  Shortness of breath. Evaluate left apex cavitary lesion.  EXAM: PORTABLE CHEST 1 VIEW  COMPARISON:  February 13, 2021  FINDINGS: The left-sided chest tube is stable. The cavitary lesion in the left apex is stable, better appreciated on previous CT imaging. Prominent skin folds are seen over the lateral right chest. No convincing evidence of pneumothorax on the right. Infiltrate throughout the right lung has worsened. Opacity in the left base is dense and stable. No other interval changes.  IMPRESSION: 1. Stable left chest tube. 2. Stable cavitary mass in the left apex better seen on previous CT imaging. 3. Stable dense infiltrate in left base. 4. Increasing patchy infiltrate in the right lung.   Electronically Signed   By: Gerome Sam III M.D   On: 02/14/2021 09:58  Assessment/Plan: S/P Procedure(s) (LRB): VIDEO BRONCHOSCOPY WITH FLUORO (Left)  -64yo former smoker with 15kg Wt loss over the past 3 months admitted with a left tension PTX that has resolved with insertion of a left pigtail pleural tube by the ED physician.  Follow up CT scan shows the left upper lobe and superior segment of the left lower lobe are replace with a thick-walled cavity.  Additionally, there are multiple solid nodules in the right  upper lobe. Differential Dx includes TB, atypical fungal infection, or malignancy.   Pulmonary medicine consulted and further w/u planned with bronchoscopy on Monday, 7/3.  Plan to leave the CT to suction, CXR in AM. We will follow.    LOS: 2 days    Leary Roca, PA-C [phone] 02/14/2021

## 2021-02-14 NOTE — Progress Notes (Signed)
Patient bloods sugar 212. Insulin offered to patient however, patient refused. Patient prefers to have home medication for glucose coverage. Patient aware that Metformin is not an option at this time. MD notified.   RN will continue to monitor.

## 2021-02-15 ENCOUNTER — Inpatient Hospital Stay (HOSPITAL_COMMUNITY): Payer: BLUE CROSS/BLUE SHIELD

## 2021-02-15 DIAGNOSIS — J939 Pneumothorax, unspecified: Secondary | ICD-10-CM | POA: Diagnosis not present

## 2021-02-15 DIAGNOSIS — E113512 Type 2 diabetes mellitus with proliferative diabetic retinopathy with macular edema, left eye: Secondary | ICD-10-CM | POA: Diagnosis not present

## 2021-02-15 DIAGNOSIS — R634 Abnormal weight loss: Secondary | ICD-10-CM | POA: Diagnosis not present

## 2021-02-15 LAB — CBC
HCT: 33 % — ABNORMAL LOW (ref 36.0–46.0)
Hemoglobin: 10.7 g/dL — ABNORMAL LOW (ref 12.0–15.0)
MCH: 26.1 pg (ref 26.0–34.0)
MCHC: 32.4 g/dL (ref 30.0–36.0)
MCV: 80.5 fL (ref 80.0–100.0)
Platelets: 443 10*3/uL — ABNORMAL HIGH (ref 150–400)
RBC: 4.1 MIL/uL (ref 3.87–5.11)
RDW: 14 % (ref 11.5–15.5)
WBC: 13.5 10*3/uL — ABNORMAL HIGH (ref 4.0–10.5)
nRBC: 0 % (ref 0.0–0.2)

## 2021-02-15 LAB — GLUCOSE, CAPILLARY
Glucose-Capillary: 163 mg/dL — ABNORMAL HIGH (ref 70–99)
Glucose-Capillary: 336 mg/dL — ABNORMAL HIGH (ref 70–99)
Glucose-Capillary: 311 mg/dL — ABNORMAL HIGH (ref 70–99)
Glucose-Capillary: 249 mg/dL — ABNORMAL HIGH (ref 70–99)

## 2021-02-15 LAB — BASIC METABOLIC PANEL
Anion gap: 10 (ref 5–15)
BUN: 9 mg/dL (ref 8–23)
CO2: 27 mmol/L (ref 22–32)
Calcium: 8.4 mg/dL — ABNORMAL LOW (ref 8.9–10.3)
Chloride: 91 mmol/L — ABNORMAL LOW (ref 98–111)
Creatinine, Ser: <0.3 mg/dL — ABNORMAL LOW (ref 0.44–1.00)
Glucose, Bld: 137 mg/dL — ABNORMAL HIGH (ref 70–99)
Potassium: 3.7 mmol/L (ref 3.5–5.1)
Sodium: 128 mmol/L — ABNORMAL LOW (ref 135–145)

## 2021-02-15 LAB — URINE CULTURE: Culture: <10000 — AB

## 2021-02-15 NOTE — Progress Notes (Signed)
PROGRESS NOTE    [name] [name]  WGY:[id number] DOB: [birth year] DOA: 02/12/2021 PCP: Truett Perna, MD   Brief Narrative:  HPI On 02/12/2021 by Dr. Fernande Bras is a 65 y.o. female with a PMH significant for type 2 diabetes, recent vertebral fracture, tobacco use, hyperlipidemia, hypothyroidism.  Hallie initially saw her PCP last week for shortness of breath and was scheduled for a chest x-ray which took place earlier today.  She presented to the emergency room from the imaging center via ambulance due to the pneumothorax seen on chest x-ray. In the ED, it was found that they had approximately 90% left tension hydropneumothorax with severe compression and atelectasis of the lung medially and marked mediastinal shift to the right, normal oxygen saturation on room air and not in respiratory distress. They were treated with left chest tube and 2 L oxygen via nasal cannula.  Her vital signs remained stable and interval chest x-ray showed decreased shift. Patient was admitted to medicine service for further workup and management of chronic medical conditions as outlined in detail below.  Additionally, patient endorses 1/2 pack/day for 30-year smoking history.  She stopped smoking 2 years ago.  Denies having any mammograms or colonoscopies and declines those cancer screenings at this time.  Patient has had recent unintentional weight loss and she attributes this to her back pain from recent fall.  Patient was treated approximately 3 weeks ago for vertebral compression fracture after fall in the bathroom.  At baseline, patient can ambulate and perform ADLs without difficulty.  Interim history Admitted for pneumothorax. CT chest obtained showing cavitary lesion. Patient with chest tube in place and cardiothoracic surgery following.  Have consulted pulmonology for possible bronch. Assessment & Plan   Dyspnea secondary to spontaneous left tension pneumothorax -Status post left pigtail-  will be placed to water seal today -CT surgery consulted and appreciated -High concern for malignancy or infection  Possible cavitary lesion  -CTA chest showed bilateral areas of airspace disease, dense consolidation and thick walled cavities, findings favor atypical infection or fungal pneumonia or TB.  Cavitating malignancy cannot be excluded.  Multiple subcentimeter solid nodules in the right upper lobe, measuring 9 mm  -Chest x-ray today showed large left apex cavitary lesion as well as bilateral infiltrates worse in the left base than the right -Pulmonology consulted and appreciated-planning possible bronchoscopy on 02/16/2021 -Placed on airborne precautions -Respiratory culture showing few gram-positive cocci, rare Gram variable rods  SIRS -Patient with leukocytosis today of 16.4, tachycardia as well as tachypnea -Low-grade fever of 99.3 F -Possibly related to the above -UA unremarkable for infection. Urine culture showed <10K of insignificant growth -Chest x-ray as above -Blood cultures no growth to date -Will hold off on starting antibiotics at this time  Diabetes mellitus, type II, poorly controlled -Hemoglobin A1c 10.8 -Home medications Amaryl and Metformin were held -Patient continues to refuse insulin sliding scale -We will continue CBG monitoring -Have had a long conversation with patient and explained the reasoning for being on insulin however she refuses -Continue amaryl   Hypothyroidism -Continue Synthroid  Thoracic vertebral compression fracture -Present on admission -Continue nasal calcitonin, PRN tramadol and morphine, lidocaine patch -PT and OT recommending home health  Mild hyponatremia -Patient asymptomatic -Sodium currently 128, will continue to monitor BMP  Malnutrition/underweight -BMI 15.95  -Nutrition consulted  DVT Prophylaxis  SCDs  Code Status: Full  Family Communication: None at bedside  Disposition Plan:  Status is:  Inpatient  Remains inpatient appropriate because:Inpatient level of care  appropriate due to severity of illness   Dispo: The patient is from: Home              Anticipated d/c is to: Home              Patient currently is not medically stable to d/c.   Difficult to place patient No   Consultants Cardiothoracic surgery PCCM  Procedures  Pigtail placement  Antibiotics   Anti-infectives (From admission, onward)   None      Subjective:   [name] [name] seen and examined today.  Denies current shortness of breath, chest pain, abdominal pain, nausea or vomiting, diarrhea or constipation, dizziness or headache.   Objective:   Vitals:   02/14/21 1545 02/14/21 2022 02/15/21 0022 02/15/21 1344  BP: 120/79 (!) 137/91  116/71  Pulse: 94 90  85  Resp: Temp: 98.4 F (36.9 C) 99.4 F (37.4 C)  98.5 F (36.9 C)  TempSrc: Oral Oral  Oral  SpO2: 96% 96%  99%  Weight:   44.6 kg   Height:        Intake/Output Summary (Last 24 hours) at 02/15/2021 1412 Last data filed at 02/15/2021 1100 Gross per 24 hour  Intake 125 ml  Output 1420 ml  Net -1295 ml   Filed Weights   02/13/21 0222 02/14/21 0306 02/15/21 0022  Weight: 44.7 kg 44.8 kg 44.6 kg   Exam  General: Well developed, frail, NAD  HEENT: NCAT, mucous membranes moist.   Cardiovascular: S1 S2 auscultated, RRR, no  murmur  Respiratory: Diminished breath sounds on left, chest tube in place  Abdomen: Soft, nontender, nondistended, + bowel sounds  Extremities: warm dry without cyanosis clubbing or edema  Neuro: AAOx3,nonfocal   Psych:Pleasant, appropriate mood and affect  Data Reviewed: I have personally reviewed following labs and imaging studies  CBC: Recent Labs  Lab 02/12/21 1336 02/14/21 0444 02/15/21 0327  WBC 9.3 16.4* 13.5*  NEUTROABS 6.8  --   --   HGB 12.4 10.9* 10.7*  HCT 38.8 31.6* 33.0*  MCV 83.4 78.6* 80.5  PLT 578* 422* 443*   Basic Metabolic Panel: Recent Labs  Lab  02/12/21 1336 02/14/21 0444 02/15/21 0327  NA 133* 125* 128*  K 4.3 3.6 3.7  CL 97* 91* 91*  CO2 GLUCOSE 243* 176* 137*  BUN CREATININE 0.50 0.41* <0.30*  CALCIUM 9.2 8.3* 8.4*   GFR: CrCl cannot be calculated (This lab value cannot be used to calculate CrCl because it is not a number: <0.30). Liver Function Tests: No results for input(s): AST, ALT, ALKPHOS, BILITOT, PROT, ALBUMIN in the last 168 hours. No results for input(s): LIPASE, AMYLASE in the last 168 hours. No results for input(s): AMMONIA in the last 168 hours. Coagulation Profile: No results for input(s): INR, PROTIME in the last 168 hours. Cardiac Enzymes: No results for input(s): CKTOTAL, CKMB, CKMBINDEX, TROPONINI in the last 168 hours. BNP (last 3 results) No results for input(s): PROBNP in the last 8760 hours. HbA1C: Recent Labs    02/13/21 0357  HGBA1C 10.8*   CBG: Recent Labs  Lab 02/14/21 1146 02/14/21 1626 02/14/21 2115 02/15/21 0633 02/15/21 1151  GLUCAP 212* 215* 224* 163* 336*   Lipid Profile: No results for input(s): CHOL, HDL, LDLCALC, TRIG, CHOLHDL, LDLDIRECT in the last 72 hours. Thyroid Function Tests: No results for input(s): TSH, T4TOTAL, FREET4, T3FREE, THYROIDAB in the last 72 hours. Anemia Panel: No results for  input(s): VITAMINB12, FOLATE, FERRITIN, TIBC, IRON, RETICCTPCT in the last 72 hours. Urine analysis:    Component Value Date/Time   COLORURINE YELLOW 02/13/2021 1900   APPEARANCEUR CLEAR 02/13/2021 1900   LABSPEC 1.013 02/13/2021 1900   PHURINE 6.0 02/13/2021 1900   GLUCOSEU 150 (A) 02/13/2021 1900   HGBUR SMALL (A) 02/13/2021 1900   HGBUR negative 01/19/2008 0750   BILIRUBINUR NEGATIVE 02/13/2021 1900   KETONESUR NEGATIVE 02/13/2021 1900   PROTEINUR NEGATIVE 02/13/2021 1900   UROBILINOGEN 0.2 01/19/2008 0750   NITRITE NEGATIVE 02/13/2021 1900   LEUKOCYTESUR NEGATIVE 02/13/2021 1900   Sepsis  Labs: @LABRCNTIP (procalcitonin:4,lacticidven:4)  ) Recent Results (from the past 240 hour(s))  Resp Panel by RT-PCR (Flu A&B, Covid) Nasopharyngeal Swab     Status: None   Collection Time: 02/12/21  3:44 PM   Specimen: Nasopharyngeal Swab; Nasopharyngeal(NP) swabs in vial transport medium  Result Value Ref Range Status   SARS Coronavirus 2 by RT PCR NEGATIVE NEGATIVE Final    Comment: (NOTE) SARS-CoV-2 target nucleic acids are NOT DETECTED.  The SARS-CoV-2 RNA is generally detectable in upper respiratory specimens during the acute phase of infection. The lowest concentration of SARS-CoV-2 viral copies this assay can detect is 138 copies/mL. A negative result does not preclude SARS-Cov-2 infection and should not be used as the sole basis for treatment or other patient management decisions. A negative result may occur with  improper specimen collection/handling, submission of specimen other than nasopharyngeal swab, presence of viral mutation(s) within the areas targeted by this assay, and inadequate number of viral copies(<138 copies/mL). A negative result must be combined with clinical observations, patient history, and epidemiological information. The expected result is Negative.  Fact Sheet for Patients:  BloggerCourse.comhttps://www.fda.gov/media/152166/download  Fact Sheet for Healthcare Providers:  SeriousBroker.ithttps://www.fda.gov/media/152162/download  This test is no t yet approved or cleared by the [place] FDA and  has been authorized for detection and/or diagnosis of SARS-CoV-2 by FDA under an Emergency Use Authorization (EUA). This EUA will remain  in effect (meaning this test can be used) for the duration of the COVID-19 declaration under Section 564(b)(1) of the Act, 21 U.S.C.section 360bbb-3(b)(1), unless the authorization is terminated  or revoked sooner.       Influenza A by PCR NEGATIVE NEGATIVE Final   Influenza B by PCR NEGATIVE NEGATIVE Final    Comment: (NOTE) The Xpert  Xpress SARS-CoV-2/FLU/RSV plus assay is intended as an aid in the diagnosis of influenza from Nasopharyngeal swab specimens and should not be used as a sole basis for treatment. Nasal washings and aspirates are unacceptable for Xpert Xpress SARS-CoV-2/FLU/RSV testing.  Fact Sheet for Patients: BloggerCourse.comhttps://www.fda.gov/media/152166/download  Fact Sheet for Healthcare Providers: SeriousBroker.ithttps://www.fda.gov/media/152162/download  This test is not yet approved or cleared by the [place] FDA and has been authorized for detection and/or diagnosis of SARS-CoV-2 by FDA under an Emergency Use Authorization (EUA). This EUA will remain in effect (meaning this test can be used) for the duration of the COVID-19 declaration under Section 564(b)(1) of the Act, 21 U.S.C. section 360bbb-3(b)(1), unless the authorization is terminated or revoked.  Performed at [facility], [address] [place], [place] [id number]   Culture, blood (routine x 2)     Status: None (Preliminary result)   Collection Time: 02/13/21  2:25 PM   Specimen: BLOOD  Result Value Ref Range Status   Specimen Description BLOOD LEFT ANTECUBITAL  Final   Special Requests   Final    BOTTLES DRAWN AEROBIC AND ANAEROBIC Blood Culture adequate  volume   Culture   Final    NO GROWTH 2 DAYS Performed at [facility], [address], [place], [place] [address]    Report Status PENDING  Incomplete  Culture, blood (routine x 2)     Status: None (Preliminary result)   Collection Time: 02/13/21  2:29 PM   Specimen: BLOOD  Result Value Ref Range Status   Specimen Description BLOOD RIGHT ANTECUBITAL  Final   Special Requests   Final    BOTTLES DRAWN AEROBIC AND ANAEROBIC Blood Culture adequate volume   Culture   Final    NO GROWTH 2 DAYS Performed at [facility], [address], [place], [place] [address]    Report Status PENDING  Incomplete  Expectorated Sputum Assessment w Gram Stain, Rflx to Resp Cult     Status: None    Collection Time: 02/13/21  3:13 PM   Specimen: Expectorated Sputum  Result Value Ref Range Status   Specimen Description EXPECTORATED SPUTUM  Final   Special Requests Normal  Final   Sputum evaluation   Final    THIS SPECIMEN IS ACCEPTABLE FOR SPUTUM CULTURE Performed at [facility], [address], [place], [place] [address]    Report Status 02/14/2021 FINAL  Final  Culture, Respiratory w Gram Stain     Status: None (Preliminary result)   Collection Time: 02/13/21  3:13 PM  Result Value Ref Range Status   Specimen Description EXPECTORATED SPUTUM  Final   Special Requests Normal Reflexed from [id number]  Final   Gram Stain   Final    MODERATE WBC PRESENT,BOTH PMN AND MONONUCLEAR FEW GRAM POSITIVE COCCI RARE GRAM VARIABLE ROD    Culture   Final    CULTURE REINCUBATED FOR BETTER GROWTH Performed at [facility], [address] [place], [place] [address]    Report Status PENDING  Incomplete  Culture, Urine     Status: Abnormal   Collection Time: 02/13/21 10:03 PM   Specimen: Urine, Clean Catch  Result Value Ref Range Status   Specimen Description URINE, CLEAN CATCH  Final   Special Requests NONE  Final   Culture (A)  Final    <10,000 COLONIES/mL INSIGNIFICANT GROWTH Performed at [facility], [address], [place], [place] [address]    Report Status 02/15/2021 FINAL  Final      Radiology Studies: DG CHEST PORT 1 VIEW  Result Date: 02/15/2021 CLINICAL DATA:  Evaluate chest tube. EXAM: PORTABLE CHEST 1 VIEW COMPARISON:  February 14, 2021 FINDINGS: The known cavitary mass on the left is stable. A left chest tube remains in place. Dense opacity remains in the left mid and lower lung. Prominent skin folds are seen on the right. There appear to be lung markings on both sides of the skin folds without convincing evidence of pneumothorax. Patchy opacity throughout the right lung is stable. No other changes. IMPRESSION: 1. No change in left chest tube, dense left basilar  infiltrate, or known cavitary lesion in the left apex. 2. No change in patchy right-sided pulmonary opacities. 3. Prominent skin folds on the right somewhat limiting evaluation but there is no convincing evidence of pneumothorax. Electronically Signed   By: Gerome Sam III M.D   On: 02/15/2021 08:47   DG CHEST PORT 1 VIEW  Result Date: 02/14/2021 CLINICAL DATA:  Shortness of breath. Evaluate left apex cavitary lesion. EXAM: PORTABLE CHEST 1 VIEW COMPARISON:  February 13, 2021 FINDINGS: The left-sided chest tube  is stable. The cavitary lesion in the left apex is stable, better appreciated on previous CT imaging. Prominent skin folds are seen over the lateral right chest. No convincing evidence of pneumothorax on the right. Infiltrate throughout the right lung has worsened. Opacity in the left base is dense and stable. No other interval changes. IMPRESSION: 1. Stable left chest tube. 2. Stable cavitary mass in the left apex better seen on previous CT imaging. 3. Stable dense infiltrate in left base. 4. Increasing patchy infiltrate in the right lung. Electronically Signed   By: Gerome Sam III M.D   On: 02/14/2021 09:58     Scheduled Meds: . atorvastatin  10 mg Oral QPM  . calcitonin (salmon)  1 spray Alternating Nares Daily  . cholecalciferol  1,000 Units Oral Daily  . glimepiride  1 mg Oral Q breakfast  . insulin aspart  0-5 Units Subcutaneous QHS  . insulin aspart  0-9 Units Subcutaneous TID WC  . levothyroxine  50 mcg Oral QAC breakfast  . lidocaine  1 patch Transdermal Q24H   Continuous Infusions:   LOS: 3 days   Time Spent in minutes   45 minutes  Maryann Mikhail D.O. on 02/15/2021 at 2:12 PM  Between 7am to 7pm - Please see pager noted on [email or web address]  After 7pm go to [email or web address]  And look for the night coverage person covering for me after hours  [facility] Office  [phone]

## 2021-02-15 NOTE — Anesthesia Preprocedure Evaluation (Addendum)
Anesthesia Evaluation  Patient identified by MRN, date of birth, ID band Patient awake    Reviewed: Allergy & Precautions, NPO status , Patient's Chart, lab work & pertinent test results  History of Anesthesia Complications Negative for: history of anesthetic complications  Airway Mallampati: II  TM Distance: >3 FB Neck ROM: Full    Dental  (+) Missing,    Pulmonary former smoker,  Spontaneous left ptx s/p chest tube   Pulmonary exam normal        Cardiovascular negative cardio ROS Normal cardiovascular exam     Neuro/Psych negative neurological ROS  negative psych ROS   GI/Hepatic negative GI ROS, Neg liver ROS,   Endo/Other  diabetes, Poorly Controlled, Type 2, Oral Hypoglycemic AgentsHypothyroidism Malnutrition, BMI 15.9  Renal/GU negative Renal ROS  negative genitourinary   Musculoskeletal negative musculoskeletal ROS (+)   Abdominal   Peds  Hematology negative hematology ROS (+)   Anesthesia Other Findings Day of surgery medications reviewed with patient.  Reproductive/Obstetrics negative OB ROS                            Anesthesia Physical Anesthesia Plan  ASA: III  Anesthesia Plan: General   Post-op Pain Management:    Induction: Intravenous  PONV Risk Score and Plan: 3 and Propofol infusion, Treatment may vary due to age or medical condition, Ondansetron and Midazolam  Airway Management Planned: Oral ETT  Additional Equipment: None  Intra-op Plan:   Post-operative Plan: Extubation in OR  Informed Consent: I have reviewed the patients History and Physical, chart, labs and discussed the procedure including the risks, benefits and alternatives for the proposed anesthesia with the patient or authorized representative who has indicated his/her understanding and acceptance.     Dental advisory given  Plan Discussed with: CRNA  Anesthesia Plan Comments:         Anesthesia Quick Evaluation

## 2021-02-15 NOTE — Progress Notes (Addendum)
Procedure(s) (LRB): VIDEO BRONCHOSCOPY WITH FLUORO (Left) Subjective: comfortable  Objective: Vital signs in last 24 hours: Temp:  [98.4 F (36.9 C)-99.4 F (37.4 C)] 99.4 F (37.4 C) (03/05 2022) Pulse Rate:  [90-94] 90 (03/05 2022) Cardiac Rhythm: Normal sinus rhythm (03/06 0727) Resp:  [18] 18 (03/05 2022) BP: (120-137)/(79-91) 137/91 (03/05 2022) SpO2:  [96 %] 96 % (03/05 2022) Weight:  [44.6 kg] 44.6 kg (03/06 0022)  Hemodynamic parameters for last 24 hours:    Intake/Output from previous day: 03/05 0701 - 03/06 0700 In: 125 [P.O.:125] Out: 870 [Urine:800; Chest Tube:70] Intake/Output this shift: No intake/output data recorded.  General appearance: alert, cooperative and no distress Neurologic: intact Lungs: diminished, coarse BS on left no air leak  Lab Results: Recent Labs    02/14/21 0444 02/15/21 0327  WBC 16.4* 13.5*  HGB 10.9* 10.7*  HCT 31.6* 33.0*  PLT 422* 443*   BMET:  Recent Labs    02/14/21 0444 02/15/21 0327  NA 125* 128*  K 3.6 3.7  CL 91* 91*  CO2 24 27  GLUCOSE 176* 137*  BUN 9 9  CREATININE 0.41* <0.30*  CALCIUM 8.3* 8.4*    PT/INR: No results for input(s): LABPROT, INR in the last 72 hours. ABG No results found for: PHART, HCO3, TCO2, ACIDBASEDEF, O2SAT CBG (last 3)  Recent Labs    02/14/21 1626 02/14/21 2115 02/15/21 0633  GLUCAP 215* 224* 163*    Assessment/Plan: S/P Procedure(s) (LRB): VIDEO BRONCHOSCOPY WITH FLUORO (Left) - No air leak from CT today- place to water seal Some purulent fluid in Pleuravac, serous fluid in tubing CXR unchanged For bronchoscopy tomorrow    LOS: 3 days    Loreli Slot 02/15/2021

## 2021-02-16 ENCOUNTER — Encounter (HOSPITAL_COMMUNITY)
Admission: EM | Disposition: A | Discharge status: Hospice | Payer: Self-pay | Source: Home / Self Care | Attending: Internal Medicine

## 2021-02-16 ENCOUNTER — Encounter (HOSPITAL_COMMUNITY): Payer: Self-pay | Admitting: Internal Medicine

## 2021-02-16 ENCOUNTER — Inpatient Hospital Stay (HOSPITAL_COMMUNITY): Payer: BLUE CROSS/BLUE SHIELD

## 2021-02-16 ENCOUNTER — Inpatient Hospital Stay (HOSPITAL_COMMUNITY): Payer: BLUE CROSS/BLUE SHIELD | Admitting: Anesthesiology

## 2021-02-16 DIAGNOSIS — R634 Abnormal weight loss: Secondary | ICD-10-CM | POA: Diagnosis not present

## 2021-02-16 DIAGNOSIS — J939 Pneumothorax, unspecified: Secondary | ICD-10-CM | POA: Diagnosis not present

## 2021-02-16 DIAGNOSIS — E440 Moderate protein-calorie malnutrition: Secondary | ICD-10-CM | POA: Diagnosis present

## 2021-02-16 DIAGNOSIS — E113512 Type 2 diabetes mellitus with proliferative diabetic retinopathy with macular edema, left eye: Secondary | ICD-10-CM | POA: Diagnosis not present

## 2021-02-16 HISTORY — PX: BRONCHIAL WASHINGS: SHX5105

## 2021-02-16 HISTORY — PX: BRONCHIAL BRUSHINGS: SHX5108

## 2021-02-16 HISTORY — PX: BRONCHIAL BIOPSY: SHX5109

## 2021-02-16 HISTORY — PX: VIDEO BRONCHOSCOPY: SHX5072

## 2021-02-16 LAB — BODY FLUID CELL COUNT WITH DIFFERENTIAL
Eos, Fluid: 0 %
Eos, Fluid: 0 %
Lymphs, Fluid: 0 %
Lymphs, Fluid: 0 %
Monocyte-Macrophage-Serous Fluid: 6 % — ABNORMAL LOW (ref 50–90)
Monocyte-Macrophage-Serous Fluid: 2 % — ABNORMAL LOW (ref 50–90)
Neutrophil Count, Fluid: 94 % — ABNORMAL HIGH (ref 0–25)
Neutrophil Count, Fluid: 98 % — ABNORMAL HIGH (ref 0–25)
Total Nucleated Cell Count, Fluid: 1875 cu mm — ABNORMAL HIGH (ref 0–1000)
Total Nucleated Cell Count, Fluid: 690 cu mm (ref 0–1000)

## 2021-02-16 LAB — GLUCOSE, CAPILLARY
Glucose-Capillary: 212 mg/dL — ABNORMAL HIGH (ref 70–99)
Glucose-Capillary: 260 mg/dL — ABNORMAL HIGH (ref 70–99)
Glucose-Capillary: 232 mg/dL — ABNORMAL HIGH (ref 70–99)
Glucose-Capillary: 317 mg/dL — ABNORMAL HIGH (ref 70–99)

## 2021-02-16 LAB — CBC
HCT: 34.4 % — ABNORMAL LOW (ref 36.0–46.0)
Hemoglobin: 11.1 g/dL — ABNORMAL LOW (ref 12.0–15.0)
MCH: 25.8 pg — ABNORMAL LOW (ref 26.0–34.0)
MCHC: 32.3 g/dL (ref 30.0–36.0)
MCV: 80 fL (ref 80.0–100.0)
Platelets: 459 10*3/uL — ABNORMAL HIGH (ref 150–400)
RBC: 4.3 MIL/uL (ref 3.87–5.11)
RDW: 13.8 % (ref 11.5–15.5)
WBC: 10.6 10*3/uL — ABNORMAL HIGH (ref 4.0–10.5)
nRBC: 0 % (ref 0.0–0.2)

## 2021-02-16 LAB — BASIC METABOLIC PANEL
Anion gap: 9 (ref 5–15)
BUN: 8 mg/dL (ref 8–23)
CO2: 28 mmol/L (ref 22–32)
Calcium: 8.5 mg/dL — ABNORMAL LOW (ref 8.9–10.3)
Chloride: 92 mmol/L — ABNORMAL LOW (ref 98–111)
Creatinine, Ser: 0.31 mg/dL — ABNORMAL LOW (ref 0.44–1.00)
GFR, Estimated: >60 mL/min (ref >60)
Glucose, Bld: 233 mg/dL — ABNORMAL HIGH (ref 70–99)
Potassium: 3.8 mmol/L (ref 3.5–5.1)
Sodium: 129 mmol/L — ABNORMAL LOW (ref 135–145)

## 2021-02-16 LAB — CULTURE, RESPIRATORY W GRAM STAIN
Culture: NORMAL
Special Requests: NORMAL

## 2021-02-16 SURGERY — BRONCHOSCOPY, WITH FLUOROSCOPY
Anesthesia: Monitor Anesthesia Care | Laterality: Left

## 2021-02-16 MED ORDER — ONDANSETRON HCL 4 MG/2ML IJ SOLN
INTRAMUSCULAR | Status: DC | PRN
  Administered 2021-02-16: 4 mg via INTRAVENOUS

## 2021-02-16 MED ORDER — LACTATED RINGERS IV SOLN: INTRAVENOUS | Status: DC | PRN

## 2021-02-16 MED ORDER — SUGAMMADEX SODIUM 200 MG/2ML IV SOLN
INTRAVENOUS | Status: DC | PRN
  Administered 2021-02-16: 200 mg via INTRAVENOUS

## 2021-02-16 MED ORDER — FENTANYL CITRATE (PF) 100 MCG/2ML IJ SOLN
INTRAMUSCULAR | Status: DC | PRN
  Administered 2021-02-16: 50 ug via INTRAVENOUS

## 2021-02-16 MED ORDER — ROCURONIUM BROMIDE 100 MG/10ML IV SOLN
INTRAVENOUS | Status: DC | PRN
  Administered 2021-02-16: 30 mg via INTRAVENOUS

## 2021-02-16 MED ORDER — PROPOFOL 10 MG/ML IV BOLUS
INTRAVENOUS | Status: DC | PRN
  Administered 2021-02-16: 80 mg via INTRAVENOUS

## 2021-02-16 MED ORDER — LACTATED RINGERS IV SOLN
INTRAVENOUS | Status: AC | PRN
  Administered 2021-02-16: 1000 mL via INTRAVENOUS

## 2021-02-16 MED ORDER — LIDOCAINE 2% (20 MG/ML) 5 ML SYRINGE
INTRAMUSCULAR | Status: DC | PRN
  Administered 2021-02-16: 60 mg via INTRAVENOUS

## 2021-02-16 NOTE — Progress Notes (Addendum)
[address]       [place] [address]             [phone]        Procedure(s) (LRB): VIDEO BRONCHOSCOPY WITH FLUORO (Left) Subjective: Resting in bed, says her breathing is "much better". Denies shortness of breath.   Objective: Vital signs in last 24 hours: Temp:  [98.5 F (36.9 C)-99.6 F (37.6 C)] 99.3 F (37.4 C) (03/07 0434) Pulse Rate:  [83-105] 105 (03/07 0434) Cardiac Rhythm: Normal sinus rhythm (03/07 0738) Resp:  [17-20] 17 (03/07 0434) BP: (116-132)/(71-79) 122/71 (03/07 0434) SpO2:  [94 %-99 %] 94 % (03/07 0434) Weight:  [45 kg] 45 kg (03/07 0000)    Intake/Output from previous day: 03/06 0701 - 03/07 0700 In: 480 [P.O.:480] Out: 1500 [Urine:1500] Intake/Output this shift: No intake/output data recorded.  General appearance: alert, cooperative and mild distress Neurologic: intact Heart: RRR Lungs: breath sounds clear on the right, absent in left upper fields, diminished left base. No wheezes.  CXR is stable with CT to water seal past 24 hours. No air leak.   Drainage from CT not recorded.   Lab Results: Recent Labs    02/15/21 0327 02/16/21 0459  WBC 13.5* 10.6*  HGB 10.7* 11.1*  HCT 33.0* 34.4*  PLT 443* 459*   BMET:  Recent Labs    02/15/21 0327 02/16/21 0459  NA 128* 129*  K 3.7 3.8  CL 91* 92*  CO2 27 28  GLUCOSE 137* 233*  BUN 9 8  CREATININE <0.30* 0.31*  CALCIUM 8.4* 8.5*    PT/INR: No results for input(s): LABPROT, INR in the last 72 hours. ABG No results found for: PHART, HCO3, TCO2, ACIDBASEDEF, O2SAT CBG (last 3)  Recent Labs    02/15/21 1717 02/15/21 2206 02/16/21 0559  GLUCAP 311* 249* 212*      EXAM: CT ANGIOGRAPHY CHEST WITH CONTRAST  TECHNIQUE: Multidetector CT imaging of the chest was performed using the standard protocol during bolus administration of intravenous contrast. Multiplanar CT image reconstructions and MIPs were obtained to evaluate the vascular  anatomy.  CONTRAST:  24mL OMNIPAQUE IOHEXOL 350 MG/ML SOLN  COMPARISON:  02/12/2021  FINDINGS: Cardiovascular: This is a technically adequate evaluation of the pulmonary vasculature. No filling defects or pulmonary emboli.  The heart is unremarkable without pericardial effusion. Mild dilation and ectasia of the ascending thoracic aorta without frank aneurysm. No evidence of dissection. Minimal atherosclerosis.  Mediastinum/Nodes: No enlarged mediastinal, hilar, or axillary lymph nodes. Thyroid gland, trachea, and esophagus demonstrate no significant findings.  Lungs/Pleura: There is a chest tube within the left lateral pleural space, with near complete resolution of the left-sided pneumothorax seen previously. There is a trace left pleural effusion.  Large thick-walled cavity replaces the majority of the left upper lobe, with dense consolidation throughout the lingula. Similarly, there is a thick-walled cavity within the superior segment of the left lower lobe, with extensive dense airspace disease throughout the remainder of the left lower lobe. There is patchy tree in bud nodular consolidation throughout the right upper lobe. Multiple small pulmonary nodules are identified within the right upper lobe, largest measuring approximately 9 mm in diameter. No right-sided effusion or pneumothorax. The central airways are patent.  Upper Abdomen: No acute abnormality.  Musculoskeletal: Chronic T12 compression fracture is noted. Greater than 50% loss of height. No retropulsion. No acute bony abnormalities. Reconstructed images demonstrate no additional findings.  Review of the MIP images confirms the above findings.  IMPRESSION: 1. Bilateral areas of airspace disease, much more pronounced throughout the left lung with areas of dense consolidation and thick-walled cavities as above. Overall, findings favor atypical infection such as fungal pneumonia or even  tuberculosis. Sputum analysis may be useful. Cavitating malignancy cannot be completely excluded, though the widespread disease would be atypical. 2. Multiple subcentimeter solid nodules within the right upper lobe, largest measuring 9 mm. 3. Trace residual left pneumothorax with indwelling left chest tube as above. 4. Trace left pleural effusion. 5. No evidence of pulmonary embolus. 6.  Aortic Atherosclerosis (ICD10-I70.0).   Electronically Signed   By: Sharlet Salina M.D.   On: 02/12/2021 22:01      CLINICAL DATA:  Chest tube  TB?  EXAM: PORTABLE CHEST 1 VIEW  COMPARISON:  02/15/2021  FINDINGS: Left chest tube unchanged in position. Left upper lobe cavitary mass again seen. Focal airspace opacity again noted within the right upper lobe. Cardiac silhouette obscured by left lung base opacity.  IMPRESSION: No significant interval change in appearance of the chest left upper lobe cavitary mass and right upper and left lower lobe airspace opacity again seen.   Electronically Signed   By: Acquanetta Belling M.D.   On: 02/16/2021 08:00    Assessment/Plan: S/P Procedure(s) (LRB): VIDEO BRONCHOSCOPY WITH FLUORO (Left)  -65yo former smoker with 15kg Wt loss over the past 3 months admitted with a left tension PTX that has resolved with insertion of a left pigtail pleural tube by the ED physician.  Follow up CT scan shows the left upper lobe and superior segment of the left lower lobe are replace with a thick-walled cavity.  Additionally, there are multiple solid nodules in the right upper lobe. Differential Dx includes TB, atypical fungal infection, or malignancy.   Pulmonary medicine consulted and further w/u planned with bronchoscopy later today.  Plan to leave the CT to water seal for now. I    LOS: 4 days    Leary Roca, PA-C [phone] 02/16/2021   I have seen and examined the patient and agree with the assessment and plan as outlined.  Purcell Nails, MD 02/16/2021 2:58 PM

## 2021-02-16 NOTE — Progress Notes (Signed)
Inpatient Diabetes Program Recommendations  AACE/ADA: New Consensus Statement on Inpatient Glycemic Control   Target Ranges:  Prepandial:   less than 140 mg/dL      Peak postprandial:   less than 180 mg/dL (1-2 hours)      Critically ill patients:  140 - 180 mg/dL   Results for [name] [name], [name] [name] (MRN [id number]) as of 02/16/2021 08:52  Ref. Range 02/15/2021 06:33 02/15/2021 11:51 02/15/2021 17:17 02/15/2021 22:06 02/16/2021 05:59  Glucose-Capillary Latest Ref Range: 70 - 99 mg/dL 643 (H) 329 (H) 518 (H) 249 (H) 212 (H)  Results for [name] [name], [name] [name] (MRN [id number]) as of 02/16/2021 08:52  Ref. Range 02/13/2021 03:57  Hemoglobin A1C Latest Ref Range: 4.8 - 5.6 % 10.8 (H)   Review of Glycemic Control  Diabetes history: DM2 Outpatient Diabetes medications: Amaryl 2 mg QAM Current orders for Inpatient glycemic control: Amaryl 1 mg QAM, Novolog 0-9 units TID with meals, Novolog 0-5 units QHS  Inpatient Diabetes Program Recommendations:    Oral DM medications: Noted patient is refusing Novolog correction. If patient will not agree to take insulin while inpatient, please consider increasing Amaryl to 2 mg daily.  HbgA1C: A1C 10.8% on 02/13/21 indicating an average glucose of 263 mg/dl to evaluate glycemic control over the past 2-3 months. Prior A1C in Feb 2022 was 11.9% (per Endocrinology note). Would strongly recommend patient's outpatient DM medications be adjusted in order to get DM under better control.   NOTE: Per chart noted patient sees Dr. Kathreen Cosier (Endocrinologist) and was last seen on 01/26/21. Per office note by Dr. Kathreen Cosier on 01/26/21, "Patient is from [place] and was going back to [place] for annual exams and med refills.However reports she cannot travel back to [place] at this time. Reports her A1C's have run between 9-14%, and she has been admitted several times while in [place] for hyperglycemia. She has refused insulin therapy in the past. She has refused additional therapy for her diabetes, or changes  in her regimen in the past. Although it is not ideal, we will continue metformin and increase her glimepiride." Patient's A1C was noted to be 11.9% and patient was asked to continue Metformin 1000 mg BID and increase Amaryl to 4 mg QAM.   Thanks, Orlando Penner, RN, MSN, CDE Diabetes Coordinator Inpatient Diabetes Program [phone] (Team Pager from 8am to 5pm)

## 2021-02-16 NOTE — Op Note (Signed)
Bronchoscopy Procedure Note  [name] [name]  [id number]  02-22-1956  Date:02/16/21  Time:3:08 PM   Provider Performing:Jonathan B Dewald   Procedure(s):  Flexible bronchoscopy with bronchial alveolar lavage (76734) and Transbronchial lung biopsy, single lobe (19379)  Indication(s) Left Upper Lobe Cavitary Lesion  Consent Risks of the procedure as well as the alternatives and risks of each were explained to the patient and/or caregiver.  Consent for the procedure was obtained and is signed in the bedside chart  Anesthesia General   Time Out Verified patient identification, verified procedure, site/side was marked, verified correct patient position, special equipment/implants available, medications/allergies/relevant history reviewed, required imaging and test results available.   Sterile Technique Usual hand hygiene, masks, gowns, and gloves were used   Procedure Description Bronchoscope advanced through endotracheal tube and into airway.  Airways were examined down to subsegmental level with findings noted below.   Following diagnostic evaluation, BAL(s) performed in LUL and lingula with normal saline and return of 30cc fluid from both areas. Brushings performed in the left upper lobe and lingula as well. Transbronchial biopsies performed of the left upper lobe.   Findings:  Normal caliber trachea. Purulent secretions present in the left main stem bronchi coming from the left upper lobe entrance. Otherwise normal airway mucosa noted. The right bronchial tree contained purulent secretions at the entrance of the right upper lobe but otherwise normal appearing mucosa.    Complications/Tolerance None; patient tolerated the procedure well. Chest X-ray is needed post procedure.   EBL Minimal   Specimen(s) LUL BAL - cytology, cell count/diff along with AFB, respiratory and fungal cultures Lingula BAL - cytology, cell count/diff along with AFB, respiratory and fungal  cultures Brushings of the LUL and Lingula - for cytology Forcep Biopsies of the LUL - for cytology and surgical path

## 2021-02-16 NOTE — Consult Note (Signed)
NAMEMaudean [name], MRN:  [id number], DOB:  04-Apr-1956, LOS: 4 ADMISSION DATE:  02/12/2021, CONSULTATION DATE:  02/13/2021 REFERRING MD:  Curly Shores MD, CHIEF COMPLAINT: Abnormal CT scan, rule out TB  Brief History:   65 year old [place] with diabetes, vertebral fracture, tobacco use.  Presenting with large left hydropneumothorax s/p chest tube placement in the ED. CT shows airspace consolidation, cavitary lesions left greater than right.  She is febrile today prompting placement in airborne isolation due to concern for TB and PCCM consultation for possible bronchoscopy Complains of chronic cough, occasional sputum production.  Past Medical History:   Diabetes, hyperlipidemia, thyroid disease  Social history notable for 15-pack-year smoker.  Quit in 2020.  She denies exposure to TB, no being homeless or incarcerated. She is an immigrant from [place] over 20 years ago but still travels back every year.  Used to work as a Corporate treasurer.  Denies any significant occupational or home exposures  She has about 15 kg weight loss over 3 months which she attributes to her recent fall and vertebral compression fracture, no loss of appetite.  Denies hemoptysis.  Significant Hospital Events:   3/3-admit with pneumothorax, chest tube placement on the left  Consults:   Cardiothoracic surgery, PCCM  Procedures:    Significant Diagnostic Tests:   CTA 02/12/2021-bilateral airspace disease with dense consolidation, cavitary lesions of the left, dense airspace consolidation of the lower lobe, multiple small pulmonary nodules.  Micro Data:    Antimicrobials:    Interim History / Subjective:   No acute events overnight. She is ready for bronchoscopy. All questions answered.  Objective   Blood pressure 121/79, pulse 80, temperature 98.4 F (36.9 C), temperature source Oral, resp. rate 18, height _0  (1.676 m), weight 45 kg, SpO2 99 %.        Intake/Output Summary (Last 24  hours) at 02/16/2021 1203 Last data filed at 02/15/2021 2137 Gross per 24 hour  Intake 480 ml  Output 950 ml  Net -470 ml   Filed Weights   02/14/21 0306 02/15/21 0022 02/16/21 0000  Weight: 44.8 kg 44.6 kg 45 kg    Examination: Blood pressure 129/73, pulse 99, temperature (!) 101.4 F (38.6 C), temperature source Oral, resp. rate 19, height _1  (1.676 m), weight 44.7 kg, SpO2 97 %. Gen:      No acute distress, thin appearing HEENT:  EOMI, sclera anicteric Neck:     No masses; no thyromegaly Lungs:    Diminished breath sounds on the left with crackles. No wheezing or rhonchi. CV:         Regular rate and rhythm; no murmurs Abd:      + bowel sounds; soft, non-tender; no palpable masses, no distension Ext:    No edema; adequate peripheral perfusion Skin:      Warm and dry; no rash Neuro: alert and oriented x 3  Resolved Hospital Problem list     Assessment & Plan:  Left lung consolidation, cavitation with bilateral airspace disease. Presentation is concerning for tuberculosis.  Differential diagnosis includes fungal infection, malignancy given her smoking history Agree with airborne isolation Unable to collect sputum AFB x3, so will proceed with flexible bronchoscopy today for BAL and brushings.   Left pneumothorax s/p chest tube Chest tube in place to suction Follow daily chest x-ray  Best practice (evaluated daily)  Per primary  Labs   CBC: Recent Labs  Lab 02/12/21 1336 02/14/21 0444 02/15/21 0327 02/16/21 0459  WBC 9.3 16.4* 13.5*  10.6*  NEUTROABS 6.8  --   --   --   HGB 12.4 10.9* 10.7* 11.1*  HCT 38.8 31.6* 33.0* 34.4*  MCV 83.4 78.6* 80.5 80.0  PLT 578* 422* 443* 459*    Basic Metabolic Panel: Recent Labs  Lab 02/12/21 1336 02/14/21 0444 02/15/21 0327 02/16/21 0459  NA 133* 125* 128* 129*  K 4.3 3.6 3.7 3.8  CL 97* 91* 91* 92*  CO2 _0 GLUCOSE 243* 176* 137* 233*  BUN _1 CREATININE 0.50 0.41* <0.30* 0.31*  CALCIUM 9.2 8.3* 8.4*  8.5*   GFR: Estimated Creatinine Clearance: 51.8 mL/min (A) (by C-G formula based on SCr of 0.31 mg/dL (L)). Recent Labs  Lab 02/12/21 1336 02/14/21 0444 02/15/21 0327 02/16/21 0459  WBC 9.3 16.4* 13.5* 10.6*    Liver Function Tests: No results for input(s): AST, ALT, ALKPHOS, BILITOT, PROT, ALBUMIN in the last 168 hours. No results for input(s): LIPASE, AMYLASE in the last 168 hours. No results for input(s): AMMONIA in the last 168 hours.  ABG No results found for: PHART, PCO2ART, PO2ART, HCO3, TCO2, ACIDBASEDEF, O2SAT   Coagulation Profile: No results for input(s): INR, PROTIME in the last 168 hours.  Cardiac Enzymes: No results for input(s): CKTOTAL, CKMB, CKMBINDEX, TROPONINI in the last 168 hours.  HbA1C: Hgb A1c MFr Bld  Date/Time Value Ref Range Status  02/13/2021 03:57 AM 10.8 (H) 4.8 - 5.6 % Final    Comment:    (NOTE) Pre diabetes:          5.7%-6.4%  Diabetes:              >6.4%  Glycemic control for   <7.0% adults with diabetes   03/07/2020 11:03 AM 13.7 (H) 4.6 - 6.5 % Final    Comment:    Glycemic Control Guidelines for People with Diabetes:Non Diabetic:  <6%Goal of Therapy: <7%Additional Action Suggested:  >8%     CBG: Recent Labs  Lab 02/15/21 1151 02/15/21 1717 02/15/21 2206 02/16/21 0559 02/16/21 1130  GLUCAP 336* 311* 249* 212* 260*    Signature:   Freda Jackson, MD [facility] Office: [phone]  If no response to pager , please call [phone] until 7pm After 7:00 pm call Elink  [phone] 02/16/2021, 12:03 PM

## 2021-02-16 NOTE — Progress Notes (Signed)
PROGRESS NOTE    [name] [name]  HBZ:[id number] DOB: [birth year] DOA: 02/12/2021 PCP: Truett Perna, MD   Brief Narrative:  HPI On 02/12/2021 by Dr. Fernande Bras is a 65 y.o. female with a PMH significant for type 2 diabetes, recent vertebral fracture, tobacco use, hyperlipidemia, hypothyroidism.  [name] initially saw her PCP last week for shortness of breath and was scheduled for a chest x-ray which took place earlier today.  She presented to the emergency room from the imaging center via ambulance due to the pneumothorax seen on chest x-ray. In the ED, it was found that they had approximately 90% left tension hydropneumothorax with severe compression and atelectasis of the lung medially and marked mediastinal shift to the right, normal oxygen saturation on room air and not in respiratory distress. They were treated with left chest tube and 2 L oxygen via nasal cannula.  Her vital signs remained stable and interval chest x-ray showed decreased shift. Patient was admitted to medicine service for further workup and management of chronic medical conditions as outlined in detail below.  Additionally, patient endorses 1/2 pack/day for 30-year smoking history.  She stopped smoking 2 years ago.  Denies having any mammograms or colonoscopies and declines those cancer screenings at this time.  Patient has had recent unintentional weight loss and she attributes this to her back pain from recent fall.  Patient was treated approximately 3 weeks ago for vertebral compression fracture after fall in the bathroom.  At baseline, patient can ambulate and perform ADLs without difficulty.  Interim history Admitted for pneumothorax. CT chest obtained showing cavitary lesion. Patient with chest tube in place and cardiothoracic surgery following.  Have consulted pulmonology for bronchoscopy today.  Assessment & Plan   Dyspnea secondary to spontaneous left tension pneumothorax -Status post left  pigtail- will be placed to water seal today -CT surgery consulted and appreciated -High concern for malignancy or infection  Possible cavitary lesion  -CTA chest showed bilateral areas of airspace disease, dense consolidation and thick walled cavities, findings favor atypical infection or fungal pneumonia or TB.  Cavitating malignancy cannot be excluded.  Multiple subcentimeter solid nodules in the right upper lobe, measuring 9 mm  -Chest x-ray today showed large left apex cavitary lesion as well as bilateral infiltrates worse in the left base than the right -Pulmonology consulted and appreciated-planning bronchoscopy today 02/16/2021 -Placed on airborne precautions -Respiratory culture showing few gram-positive cocci, rare Gram variable rods-normal respiratory flora, no staph aureus or Pseudomonas seen  SIRS -Patient with leukocytosis of 16.4, tachycardia as well as tachypnea on 02/14/2021 -Leukocytosis has not resolved, patient continues to have mild tachycardia -Possibly related to the above -UA unremarkable for infection. Urine culture showed <10K of insignificant growth -Chest x-ray as above -Blood cultures no growth to date -Will hold off on starting antibiotics at this time  Diabetes mellitus, type II, poorly controlled -Hemoglobin A1c 10.8 -Home medications Amaryl and Metformin were held -Patient continues to refuse insulin sliding scale -We will continue CBG monitoring -Have had a long conversation with patient and explained the reasoning for being on insulin however she refuses -Continue amaryl   Hypothyroidism -Continue Synthroid  Thoracic vertebral compression fracture -Present on admission -Continue nasal calcitonin, PRN tramadol and morphine, lidocaine patch -PT and OT recommending home health  Mild hyponatremia -Patient asymptomatic -Sodium currently 129, will continue to monitor BMP  Malnutrition/underweight -BMI 15.95  -Nutrition consulted  DVT Prophylaxis   SCDs  Code Status: Full  Family Communication: None at bedside  Disposition Plan:  Status is: Inpatient  Remains inpatient appropriate because:Inpatient level of care appropriate due to severity of illness   Dispo: The patient is from: Home              Anticipated d/c is to: Home              Patient currently is not medically stable to d/c.   Difficult to place patient No   Consultants Cardiothoracic surgery PCCM  Procedures  Pigtail placement  Antibiotics   Anti-infectives (From admission, onward)   None      Subjective:   [name] [name] seen and examined today.  Patient has cough but states it is nonproductive.  Feels her shortness of breath has improved.  Denies current chest pain, abdominal pain, nausea or vomiting, diarrhea or constipation, dizziness or headache.    Objective:   Vitals:   02/15/21 2129 02/15/21 2219 02/16/21 0000 02/16/21 0434  BP: 124/72 132/79 125/79 122/71  Pulse: 83 88 86 (!) 105  Resp: Temp: 98.9 F (37.2 C) 98.8 F (37.1 C) 99.6 F (37.6 C) 99.3 F (37.4 C)  TempSrc: Oral Oral Oral Oral  SpO2: 98% 98% 98% 94%  Weight:   45 kg   Height:        Intake/Output Summary (Last 24 hours) at 02/16/2021 1034 Last data filed at 02/15/2021 2137 Gross per 24 hour  Intake 480 ml  Output 1500 ml  Net -1020 ml   Filed Weights   02/14/21 0306 02/15/21 0022 02/16/21 0000  Weight: 44.8 kg 44.6 kg 45 kg   Exam  General: Well developed, frail, NAD  HEENT: NCAT, mucous membranes moist.   Cardiovascular: S1 S2 auscultated, RRR, no murmur  Respiratory: Diminished breath sounds however improving on the left.  Chest tube in place  Abdomen: Soft, nontender, nondistended, + bowel sounds  Extremities: warm dry without cyanosis clubbing or edema  Neuro: AAOx3, nonfocal  Skin: Without rashes exudates or nodules  Psych: pleasant, appropriate mood and affect  Data Reviewed: I have personally reviewed following labs and  imaging studies  CBC: Recent Labs  Lab 02/12/21 1336 02/14/21 0444 02/15/21 0327 02/16/21 0459  WBC 9.3 16.4* 13.5* 10.6*  NEUTROABS 6.8  --   --   --   HGB 12.4 10.9* 10.7* 11.1*  HCT 38.8 31.6* 33.0* 34.4*  MCV 83.4 78.6* 80.5 80.0  PLT 578* 422* 443* 459*   Basic Metabolic Panel: Recent Labs  Lab 02/12/21 1336 02/14/21 0444 02/15/21 0327 02/16/21 0459  NA 133* 125* 128* 129*  K 4.3 3.6 3.7 3.8  CL 97* 91* 91* 92*  CO2 GLUCOSE 243* 176* 137* 233*  BUN CREATININE 0.50 0.41* <0.30* 0.31*  CALCIUM 9.2 8.3* 8.4* 8.5*   GFR: Estimated Creatinine Clearance: 51.8 mL/min (A) (by C-G formula based on SCr of 0.31 mg/dL (L)). Liver Function Tests: No results for input(s): AST, ALT, ALKPHOS, BILITOT, PROT, ALBUMIN in the last 168 hours. No results for input(s): LIPASE, AMYLASE in the last 168 hours. No results for input(s): AMMONIA in the last 168 hours. Coagulation Profile: No results for input(s): INR, PROTIME in the last 168 hours. Cardiac Enzymes: No results for input(s): CKTOTAL, CKMB, CKMBINDEX, TROPONINI in the last 168 hours. BNP (last 3 results) No results for input(s): PROBNP in the last 8760 hours. HbA1C: No results for input(s): HGBA1C in the last 72 hours. CBG: Recent Labs  Lab 02/15/21  [id number] 02/15/21 1151 02/15/21 1717 02/15/21 2206 02/16/21 0559  GLUCAP 163* 336* 311* 249* 212*   Lipid Profile: No results for input(s): CHOL, HDL, LDLCALC, TRIG, CHOLHDL, LDLDIRECT in the last 72 hours. Thyroid Function Tests: No results for input(s): TSH, T4TOTAL, FREET4, T3FREE, THYROIDAB in the last 72 hours. Anemia Panel: No results for input(s): VITAMINB12, FOLATE, FERRITIN, TIBC, IRON, RETICCTPCT in the last 72 hours. Urine analysis:    Component Value Date/Time   COLORURINE YELLOW 02/13/2021 1900   APPEARANCEUR CLEAR 02/13/2021 1900   LABSPEC 1.013 02/13/2021 1900   PHURINE 6.0 02/13/2021 1900   GLUCOSEU 150 (A) 02/13/2021 1900    HGBUR SMALL (A) 02/13/2021 1900   HGBUR negative 01/19/2008 0750   BILIRUBINUR NEGATIVE 02/13/2021 1900   KETONESUR NEGATIVE 02/13/2021 1900   PROTEINUR NEGATIVE 02/13/2021 1900   UROBILINOGEN 0.2 01/19/2008 0750   NITRITE NEGATIVE 02/13/2021 1900   LEUKOCYTESUR NEGATIVE 02/13/2021 1900   Sepsis Labs: @LABRCNTIP (procalcitonin:4,lacticidven:4)  ) Recent Results (from the past 240 hour(s))  Resp Panel by RT-PCR (Flu A&B, Covid) Nasopharyngeal Swab     Status: None   Collection Time: 02/12/21  3:44 PM   Specimen: Nasopharyngeal Swab; Nasopharyngeal(NP) swabs in vial transport medium  Result Value Ref Range Status   SARS Coronavirus 2 by RT PCR NEGATIVE NEGATIVE Final    Comment: (NOTE) SARS-CoV-2 target nucleic acids are NOT DETECTED.  The SARS-CoV-2 RNA is generally detectable in upper respiratory specimens during the acute phase of infection. The lowest concentration of SARS-CoV-2 viral copies this assay can detect is 138 copies/mL. A negative result does not preclude SARS-Cov-2 infection and should not be used as the sole basis for treatment or other patient management decisions. A negative result may occur with  improper specimen collection/handling, submission of specimen other than nasopharyngeal swab, presence of viral mutation(s) within the areas targeted by this assay, and inadequate number of viral copies(<138 copies/mL). A negative result must be combined with clinical observations, patient history, and epidemiological information. The expected result is Negative.  Fact Sheet for Patients:  BloggerCourse.comhttps://www.fda.gov/media/152166/download  Fact Sheet for Healthcare Providers:  SeriousBroker.ithttps://www.fda.gov/media/152162/download  This test is no t yet approved or cleared by the [place] FDA and  has been authorized for detection and/or diagnosis of SARS-CoV-2 by FDA under an Emergency Use Authorization (EUA). This EUA will remain  in effect (meaning this test can be used) for  the duration of the COVID-19 declaration under Section 564(b)(1) of the Act, 21 U.S.C.section 360bbb-3(b)(1), unless the authorization is terminated  or revoked sooner.       Influenza A by PCR NEGATIVE NEGATIVE Final   Influenza B by PCR NEGATIVE NEGATIVE Final    Comment: (NOTE) The Xpert Xpress SARS-CoV-2/FLU/RSV plus assay is intended as an aid in the diagnosis of influenza from Nasopharyngeal swab specimens and should not be used as a sole basis for treatment. Nasal washings and aspirates are unacceptable for Xpert Xpress SARS-CoV-2/FLU/RSV testing.  Fact Sheet for Patients: BloggerCourse.comhttps://www.fda.gov/media/152166/download  Fact Sheet for Healthcare Providers: SeriousBroker.ithttps://www.fda.gov/media/152162/download  This test is not yet approved or cleared by the [place] FDA and has been authorized for detection and/or diagnosis of SARS-CoV-2 by FDA under an Emergency Use Authorization (EUA). This EUA will remain in effect (meaning this test can be used) for the duration of the COVID-19 declaration under Section 564(b)(1) of the Act, 21 U.S.C. section 360bbb-3(b)(1), unless the authorization is terminated or revoked.  Performed at [facility], [address] [place], [place] [id number]   Culture,  blood (routine x 2)     Status: None (Preliminary result)   Collection Time: 02/13/21  2:25 PM   Specimen: BLOOD  Result Value Ref Range Status   Specimen Description BLOOD LEFT ANTECUBITAL  Final   Special Requests   Final    BOTTLES DRAWN AEROBIC AND ANAEROBIC Blood Culture adequate volume   Culture   Final    NO GROWTH 2 DAYS Performed at [facility], [address] [place], [place] [address]    Report Status PENDING  Incomplete  Culture, blood (routine x 2)     Status: None (Preliminary result)   Collection Time: 02/13/21  2:29 PM   Specimen: BLOOD  Result Value Ref Range Status   Specimen Description BLOOD RIGHT ANTECUBITAL  Final   Special Requests   Final     BOTTLES DRAWN AEROBIC AND ANAEROBIC Blood Culture adequate volume   Culture   Final    NO GROWTH 2 DAYS Performed at [facility], [address] [place], [place] [address]    Report Status PENDING  Incomplete  Expectorated Sputum Assessment w Gram Stain, Rflx to Resp Cult     Status: None   Collection Time: 02/13/21  3:13 PM   Specimen: Expectorated Sputum  Result Value Ref Range Status   Specimen Description EXPECTORATED SPUTUM  Final   Special Requests Normal  Final   Sputum evaluation   Final    THIS SPECIMEN IS ACCEPTABLE FOR SPUTUM CULTURE Performed at [facility], [address], [place], [place] [address]    Report Status 02/14/2021 FINAL  Final  Culture, Respiratory w Gram Stain     Status: None   Collection Time: 02/13/21  3:13 PM  Result Value Ref Range Status   Specimen Description EXPECTORATED SPUTUM  Final   Special Requests Normal Reflexed from [id number]  Final   Gram Stain   Final    MODERATE WBC PRESENT,BOTH PMN AND MONONUCLEAR FEW GRAM POSITIVE COCCI RARE GRAM VARIABLE ROD    Culture   Final    MODERATE Normal respiratory flora-no Staph aureus or Pseudomonas seen Performed at [facility], [address], [place], [place] [address]    Report Status 02/16/2021 FINAL  Final  Culture, Urine     Status: Abnormal   Collection Time: 02/13/21 10:03 PM   Specimen: Urine, Clean Catch  Result Value Ref Range Status   Specimen Description URINE, CLEAN CATCH  Final   Special Requests NONE  Final   Culture (A)  Final    <10,000 COLONIES/mL INSIGNIFICANT GROWTH Performed at [facility], [address] [place], [place] [address]    Report Status 02/15/2021 FINAL  Final      Radiology Studies: DG Chest Port 1 View  Result Date: 02/16/2021 CLINICAL DATA:  Chest tube TB? EXAM: PORTABLE CHEST 1 VIEW COMPARISON:  02/15/2021 FINDINGS: Left chest tube unchanged in position. Left upper lobe cavitary mass again seen. Focal airspace opacity again  noted within the right upper lobe. Cardiac silhouette obscured by left lung base opacity. IMPRESSION: No significant interval change in appearance of the chest left upper lobe cavitary mass and right upper and left lower lobe airspace opacity again seen. Electronically Signed   By: Acquanetta Belling M.D.   On: 02/16/2021 08:00   DG CHEST PORT 1 VIEW  Result Date: 02/15/2021 CLINICAL DATA:  Evaluate chest tube. EXAM: PORTABLE CHEST 1 VIEW COMPARISON:  February 14, 2021 FINDINGS: The known cavitary mass  on the left is stable. A left chest tube remains in place. Dense opacity remains in the left mid and lower lung. Prominent skin folds are seen on the right. There appear to be lung markings on both sides of the skin folds without convincing evidence of pneumothorax. Patchy opacity throughout the right lung is stable. No other changes. IMPRESSION: 1. No change in left chest tube, dense left basilar infiltrate, or known cavitary lesion in the left apex. 2. No change in patchy right-sided pulmonary opacities. 3. Prominent skin folds on the right somewhat limiting evaluation but there is no convincing evidence of pneumothorax. Electronically Signed   By: Gerome Sam III M.D   On: 02/15/2021 08:47     Scheduled Meds: . atorvastatin  10 mg Oral QPM  . calcitonin (salmon)  1 spray Alternating Nares Daily  . cholecalciferol  1,000 Units Oral Daily  . glimepiride  1 mg Oral Q breakfast  . insulin aspart  0-5 Units Subcutaneous QHS  . insulin aspart  0-9 Units Subcutaneous TID WC  . levothyroxine  50 mcg Oral QAC breakfast  . lidocaine  1 patch Transdermal Q24H   Continuous Infusions:   LOS: 4 days   Time Spent in minutes   30 minutes  Anshul Meddings D.O. on 02/16/2021 at 10:34 AM  Between 7am to 7pm - Please see pager noted on [email or web address]  After 7pm go to [email or web address]  And look for the night coverage person covering for me after hours  [facility] Office  [phone]

## 2021-02-16 NOTE — Progress Notes (Signed)
Physical Therapy Treatment Patient Details Name: [name] [name] MRN: [id number] DOB: 10-06-1956 Today's Date: 02/16/2021    History of Present Illness 65 y.o. female with admitted from PCP office on 3/3 with shortness of breath, CXR showing pneumothorax, s/p chest tube placement on 3/3. Workup for TB, vs atypical infection, vs malignancy, plan for bronchoscopy on 3/7. Pt with recent fall, compression fracture of T12 (unsure if from fall or not). PMH significant for type 2 diabetes, recent vertebral fracture, tobacco use, hyperlipidemia, hypothyroidism.    PT Comments    Pt in supine resting upon PT arrival to room, and reports hunger which is limiting ability to mobilize OOB. Pt agreeable to bed-level exercises only. Pt performed LE strengthening exercises to tolerance, to address LE weakness. Pt tolerated well, but has limited overall activity tolerance at this time. PT to continue to follow acutely.    Follow Up Recommendations  Home health PT;Other (comment);Supervision for mobility/OOB (vs no follow up pending pt progress)     Equipment Recommendations  Rolling walker with 5" wheels    Recommendations for Other Services       Precautions / Restrictions Precautions Precautions: Fall Precaution Comments: Chest tube to wall suction, can be to water seal for mobility. On airborne precautions Restrictions Weight Bearing Restrictions: No    Mobility  Bed Mobility               General bed mobility comments: exercise only, per pt request    Transfers                    Ambulation/Gait                 Stairs             Wheelchair Mobility    Modified Rankin (Stroke Patients Only)       Balance Overall balance assessment: Needs assistance Sitting-balance support: Feet supported Sitting balance-Leahy Scale: Good     Standing balance support: Single extremity supported Standing balance-Leahy Scale: Fair                 High  Level Balance Comments: light hand held assist            Cognition Arousal/Alertness: Awake/alert Behavior During Therapy: WFL for tasks assessed/performed Overall Cognitive Status: Within Functional Limits for tasks assessed                                        Exercises General Exercises - Lower Extremity Ankle Circles/Pumps: AROM;Both;20 reps;Supine Quad Sets: AROM;Both;20 reps;Supine Hip ABduction/ADduction: AROM;Both;20 reps;Supine (hip adduction against min PT resistance) Hip Flexion/Marching: AROM;Both;5 reps;Supine    General Comments        Pertinent Vitals/Pain Pain Assessment: Faces Faces Pain Scale: No hurt Pain Intervention(s): Monitored during session    Home Living                      Prior Function            PT Goals (current goals can now be found in the care plan section) Acute Rehab PT Goals Patient Stated Goal: home PT Goal Formulation: With patient Time For Goal Achievement: 02/27/21 Potential to Achieve Goals: Good Progress towards PT goals: Progressing toward goals    Frequency    Min 3X/week      PT Plan Current plan remains appropriate  Co-evaluation              AM-PAC PT "6 Clicks" Mobility   Outcome Measure  Help needed turning from your back to your side while in a flat bed without using bedrails?: A Little Help needed moving from lying on your back to sitting on the side of a flat bed without using bedrails?: A Little Help needed moving to and from a bed to a chair (including a wheelchair)?: A Little Help needed standing up from a chair using your arms (e.g., wheelchair or bedside chair)?: A Little Help needed to walk in hospital room?: A Little Help needed climbing 3-5 steps with a railing? : A Little 6 Click Score: 18    End of Session Equipment Utilized During Treatment: Oxygen Activity Tolerance: Patient tolerated treatment well;Patient limited by fatigue Patient left: in  bed;with call bell/phone within reach;with bed alarm set;with SCD's reapplied Nurse Communication: Mobility status PT Visit Diagnosis: Other abnormalities of gait and mobility (R26.89);Difficulty in walking, not elsewhere classified (R26.2);Pain Pain - Right/Left: Left Pain - part of body:  (chest, back)     Time: 0258-5277 PT Time Calculation (min) (ACUTE ONLY): 14 min  Charges:  $Therapeutic Exercise: 8-22 mins                    Marye Round, PT Acute Rehabilitation Services Pager [phone]  Office [phone]   Tyrone Apple E Christain [place] 02/16/2021, 9:25 AM

## 2021-02-16 NOTE — H&P (View-Only) (Signed)
NAMEAubri [name], MRN:  [id number], DOB:  Dec 31, 1955, LOS: 4 ADMISSION DATE:  02/12/2021, CONSULTATION DATE:  02/13/2021 REFERRING MD:  Curly Shores MD, CHIEF COMPLAINT: Abnormal CT scan, rule out TB  Brief History:   65 year old [name] [name] with diabetes, vertebral fracture, tobacco use.  Presenting with large left hydropneumothorax s/p chest tube placement in the ED. CT shows airspace consolidation, cavitary lesions left greater than right.  She is febrile today prompting placement in airborne isolation due to concern for TB and PCCM consultation for possible bronchoscopy Complains of chronic cough, occasional sputum production.  Past Medical History:   Diabetes, hyperlipidemia, thyroid disease  Social history notable for 15-pack-year smoker.  Quit in 2020.  She denies exposure to TB, no being homeless or incarcerated. She is an immigrant from [place] over 20 years ago but still travels back every year.  Used to work as a Corporate treasurer.  Denies any significant occupational or home exposures  She has about 15 kg weight loss over 3 months which she attributes to her recent fall and vertebral compression fracture, no loss of appetite.  Denies hemoptysis.  Significant Hospital Events:   3/3-admit with pneumothorax, chest tube placement on the left  Consults:   Cardiothoracic surgery, PCCM  Procedures:    Significant Diagnostic Tests:   CTA 02/12/2021-bilateral airspace disease with dense consolidation, cavitary lesions of the left, dense airspace consolidation of the lower lobe, multiple small pulmonary nodules.  Micro Data:    Antimicrobials:    Interim History / Subjective:   No acute events overnight. She is ready for bronchoscopy. All questions answered.  Objective   Blood pressure 121/79, pulse 80, temperature 98.4 F (36.9 C), temperature source Oral, resp. rate 18, height _0  (1.676 m), weight 45 kg, SpO2 99 %.        Intake/Output Summary (Last 24  hours) at 02/16/2021 1203 Last data filed at 02/15/2021 2137 Gross per 24 hour  Intake 480 ml  Output 950 ml  Net -470 ml   Filed Weights   02/14/21 0306 02/15/21 0022 02/16/21 0000  Weight: 44.8 kg 44.6 kg 45 kg    Examination: Blood pressure 129/73, pulse 99, temperature (!) 101.4 F (38.6 C), temperature source Oral, resp. rate 19, height _1  (1.676 m), weight 44.7 kg, SpO2 97 %. Gen:      No acute distress, thin appearing HEENT:  EOMI, sclera anicteric Neck:     No masses; no thyromegaly Lungs:    Diminished breath sounds on the left with crackles. No wheezing or rhonchi. CV:         Regular rate and rhythm; no murmurs Abd:      + bowel sounds; soft, non-tender; no palpable masses, no distension Ext:    No edema; adequate peripheral perfusion Skin:      Warm and dry; no rash Neuro: alert and oriented x 3  Resolved Hospital Problem list     Assessment & Plan:  Left lung consolidation, cavitation with bilateral airspace disease. Presentation is concerning for tuberculosis.  Differential diagnosis includes fungal infection, malignancy given her smoking history Agree with airborne isolation Unable to collect sputum AFB x3, so will proceed with flexible bronchoscopy today for BAL and brushings.   Left pneumothorax s/p chest tube Chest tube in place to suction Follow daily chest x-ray  Best practice (evaluated daily)  Per primary  Labs   CBC: Recent Labs  Lab 02/12/21 1336 02/14/21 0444 02/15/21 0327 02/16/21 0459  WBC 9.3 16.4* 13.5*  10.6*  NEUTROABS 6.8  --   --   --   HGB 12.4 10.9* 10.7* 11.1*  HCT 38.8 31.6* 33.0* 34.4*  MCV 83.4 78.6* 80.5 80.0  PLT 578* 422* 443* 459*    Basic Metabolic Panel: Recent Labs  Lab 02/12/21 1336 02/14/21 0444 02/15/21 0327 02/16/21 0459  NA 133* 125* 128* 129*  K 4.3 3.6 3.7 3.8  CL 97* 91* 91* 92*  CO2 _0 GLUCOSE 243* 176* 137* 233*  BUN _1 CREATININE 0.50 0.41* <0.30* 0.31*  CALCIUM 9.2 8.3* 8.4*  8.5*   GFR: Estimated Creatinine Clearance: 51.8 mL/min (A) (by C-G formula based on SCr of 0.31 mg/dL (L)). Recent Labs  Lab 02/12/21 1336 02/14/21 0444 02/15/21 0327 02/16/21 0459  WBC 9.3 16.4* 13.5* 10.6*    Liver Function Tests: No results for input(s): AST, ALT, ALKPHOS, BILITOT, PROT, ALBUMIN in the last 168 hours. No results for input(s): LIPASE, AMYLASE in the last 168 hours. No results for input(s): AMMONIA in the last 168 hours.  ABG No results found for: PHART, PCO2ART, PO2ART, HCO3, TCO2, ACIDBASEDEF, O2SAT   Coagulation Profile: No results for input(s): INR, PROTIME in the last 168 hours.  Cardiac Enzymes: No results for input(s): CKTOTAL, CKMB, CKMBINDEX, TROPONINI in the last 168 hours.  HbA1C: Hgb A1c MFr Bld  Date/Time Value Ref Range Status  02/13/2021 03:57 AM 10.8 (H) 4.8 - 5.6 % Final    Comment:    (NOTE) Pre diabetes:          5.7%-6.4%  Diabetes:              >6.4%  Glycemic control for   <7.0% adults with diabetes   03/07/2020 11:03 AM 13.7 (H) 4.6 - 6.5 % Final    Comment:    Glycemic Control Guidelines for People with Diabetes:Non Diabetic:  <6%Goal of Therapy: <7%Additional Action Suggested:  >8%     CBG: Recent Labs  Lab 02/15/21 1151 02/15/21 1717 02/15/21 2206 02/16/21 0559 02/16/21 1130  GLUCAP 336* 311* 249* 212* 260*    Signature:   Freda Jackson, MD [facility] Office: [phone]  If no response to pager , please call [phone] until 7pm After 7:00 pm call Elink  [phone] 02/16/2021, 12:03 PM

## 2021-02-16 NOTE — Progress Notes (Signed)
Initial Nutrition Assessment  DOCUMENTATION CODES:   Non-severe (moderate) malnutrition in context of chronic illness,Underweight  INTERVENTION:   -MVI with minerals daily -[place] yogurt with meals -Liberalize diet to regular  NUTRITION DIAGNOSIS:   Moderate Malnutrition related to chronic illness (DM) as evidenced by energy intake < or equal to 75% for > or equal to 1 month,mild fat depletion,moderate fat depletion,mild muscle depletion,moderate muscle depletion.  GOAL:   Patient will meet greater than or equal to 90% of their needs  MONITOR:   PO intake,Supplement acceptance,Labs,Weight trends,Skin,I & O's  REASON FOR ASSESSMENT:   Consult Assessment of nutrition requirement/status  ASSESSMENT:   [name] [name] is a 65 y.o. female with a PMH significant for type 2 diabetes, recent vertebral fracture, tobacco use, hyperlipidemia, hypothyroidism.  Pt admitted with spontaneous lt tension pneumothorax.   3/3- chest tube placed 3/7- s/p Flexible bronchoscopy with bronchial alveolar lavage (93810) and Transbronchial lung biopsy, single lobe  Reviewed I/O's: -1 L x 24 hours and -3.3 L since admission  UOP: 1.5 L x 24 hours  Chest tube output: 0 ml x 24 hours  Spoke with pt at bedside, who is anxious to eat after her procedure, stating she is hungry. Pt reports a general decline in health over the past 3 weeks. Pt experienced a fall during this time and diet consisted most of water and juice. Per pt, she has been eating minimally off meal trays since hospitalization as she does not like the hospital food, especially the meat. Pt husband has been bringing her in snacks throughout hospitalization.  Per pt, UBW is around 130#. She estimates she has lost 33# over the past 3 months. Reviewed wt hx; pt has experienced a 26% wt loss over the past year, which is significant for time frame. Suspect some wt loss may also be related to uncontrolled DM (per DM coordinator notes, pt is  refusing insulin).   Discussed importance of good nutritional intake to promote healing. Pt refusing all nutritional supplements, reporting she only consumes "natural foods". Reviewed menu and discussed ways to increase protein in her diet. Also encouraged outside food from husband. RD discussed with Dr Ree Kida, who was agreeable to liberalizing diet to regular.   Medications reviewed and include vitamin D3.   Lab Results  Component Value Date   HGBA1C 10.8 (H) 02/13/2021   PTA DM medications are 2 mg amaryl daily.   Labs reviewed: Na: 129, CBGS: 212-260 (inpatient orders for glycemic control are 0-5 units insulin aspart daily at bedtime, 0-9 units insulin aspart TID with meals, and 1 mg amaryl daily).   NUTRITION - FOCUSED PHYSICAL EXAM:  Flowsheet Row Most Recent Value  Orbital Region Mild depletion  Upper Arm Region Moderate depletion  Thoracic and Lumbar Region Mild depletion  Buccal Region Mild depletion  Temple Region Moderate depletion  Clavicle Bone Region Moderate depletion  Clavicle and Acromion Bone Region Moderate depletion  Scapular Bone Region Moderate depletion  Dorsal Hand Mild depletion  Patellar Region Moderate depletion  Anterior Thigh Region Moderate depletion  Posterior Calf Region Moderate depletion  Edema (RD Assessment) None  Hair Reviewed  Eyes Reviewed  Mouth Reviewed  Skin Reviewed  Nails Reviewed       Diet Order:   Diet Order            Diet regular Room service appropriate? Yes; Fluid consistency: Thin  Diet effective now                 EDUCATION NEEDS:  Education needs have been addressed  Skin:  Skin Assessment: Reviewed RN Assessment  Last BM:  02/12/21  Height:   Ht Readings from Last 1 Encounters:  02/12/21 _0  (1.676 m)    Weight:   Wt Readings from Last 1 Encounters:  02/16/21 45 kg    Ideal Body Weight:  59.1 kg  BMI:  Body mass index is 16 kg/m.  Estimated Nutritional Needs:   Kcal:   1800-2000  Protein:  100-115 grams  Fluid:  > 1.8 L    [name], RD, LDN, [place] Registered Dietitian II Certified Diabetes Care and Education Specialist Please refer to [facility] for RD and/or RD on-call/weekend/after hours pager

## 2021-02-16 NOTE — Interval H&P Note (Signed)
History and Physical Interval Note:  02/16/2021 12:07 PM  [name] [name]  has presented today for surgery, with the diagnosis of rule out TB.  The various methods of treatment have been discussed with the patient and family. After consideration of risks, benefits and other options for treatment, the patient has consented to  Procedure(s): VIDEO BRONCHOSCOPY WITH FLUORO (Left) as a surgical intervention.  The patient's history has been reviewed, patient examined, no change in status, stable for surgery.  I have reviewed the patient's chart and labs.  Questions were answered to the patient's satisfaction.     Martina Sinner

## 2021-02-16 NOTE — Transfer of Care (Signed)
Immediate Anesthesia Transfer of Care Note  Patient: [name] [name]  Procedure(s) Performed: VIDEO BRONCHOSCOPY WITH FLUORO (Left ) BRONCHIAL BIOPSIES BRONCHIAL BRUSHINGS BRONCHIAL WASHINGS  Patient Location: PACU  Anesthesia Type:General  Level of Consciousness: drowsy  Airway & Oxygen Therapy: Patient Spontanous Breathing and Patient connected to nasal cannula oxygen  Post-op Assessment: Report given to RN and Post -op Vital signs reviewed and stable  Post vital signs: Reviewed and stable  Last Vitals:  Vitals Value Taken Time  BP 128/76 02/16/21 1419  Temp 36.4 C 02/16/21 1419  Pulse 84 02/16/21 1419  Resp 17 02/16/21 1419  SpO2 95 % 02/16/21 1419    Last Pain:  Vitals:   02/16/21 1419  TempSrc: Axillary  PainSc:       Patients Stated Pain Goal: 2 (02/12/21 2020)  Complications: No complications documented.

## 2021-02-17 DIAGNOSIS — M546 Pain in thoracic spine: Secondary | ICD-10-CM

## 2021-02-17 DIAGNOSIS — Z978 Presence of other specified devices: Secondary | ICD-10-CM

## 2021-02-17 DIAGNOSIS — J939 Pneumothorax, unspecified: Secondary | ICD-10-CM | POA: Diagnosis not present

## 2021-02-17 DIAGNOSIS — J181 Lobar pneumonia, unspecified organism: Secondary | ICD-10-CM | POA: Diagnosis present

## 2021-02-17 DIAGNOSIS — J159 Unspecified bacterial pneumonia: Secondary | ICD-10-CM

## 2021-02-17 DIAGNOSIS — A419 Sepsis, unspecified organism: Secondary | ICD-10-CM | POA: Diagnosis not present

## 2021-02-17 DIAGNOSIS — R634 Abnormal weight loss: Secondary | ICD-10-CM | POA: Diagnosis not present

## 2021-02-17 DIAGNOSIS — E113512 Type 2 diabetes mellitus with proliferative diabetic retinopathy with macular edema, left eye: Secondary | ICD-10-CM | POA: Diagnosis not present

## 2021-02-17 LAB — ACID FAST SMEAR (AFB, MYCOBACTERIA)
Acid Fast Smear: NEGATIVE
Acid Fast Smear: NEGATIVE

## 2021-02-17 LAB — GLUCOSE, CAPILLARY
Glucose-Capillary: 238 mg/dL — ABNORMAL HIGH (ref 70–99)
Glucose-Capillary: 244 mg/dL — ABNORMAL HIGH (ref 70–99)
Glucose-Capillary: 264 mg/dL — ABNORMAL HIGH (ref 70–99)
Glucose-Capillary: 188 mg/dL — ABNORMAL HIGH (ref 70–99)
Glucose-Capillary: 145 mg/dL — ABNORMAL HIGH (ref 70–99)

## 2021-02-17 LAB — CBC
HCT: 35.3 % — ABNORMAL LOW (ref 36.0–46.0)
Hemoglobin: 11.3 g/dL — ABNORMAL LOW (ref 12.0–15.0)
MCH: 26 pg (ref 26.0–34.0)
MCHC: 32 g/dL (ref 30.0–36.0)
MCV: 81.1 fL (ref 80.0–100.0)
Platelets: 457 10*3/uL — ABNORMAL HIGH (ref 150–400)
RBC: 4.35 MIL/uL (ref 3.87–5.11)
RDW: 13.8 % (ref 11.5–15.5)
WBC: 10.7 10*3/uL — ABNORMAL HIGH (ref 4.0–10.5)
nRBC: 0 % (ref 0.0–0.2)

## 2021-02-17 LAB — BASIC METABOLIC PANEL
Anion gap: 10 (ref 5–15)
BUN: 6 mg/dL — ABNORMAL LOW (ref 8–23)
CO2: 28 mmol/L (ref 22–32)
Calcium: 8.7 mg/dL — ABNORMAL LOW (ref 8.9–10.3)
Chloride: 92 mmol/L — ABNORMAL LOW (ref 98–111)
Creatinine, Ser: 0.39 mg/dL — ABNORMAL LOW (ref 0.44–1.00)
GFR, Estimated: >60 mL/min (ref >60)
Glucose, Bld: 241 mg/dL — ABNORMAL HIGH (ref 70–99)
Potassium: 4.5 mmol/L (ref 3.5–5.1)
Sodium: 130 mmol/L — ABNORMAL LOW (ref 135–145)

## 2021-02-17 LAB — CRYPTOCOCCAL ANTIGEN: Crypto Ag: NEGATIVE

## 2021-02-17 MED ORDER — GLIMEPIRIDE 1 MG PO TABS
1.0000 mg | ORAL_TABLET | Freq: Once | ORAL | Status: AC
  Administered 2021-02-17: 1 mg via ORAL
  Filled 2021-02-17: qty 1

## 2021-02-17 MED ORDER — GLIMEPIRIDE 2 MG PO TABS
2.0000 mg | ORAL_TABLET | Freq: Every day | ORAL | Status: DC
  Administered 2021-02-18: 2 mg via ORAL
  Filled 2021-02-17: qty 1

## 2021-02-17 MED ORDER — AMOXICILLIN-POT CLAVULANATE 875-125 MG PO TABS
1.0000 | ORAL_TABLET | Freq: Two times a day (BID) | ORAL | Status: DC
  Administered 2021-02-17 – 2021-02-25 (×17): 1 via ORAL
  Filled 2021-02-17 (×17): qty 1

## 2021-02-17 NOTE — Consult Note (Signed)
[facility]    Date of Admission:  02/12/2021     Reason for Consult: fever, chest cavitary lesion on ct    Referring Provider: Ree Kida    Lines:  Peripheral iv's  Abx: 3/08-c amoxicillin-clavulonate        Assessment: Sepsis Upper lobe Cavitary chest lesion left side; bilateral airspace opacity L>>R Left pneumothorax s/p tube thoracostomy 3/03 Thoracic mid back pain  65 yo female [place] smoker, immigrant admitted with 3 weeks at least progressive cough, sob, found to have fever and chest ct left cavitary lesions/bilateral air-space opacity   ddx agree tb/endemic fungi vs malignancy. 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 If tb potentially could be pots. The chest ct (albeit poor resolution) doesn't visualize any osseous pyogenic concern so far  Plan: 1. Agree with tb w/u  2. Will send fungal serology and quantiferon gold as well 3. Will also send MTB/rifampin pcr -- if negative x2 and sx improving with regular antibiotics coverage, potentially could take off airborne isolation 4. F/u cytology/LUL biopsy 5. Start amox-clav  Principal Problem:   Pneumothorax on left Active Problems:   Type II diabetes mellitus with proliferative retinopathy (HCC)   Compression fracture of T12 vertebra (HCC)   Weight loss   Pneumothorax   Malnutrition of moderate degree   Scheduled Meds: . amoxicillin-clavulanate  1 tablet Oral Q12H  . atorvastatin  10 mg Oral QPM  . calcitonin  (salmon)  1 spray Alternating Nares Daily  . cholecalciferol  1,000 Units Oral Daily  . glimepiride  1 mg Oral Once  . [START ON 02/18/2021] glimepiride  2 mg Oral Q breakfast  . insulin aspart  0-5 Units Subcutaneous QHS  . insulin aspart  0-9 Units Subcutaneous TID WC  . levothyroxine  50 mcg Oral QAC breakfast  . lidocaine  1 patch Transdermal Q24H   Continuous Infusions: PRN Meds:.acetaminophen **OR** acetaminophen, morphine injection, traMADol  HPI: Phenix Grein is a 65 y.o. female immigrant from [place], smoker, dm2, here with progressive sob, intermittent fever of several weeks, found to have chest cavitary lesions  Patient understand some english but history mostly via chart with supplemental yes/no answer from her  Patient reports 3 weeks of progressive sx including productive cough, sob She also reports same duration thoracic back pain; no numbness/tingling weakness. She reports a ground level mechanical fall before that attributing that to back pain. Chart mentioned she was treated conservatively in [facility] ED 3 weeks ago for compression fx of the thoracic back.  No headache, n/v/diarrhea, rash, abd pain No weight loss  Appetite decent but not baseline No f/c/nightsweat  She had outpatient PCP visit 2 weeks prior to admission; cxr mentioned left pneumothorax so called for admission on 3/03 where a chest tube was placed  She had intermittent fever here Had bronch 3/07 due to inability to give sputum cx although a sample was collected. Afb, fungal, respiratory cx so far ngtd  Her wbc improving. She is stable on 2 liters supplemental oxygen  Review of Systems: ROS Other ros negative  Past Medical History:  Diagnosis Date  . Compression fracture of T12 vertebra ([place]) 01/22/2021  . Pneumothorax on left 02/12/2021  . Weight loss     Social History   Tobacco Use  . Smoking status: Former Smoker    Types: Cigarettes    Quit date: 08/28/2019    Years since  quitting: 1.4  . Smokeless tobacco: Never Used  Substance Use Topics  . Alcohol use: Not Currently  . Drug use: Never    History reviewed. No pertinent family history. No Known Allergies  OBJECTIVE: Blood pressure 133/76, pulse 97, temperature 100.1 F (37.8 C), temperature source Oral, resp. rate (!) 21, height _0  (1.676 m), weight 45.6 kg, SpO2 98 %.  Physical Exam Intermittent dry cough noted, otherwise no distress, conversation limited by language barier Heent: normocephalic; per conj clear eomi Lymph no cervical/inguinal/axillary adneopathy cv rrr no mrg Lungs decreased bs and upper airway sound left lung abd s/nt Ext no edema Skin no rash msk no joint tenderness; midback t8-10 mild tenderness on palpation Neuro cn2-12 intact, strength/reflex intact Psych alert/oriented  Lab Results Lab Results  Component Value Date   WBC 10.7 (H) 02/17/2021   HGB 11.3 (L) 02/17/2021   HCT 35.3 (L) 02/17/2021   MCV 81.1 02/17/2021   PLT 457 (H) 02/17/2021    Lab Results  Component Value Date   CREATININE 0.39 (L) 02/17/2021   BUN 6 (L) 02/17/2021   NA 130 (L) 02/17/2021   K 4.5 02/17/2021   CL 92 (L) 02/17/2021   CO2 28 02/17/2021    Lab Results  Component Value Date   ALT 16 01/22/2021   AST 22 01/22/2021   ALKPHOS 95 01/22/2021   BILITOT 1.1 01/22/2021     Microbiology: Recent Results (from the past 240 hour(s))  Resp Panel by RT-PCR (Flu A&B, Covid) Nasopharyngeal Swab     Status: None   Collection Time: 02/12/21  3:44 PM   Specimen: Nasopharyngeal Swab; Nasopharyngeal(NP) swabs in vial transport medium  Result Value Ref Range Status   SARS Coronavirus 2 by RT PCR NEGATIVE NEGATIVE Final    Comment: (NOTE) SARS-CoV-2 target nucleic acids are NOT DETECTED.  The SARS-CoV-2 RNA is generally detectable in upper respiratory specimens during the acute phase of infection. The lowest concentration of SARS-CoV-2 viral copies this assay can detect is 138 copies/mL. A  negative result does not preclude SARS-Cov-2 infection and should not be used as the sole basis for treatment or other patient management decisions. A negative result may occur with  improper specimen collection/handling, submission of specimen other than nasopharyngeal swab, presence of viral mutation(s) within the areas targeted by this assay, and inadequate number of viral copies(<138 copies/mL). A negative result must be combined with clinical observations, patient history, and epidemiological information. The expected result is Negative.  Fact Sheet for Patients:  EntrepreneurPulse.com.au  Fact Sheet for Healthcare Providers:  IncredibleEmployment.be  This test is no t yet approved or cleared by the [place] FDA and  has been authorized for detection and/or diagnosis of SARS-CoV-2 by FDA under an Emergency Use Authorization (EUA). This EUA will remain  in effect (meaning this test can be used) for the duration of the COVID-19 declaration under Section 564(b)(1) of the Act, 21 U.S.C.section 360bbb-3(b)(1), unless the authorization is terminated  or revoked sooner.       Influenza A by PCR NEGATIVE NEGATIVE Final   Influenza B by PCR NEGATIVE NEGATIVE Final  Comment: (NOTE) The Xpert Xpress SARS-CoV-2/FLU/RSV plus assay is intended as an aid in the diagnosis of influenza from Nasopharyngeal swab specimens and should not be used as a sole basis for treatment. Nasal washings and aspirates are unacceptable for Xpert Xpress SARS-CoV-2/FLU/RSV testing.  Fact Sheet for Patients: EntrepreneurPulse.com.au  Fact Sheet for Healthcare Providers: IncredibleEmployment.be  This test is not yet approved or cleared by the [place] FDA and has been authorized for detection and/or diagnosis of SARS-CoV-2 by FDA under an Emergency Use Authorization (EUA). This EUA will remain in effect (meaning this test can  be used) for the duration of the COVID-19 declaration under Section 564(b)(1) of the Act, 21 U.S.C. section 360bbb-3(b)(1), unless the authorization is terminated or revoked.  Performed at [facility], [place] [address] [place], [place] [address]   Culture, blood (routine x 2)     Status: None (Preliminary result)   Collection Time: 02/13/21  2:25 PM   Specimen: BLOOD  Result Value Ref Range Status   Specimen Description BLOOD LEFT ANTECUBITAL  Final   Special Requests   Final    BOTTLES DRAWN AEROBIC AND ANAEROBIC Blood Culture adequate volume   Culture   Final    NO GROWTH 3 DAYS Performed at [facility], [place] [address] [place], [place] [address]    Report Status PENDING  Incomplete  Culture, blood (routine x 2)     Status: None (Preliminary result)   Collection Time: 02/13/21  2:29 PM   Specimen: BLOOD  Result Value Ref Range Status   Specimen Description BLOOD RIGHT ANTECUBITAL  Final   Special Requests   Final    BOTTLES DRAWN AEROBIC AND ANAEROBIC Blood Culture adequate volume   Culture   Final    NO GROWTH 3 DAYS Performed at [facility], [address] [place], [place] [address]    Report Status PENDING  Incomplete  Expectorated Sputum Assessment w Gram Stain, Rflx to Resp Cult     Status: None   Collection Time: 02/13/21  3:13 PM   Specimen: Expectorated Sputum  Result Value Ref Range Status   Specimen Description EXPECTORATED SPUTUM  Final   Special Requests Normal  Final   Sputum evaluation   Final    THIS SPECIMEN IS ACCEPTABLE FOR SPUTUM CULTURE Performed at [facility], [place] [address], [place], [place] [address]    Report Status 02/14/2021 FINAL  Final  Culture, Respiratory w Gram Stain     Status: None   Collection Time: 02/13/21  3:13 PM  Result Value Ref Range Status   Specimen Description EXPECTORATED SPUTUM  Final   Special Requests Normal Reflexed from [id number]  Final   Gram Stain   Final    MODERATE WBC PRESENT,BOTH PMN  AND MONONUCLEAR FEW GRAM POSITIVE COCCI RARE GRAM VARIABLE ROD    Culture   Final    MODERATE Normal respiratory flora-no Staph aureus or Pseudomonas seen Performed at [facility], [place] [address] [place], [place] [address]    Report Status 02/16/2021 FINAL  Final  Culture, Urine     Status: Abnormal   Collection Time: 02/13/21 10:03 PM   Specimen: Urine, Clean Catch  Result Value Ref Range Status   Specimen Description URINE, CLEAN CATCH  Final   Special Requests NONE  Final   Culture (A)  Final    <10,000 COLONIES/mL INSIGNIFICANT GROWTH Performed at [facility], [place] [address] [place], [place] [address]  Report Status 02/15/2021 FINAL  Final  Culture, respiratory     Status: None (Preliminary result)   Collection Time: 02/16/21 12:12 PM   Specimen: Bronchoalveolar Lavage; Respiratory  Result Value Ref Range Status   Specimen Description BRONCHIAL ALVEOLAR LAVAGE  Final   Special Requests NONE  Final   Gram Stain   Final    FEW WBC PRESENT, PREDOMINANTLY PMN NO ORGANISMS SEEN    Culture   Final    NO GROWTH < 24 HOURS Performed at [facility], [address] [place], [place] [address]    Report Status PENDING  Incomplete  Culture, respiratory     Status: None (Preliminary result)   Collection Time: 02/16/21 12:48 PM   Specimen: Bronchoalveolar Lavage; Respiratory  Result Value Ref Range Status   Specimen Description BRONCHIAL ALVEOLAR LAVAGE  Final   Special Requests LINGULA  Final   Gram Stain PENDING  Incomplete   Culture   Final    NO GROWTH < 24 HOURS Performed at [facility], [place] [address] [place], [place] [address]    Report Status PENDING  Incomplete     Serology: hiv negative Cryptococcal ag negative Pending endemic fungi serology 3/07 & 3/08 rifampin/mtb pcr pending  Micro: 3/07 BAL fluid LUL  afb stain negative; cx in progress Fungal cx in progress Respiratory culture ngtd Wbc 7026 >37%  neutrophilic  Path: 8/58 BAL LUL cytology/biopsy in progress  Imaging: If present, new imagings (plain films, ct scans, and mri) have been personally visualized and interpreted; radiology reports have been reviewed. Decision making incorporated into the Impression / Recommendations.  3/07 cxr Indwelling left chest pigtail No pneumothorax status post bronchoscopy. Otherwise unchanged  3/03 chest ct 1. Bilateral areas of airspace disease, much more pronounced throughout the left lung with areas of dense consolidation and thick-walled cavities as above. Overall, findings favor atypical infection such as fungal pneumonia or even tuberculosis. Sputum analysis may be useful. Cavitating malignancy cannot be completely excluded, though the widespread disease would be atypical. 2. Multiple subcentimeter solid nodules within the right upper lobe, largest measuring 9 mm. 3. Trace residual left pneumothorax with indwelling left chest tube as above. 4. Trace left pleural effusion. 5. No evidence of pulmonary embolus. 6.  Aortic Atherosclerosis  01/22/21 ct lumbar spine 1. Acute biconvex compression deformity of the T12 vertebral body with acute transcortical lucency through the superior and inferior endplates as well as the anterior and posterior cortices technically qualifying this as a burst fracture (AO spine A4) albeit with only minimal 2 mm of retropulsion of the superior endplate of approximately 2 mm without resulting canal stenosis. No clear extension into the posterior tension band. Mild paravertebral swelling and thickening adjacent the fracture line compatible with an acute injury. Intervertebral gas likely related to intravasation of the degenerative nitrogenous gas phenomenon seen within the adjacent disc spaces. 2. Discogenic and facet degenerative changes throughout the lumbar spine as described above. 3. Aortic Atherosclerosis  Jabier Mutton, [place] [facility]  [facility] [place] [phone] pager    02/17/2021, 10:02 AM

## 2021-02-17 NOTE — Progress Notes (Addendum)
PROGRESS NOTE    [name] [name]  SNK:[id number] DOB: 1956-01-09 DOA: 02/12/2021 PCP: Rudene Anda, MD   Brief Narrative:  HPI On 02/12/2021 by Dr. Ashok Croon is a 65 y.o. female with a PMH significant for type 2 diabetes, recent vertebral fracture, tobacco use, hyperlipidemia, hypothyroidism.  [name] initially saw her PCP last week for shortness of breath and was scheduled for a chest x-ray which took place earlier today.  She presented to the emergency room from the imaging center via ambulance due to the pneumothorax seen on chest x-ray. In the ED, it was found that they had approximately 90% left tension hydropneumothorax with severe compression and atelectasis of the lung medially and marked mediastinal shift to the right, normal oxygen saturation on room air and not in respiratory distress. They were treated with left chest tube and 2 L oxygen via nasal cannula.  Her vital signs remained stable and interval chest x-ray showed decreased shift. Patient was admitted to medicine service for further workup and management of chronic medical conditions as outlined in detail below.  Additionally, patient endorses 1/2 pack/day for 30-year smoking history.  She stopped smoking 2 years ago.  Denies having any mammograms or colonoscopies and declines those cancer screenings at this time.  Patient has had recent unintentional weight loss and she attributes this to her back pain from recent fall.  Patient was treated approximately 3 weeks ago for vertebral compression fracture after fall in the bathroom.  At baseline, patient can ambulate and perform ADLs without difficulty.  Interim history Admitted for pneumothorax. CT chest obtained showing cavitary lesion. Patient with chest tube in place and cardiothoracic surgery following.  Pulmonology consulted for bronchoscopy, biopsies pending. Assessment & Plan   Dyspnea secondary to spontaneous left tension pneumothorax -Status post  left pigtail-clamped today.  -Repeat CXR on 3/9 -CT surgery consulted and appreciated -High concern for malignancy or infection  Possible cavitary lesion  -CTA chest showed bilateral areas of airspace disease, dense consolidation and thick walled cavities, findings favor atypical infection or fungal pneumonia or TB.  Cavitating malignancy cannot be excluded.  Multiple subcentimeter solid nodules in the right upper lobe, measuring 9 mm  -Chest x-ray today showed large left apex cavitary lesion as well as bilateral infiltrates worse in the left base than the right -Pulmonology consulted and appreciated -Placed on airborne precautions -Respiratory culture showing few gram-positive cocci, rare Gram variable rods-normal respiratory flora, no staph aureus or Pseudomonas seen -Status post bronchoscopy on 02/16/21, which showed purulent secretions in the left mainstem bronchi, as well as left bronchial tree. -Currently pending biopsy results as well as cytology and cultures.  SIRS -Patient with leukocytosis of 16.4, tachycardia as well as tachypnea on 02/14/2021 -Leukocytosis has resolved, patient continues to have mild tachycardia with fever -Possibly related to the above -UA unremarkable for infection. Urine culture showed <10K of insignificant growth -Chest x-ray as above -Blood cultures no growth to date -Discussed with infectious disease, Dr. Gale Journey, recommended starting Augmentin.  Diabetes mellitus, type II, poorly controlled -Hemoglobin A1c 10.8 -Home medications Amaryl and Metformin were held -Patient continues to refuse insulin sliding scale -We will continue CBG monitoring -Have had a long conversation with patient and explained the reasoning for being on insulin however she refuses -Continue amaryl- will increase to 2 mg (home dose)  Hypothyroidism -Continue Synthroid  Thoracic vertebral compression fracture -Present on admission -Continue nasal calcitonin, PRN tramadol and morphine,  lidocaine patch -PT and OT recommending home health  Mild hyponatremia -Patient  asymptomatic -Sodium currently 130, will continue to monitor BMP  Malnutrition/underweight -BMI 15.95  -Nutrition consulted  DVT Prophylaxis  SCDs  Code Status: Full  Family Communication: None at bedside  Disposition Plan:  Status is: Inpatient  Remains inpatient appropriate because:Inpatient level of care appropriate due to severity of illness   Dispo: The patient is from: Home              Anticipated d/c is to: Home              Patient currently is not medically stable to d/c.   Difficult to place patient No   Consultants Cardiothoracic surgery PCCM  Procedures  Pigtail placement Bronchoscopy  Antibiotics   Anti-infectives (From admission, onward)   None      Subjective:   [name] [name] seen and examined today.  Continues to have cough but feels her breathing has improved.  Denies current chest pain, abdominal pain, nausea or vomiting, diarrhea or constipation, dizziness or headache.   Objective:   Vitals:   02/16/21 2130 02/17/21 0415 02/17/21 0745 02/17/21 0850  BP:  140/83 133/76   Pulse:  (!) 109 97   Resp:  19 (!) 21   Temp: 98 F (36.7 C) 99.3 F (37.4 C) (!) 101.2 F (38.4 C) 100.1 F (37.8 C)  TempSrc: Oral Oral Oral Oral  SpO2:  98% 98%   Weight:  45.6 kg    Height:        Intake/Output Summary (Last 24 hours) at 02/17/2021 0926 Last data filed at 02/17/2021 0416 Gross per 24 hour  Intake 1040 ml  Output 1200 ml  Net -160 ml   Filed Weights   02/15/21 0022 02/16/21 0000 02/17/21 0415  Weight: 44.6 kg 45 kg 45.6 kg   Exam  General: Well developed, frail, ill-appearing, NAD  HEENT: NCAT, mucous membranes moist.   Cardiovascular: S1 S2 auscultated, RRR, no murmur  Respiratory: Diminished breath sounds on the left, improving.  Clear on the right.  Chest tube in place on the left.  Abdomen: Soft, nontender, nondistended, + bowel  sounds  Extremities: warm dry without cyanosis clubbing.  Trace LE edema  Neuro: AAOx3, nonfocal  Psych: pleasant, appropriate mood and affect  Data Reviewed: I have personally reviewed following labs and imaging studies  CBC: Recent Labs  Lab 02/12/21 1336 02/14/21 0444 02/15/21 0327 02/16/21 0459 02/17/21 0432  WBC 9.3 16.4* 13.5* 10.6* 10.7*  NEUTROABS 6.8  --   --   --   --   HGB 12.4 10.9* 10.7* 11.1* 11.3*  HCT 38.8 31.6* 33.0* 34.4* 35.3*  MCV 83.4 78.6* 80.5 80.0 81.1  PLT 578* 422* 443* 459* 409*   Basic Metabolic Panel: Recent Labs  Lab 02/12/21 1336 02/14/21 0444 02/15/21 0327 02/16/21 0459 02/17/21 0432  NA 133* 125* 128* 129* 130*  K 4.3 3.6 3.7 3.8 4.5  CL 97* 91* 91* 92* 92*  CO2 _0 GLUCOSE 243* 176* 137* 233* 241*  BUN _1 6*  CREATININE 0.50 0.41* <0.30* 0.31* 0.39*  CALCIUM 9.2 8.3* 8.4* 8.5* 8.7*   GFR: Estimated Creatinine Clearance: 52.5 mL/min (A) (by C-G formula based on SCr of 0.39 mg/dL (L)). Liver Function Tests: No results for input(s): AST, ALT, ALKPHOS, BILITOT, PROT, ALBUMIN in the last 168 hours. No results for input(s): LIPASE, AMYLASE in the last 168 hours. No results for input(s): AMMONIA in the last 168 hours. Coagulation Profile: No results for input(s): INR, PROTIME  in the last 168 hours. Cardiac Enzymes: No results for input(s): CKTOTAL, CKMB, CKMBINDEX, TROPONINI in the last 168 hours. BNP (last 3 results) No results for input(s): PROBNP in the last 8760 hours. HbA1C: No results for input(s): HGBA1C in the last 72 hours. CBG: Recent Labs  Lab 02/16/21 1130 02/16/21 1600 02/16/21 1959 02/17/21 0615 02/17/21 0742  GLUCAP 260* 232* 317* 238* 244*   Lipid Profile: No results for input(s): CHOL, HDL, LDLCALC, TRIG, CHOLHDL, LDLDIRECT in the last 72 hours. Thyroid Function Tests: No results for input(s): TSH, T4TOTAL, FREET4, T3FREE, THYROIDAB in the last 72 hours. Anemia Panel: No results for  input(s): VITAMINB12, FOLATE, FERRITIN, TIBC, IRON, RETICCTPCT in the last 72 hours. Urine analysis:    Component Value Date/Time   COLORURINE YELLOW 02/13/2021 1900   APPEARANCEUR CLEAR 02/13/2021 1900   LABSPEC 1.013 02/13/2021 1900   PHURINE 6.0 02/13/2021 1900   GLUCOSEU 150 (A) 02/13/2021 1900   HGBUR SMALL (A) 02/13/2021 1900   HGBUR negative 01/19/2008 0750   BILIRUBINUR NEGATIVE 02/13/2021 1900   KETONESUR NEGATIVE 02/13/2021 1900   PROTEINUR NEGATIVE 02/13/2021 1900   UROBILINOGEN 0.2 01/19/2008 0750   NITRITE NEGATIVE 02/13/2021 1900   LEUKOCYTESUR NEGATIVE 02/13/2021 1900   Sepsis Labs: _0 (procalcitonin:4,lacticidven:4)  ) Recent Results (from the past 240 hour(s))  Resp Panel by RT-PCR (Flu A&B, Covid) Nasopharyngeal Swab     Status: None   Collection Time: 02/12/21  3:44 PM   Specimen: Nasopharyngeal Swab; Nasopharyngeal(NP) swabs in vial transport medium  Result Value Ref Range Status   SARS Coronavirus 2 by RT PCR NEGATIVE NEGATIVE Final    Comment: (NOTE) SARS-CoV-2 target nucleic acids are NOT DETECTED.  The SARS-CoV-2 RNA is generally detectable in upper respiratory specimens during the acute phase of infection. The lowest concentration of SARS-CoV-2 viral copies this assay can detect is 138 copies/mL. A negative result does not preclude SARS-Cov-2 infection and should not be used as the sole basis for treatment or other patient management decisions. A negative result may occur with  improper specimen collection/handling, submission of specimen other than nasopharyngeal swab, presence of viral mutation(s) within the areas targeted by this assay, and inadequate number of viral copies(<138 copies/mL). A negative result must be combined with clinical observations, patient history, and epidemiological information. The expected result is Negative.  Fact Sheet for Patients:  EntrepreneurPulse.com.au  Fact Sheet for Healthcare  Providers:  IncredibleEmployment.be  This test is no t yet approved or cleared by the [place] FDA and  has been authorized for detection and/or diagnosis of SARS-CoV-2 by FDA under an Emergency Use Authorization (EUA). This EUA will remain  in effect (meaning this test can be used) for the duration of the COVID-19 declaration under Section 564(b)(1) of the Act, 21 U.S.C.section 360bbb-3(b)(1), unless the authorization is terminated  or revoked sooner.       Influenza A by PCR NEGATIVE NEGATIVE Final   Influenza B by PCR NEGATIVE NEGATIVE Final    Comment: (NOTE) The Xpert Xpress SARS-CoV-2/FLU/RSV plus assay is intended as an aid in the diagnosis of influenza from Nasopharyngeal swab specimens and should not be used as a sole basis for treatment. Nasal washings and aspirates are unacceptable for Xpert Xpress SARS-CoV-2/FLU/RSV testing.  Fact Sheet for Patients: EntrepreneurPulse.com.au  Fact Sheet for Healthcare Providers: IncredibleEmployment.be  This test is not yet approved or cleared by the [place] FDA and has been authorized for detection and/or diagnosis of SARS-CoV-2 by FDA under an Emergency Use Authorization (EUA). This EUA  will remain in effect (meaning this test can be used) for the duration of the COVID-19 declaration under Section 564(b)(1) of the Act, 21 U.S.C. section 360bbb-3(b)(1), unless the authorization is terminated or revoked.  Performed at [facility], [place] [address] [place], [place] [address]   Culture, blood (routine x 2)     Status: None (Preliminary result)   Collection Time: 02/13/21  2:25 PM   Specimen: BLOOD  Result Value Ref Range Status   Specimen Description BLOOD LEFT ANTECUBITAL  Final   Special Requests   Final    BOTTLES DRAWN AEROBIC AND ANAEROBIC Blood Culture adequate volume   Culture   Final    NO GROWTH 3 DAYS Performed at [facility], [place]  [address], [place], [place] [address]    Report Status PENDING  Incomplete  Culture, blood (routine x 2)     Status: None (Preliminary result)   Collection Time: 02/13/21  2:29 PM   Specimen: BLOOD  Result Value Ref Range Status   Specimen Description BLOOD RIGHT ANTECUBITAL  Final   Special Requests   Final    BOTTLES DRAWN AEROBIC AND ANAEROBIC Blood Culture adequate volume   Culture   Final    NO GROWTH 3 DAYS Performed at [facility], [address] [place], [place] [address]    Report Status PENDING  Incomplete  Expectorated Sputum Assessment w Gram Stain, Rflx to Resp Cult     Status: None   Collection Time: 02/13/21  3:13 PM   Specimen: Expectorated Sputum  Result Value Ref Range Status   Specimen Description EXPECTORATED SPUTUM  Final   Special Requests Normal  Final   Sputum evaluation   Final    THIS SPECIMEN IS ACCEPTABLE FOR SPUTUM CULTURE Performed at [facility], [place] [address] [place], [place] [address]    Report Status 02/14/2021 FINAL  Final  Culture, Respiratory w Gram Stain     Status: None   Collection Time: 02/13/21  3:13 PM  Result Value Ref Range Status   Specimen Description EXPECTORATED SPUTUM  Final   Special Requests Normal Reflexed from [id number]  Final   Gram Stain   Final    MODERATE WBC PRESENT,BOTH PMN AND MONONUCLEAR FEW GRAM POSITIVE COCCI RARE GRAM VARIABLE ROD    Culture   Final    MODERATE Normal respiratory flora-no Staph aureus or Pseudomonas seen Performed at [facility], [place] [address], [place], [place] [address]    Report Status 02/16/2021 FINAL  Final  Culture, Urine     Status: Abnormal   Collection Time: 02/13/21 10:03 PM   Specimen: Urine, Clean Catch  Result Value Ref Range Status   Specimen Description URINE, CLEAN CATCH  Final   Special Requests NONE  Final   Culture (A)  Final    <10,000 COLONIES/mL INSIGNIFICANT GROWTH Performed at [facility], [place] [address], [place], [place] [address]    Report  Status 02/15/2021 FINAL  Final  Culture, respiratory     Status: None (Preliminary result)   Collection Time: 02/16/21 12:12 PM   Specimen: Bronchoalveolar Lavage; Respiratory  Result Value Ref Range Status   Specimen Description BRONCHIAL ALVEOLAR LAVAGE  Final   Special Requests NONE  Final   Gram Stain   Final    FEW WBC PRESENT, PREDOMINANTLY PMN NO ORGANISMS SEEN    Culture   Final    NO GROWTH < 24 HOURS Performed at [facility], [place] [address] [place], [place] [address]    Report Status PENDING  Incomplete  Culture, respiratory     Status: None (Preliminary result)   Collection Time: 02/16/21 12:48 PM   Specimen: Bronchoalveolar Lavage; Respiratory  Result Value Ref Range Status   Specimen Description BRONCHIAL ALVEOLAR LAVAGE  Final   Special Requests LINGULA  Final   Gram Stain PENDING  Incomplete   Culture   Final    NO GROWTH < 24 HOURS Performed at [facility], [place] [address], [place], [place] [address]    Report Status PENDING  Incomplete      Radiology Studies: DG CHEST PORT 1 VIEW  Result Date: 02/16/2021 CLINICAL DATA:  Status post bronchoscopy with biopsy EXAM: PORTABLE CHEST 1 VIEW COMPARISON:  02/16/2021 at 0554 hours FINDINGS: Patchy/airspace opacities in the left mid/lower lung with stable cavitary lesion in the left lung apex. Reticulonodular opacities throughout the right hemithorax. Indwelling left pigtail chest drain. No pneumothorax status post bronchoscopy. Leftward cardiomediastinal shift. IMPRESSION: No pneumothorax status post bronchoscopy. Otherwise unchanged. Electronically Signed   By: Julian Hy M.D.   On: 02/16/2021 15:52   DG Chest Port 1 View  Result Date: 02/16/2021 CLINICAL DATA:  Chest tube TB? EXAM: PORTABLE CHEST 1 VIEW COMPARISON:  02/15/2021 FINDINGS: Left chest tube unchanged in position. Left upper lobe cavitary mass again seen. Focal airspace opacity again noted within the right upper lobe. Cardiac silhouette  obscured by left lung base opacity. IMPRESSION: No significant interval change in appearance of the chest left upper lobe cavitary mass and right upper and left lower lobe airspace opacity again seen. Electronically Signed   By: Miachel Roux M.D.   On: 02/16/2021 08:00     Scheduled Meds: . atorvastatin  10 mg Oral QPM  . calcitonin (salmon)  1 spray Alternating Nares Daily  . cholecalciferol  1,000 Units Oral Daily  . glimepiride  1 mg Oral Q breakfast  . insulin aspart  0-5 Units Subcutaneous QHS  . insulin aspart  0-9 Units Subcutaneous TID WC  . levothyroxine  50 mcg Oral QAC breakfast  . lidocaine  1 patch Transdermal Q24H   Continuous Infusions:   LOS: 5 days   Time Spent in minutes   45 minutes  [name] [name] D.O. on 02/17/2021 at 9:26 AM  Between 7am to 7pm - Please see pager noted on [email or web address]  After 7pm go to [email or web address]  And look for the night coverage person covering for me after hours  [facility] Office  [phone]

## 2021-02-17 NOTE — Progress Notes (Signed)
Inpatient Diabetes Program Recommendations  AACE/ADA: New Consensus Statement on Inpatient Glycemic Control  Target Ranges:  Prepandial:   less than 140 mg/dL      Peak postprandial:   less than 180 mg/dL (1-2 hours)      Critically ill patients:  140 - 180 mg/dL   Results for [name] [name], [name] [name] (MRN [id number]) as of 02/17/2021 08:06  Ref. Range 02/16/2021 05:59 02/16/2021 11:30 02/16/2021 16:00 02/16/2021 19:59 02/17/2021 06:15 02/17/2021 07:42  Glucose-Capillary Latest Ref Range: 70 - 99 mg/dL 845 (H) 364 (H) 680 (H) 317 (H) 238 (H) 244 (H)   Review of Glycemic Control  Diabetes history: DM2 Outpatient Diabetes medications: Amaryl 2 mg QAM Current orders for Inpatient glycemic control: Amaryl 1 mg QAM, Novolog 0-9 units TID with meals, Novolog 0-5 units QHS  Inpatient Diabetes Program Recommendations:    Oral DM medications: Noted patient is refusing Novolog correction. If patient will not agree to take insulin while inpatient, please consider increasing Amaryl to 3 mg daily.  HbgA1C: A1C 10.8% on 02/13/21 indicating an average glucose of 263 mg/dl to evaluate glycemic control over the past 2-3 months. Prior A1C in Feb 2022 was 11.9% (per Endocrinology note). Would strongly recommend patient's outpatient DM medications be adjusted in order to get DM under better control.   NOTE: Per chart noted patient sees Dr. Kathreen Cosier (Endocrinologist) and was last seen on 01/26/21. Per office note by Dr. Kathreen Cosier on 01/26/21, "Patient is from [place] and was going back to [place] for annual exams and med refills.However reports she cannot travel back to [place] at this time. Reports her A1C's have run between 9-14%, and she has been admitted several times while in [place] for hyperglycemia. She has refused insulin therapy in the past. She has refused additional therapy for her diabetes, or changes in her regimen in the past. Although it is not ideal, we will continue metformin and increase her glimepiride." Patient's A1C  was noted to be 11.9% and patient was asked to continue Metformin 1000 mg BID and increase Amaryl to 4 mg QAM.   Thanks, Orlando Penner, RN, MSN, CDE Diabetes Coordinator Inpatient Diabetes Program [phone] (Team Pager from 8am to 5pm)

## 2021-02-17 NOTE — Progress Notes (Signed)
Occupational Therapy Treatment Patient Details Name: [name] [name] MRN: [id number] DOB: 1956-11-26 Today's Date: 02/17/2021    History of present illness 65 y.o. female with admitted from PCP office on 3/3 with shortness of breath, CXR showing pneumothorax, s/p chest tube placement on 3/3. Workup for TB, vs atypical infection, vs malignancy, plan for bronchoscopy on 3/7. Pt with recent fall, compression fracture of T12 (unsure if from fall or not). PMH significant for type 2 diabetes, recent vertebral fracture, tobacco use, hyperlipidemia, hypothyroidism.   OT comments  Pt making gradual progress towards OT goals this session. Pt slightly self limiting this session as pt initially stating that she was agreeable to mobilize OOB but then declines OOB session stating that she just needed to "rest and relax." Pt was agreeable to EOB session for BADL reeducation. Pt requires no more than supervision for EOB UB ADLs. Pt additionally declined light BUE/BLE therex from EOB returning self to supine. Dc plan currently remains appropriate,will continue to follow acutely per POC.    Follow Up Recommendations  Other (comment) (? HH PT)    Equipment Recommendations  Other (comment) (? RW versus a Cane)    Recommendations for Other Services      Precautions / Restrictions Precautions Precautions: Fall Precaution Comments: Chest tube to wall suction, can be to water seal for mobility. On airborne precautions Restrictions Weight Bearing Restrictions: No       Mobility Bed Mobility Overal bed mobility: Needs Assistance Bed Mobility: Rolling;Sidelying to Sit;Sit to Supine Rolling: Modified independent (Device/Increase time) Sidelying to sit: Modified independent (Device/Increase time)     Sit to sidelying: Modified independent (Device/Increase time) General bed mobility comments: no physical assist needed, use of bed rails to transition into sitting    Transfers                  General transfer comment: pt declined OOB mobility    Balance Overall balance assessment: Needs assistance Sitting-balance support: Feet supported;No upper extremity supported Sitting balance-Leahy Scale: Fair Sitting balance - Comments: sitting EOB for grooming tasks with no UE support or LOB                                   ADL either performed or assessed with clinical judgement   ADL Overall ADL's : Needs assistance/impaired     Grooming: Oral care;Wash/dry face;Sitting;Supervision/safety;Set up Grooming Details (indicate cue type and reason): sitting EOB                   Toilet Transfer Details (indicate cue type and reason): pt declined OOB mobiltiy         Functional mobility during ADLs: Supervision/safety (bed mobility only) General ADL Comments: pt declined OOB mobiltiy this session but agreeable to EOB, pt completed UB grooming tasks from EOB with set- up assist, pt additionally declined seated therex to facilitate improvements in activity tolerance     Vision       Perception     Praxis      Cognition Arousal/Alertness: Awake/alert Behavior During Therapy: WFL for tasks assessed/performed;Agitated (moments of agitation) Overall Cognitive Status: Within Functional Limits for tasks assessed                                          Exercises     Shoulder  Instructions       General Comments pt on 4L [place] with sats 97-98% during session    Pertinent Vitals/ Pain       Pain Assessment: No/denies pain  Home Living                                          Prior Functioning/Environment              Frequency  Min 2X/week        Progress Toward Goals  OT Goals(current goals can now be found in the care plan section)  Progress towards OT goals: Progressing toward goals  Acute Rehab OT Goals Patient Stated Goal: wants to rest OT Goal Formulation: With patient Time For Goal  Achievement: 02/27/21 Potential to Achieve Goals: Fair  Plan Discharge plan remains appropriate;Frequency remains appropriate    Co-evaluation                 AM-PAC OT "6 Clicks" Daily Activity     Outcome Measure   Help from another person eating meals?: None Help from another person taking care of personal grooming?: A Little Help from another person toileting, which includes using toliet, bedpan, or urinal?: A Little Help from another person bathing (including washing, rinsing, drying)?: A Little Help from another person to put on and taking off regular upper body clothing?: None Help from another person to put on and taking off regular lower body clothing?: A Little 6 Click Score: 20    End of Session Equipment Utilized During Treatment: Oxygen;Other (comment) (4L [place])  OT Visit Diagnosis: Unsteadiness on feet (R26.81)   Activity Tolerance Other (comment) (self limiting)   Patient Left in bed;with call bell/phone within reach   Nurse Communication Mobility status        Time: 1031-5945 OT Time Calculation (min): 13 min  Charges: OT General Charges $OT Visit: 1 Visit OT Treatments $Self Care/Home Management : 8-22 mins  Lenor Derrick., COTA/L Acute Rehabilitation Services [phone]    Barron Schmid 02/17/2021, 10:37 AM

## 2021-02-17 NOTE — Anesthesia Postprocedure Evaluation (Signed)
Anesthesia Post Note  Patient: Judaea Rafanan  Procedure(s) Performed: VIDEO BRONCHOSCOPY WITH FLUORO (Left ) BRONCHIAL BIOPSIES BRONCHIAL BRUSHINGS BRONCHIAL WASHINGS     Patient location during evaluation: PACU Anesthesia Type: MAC Level of consciousness: awake and alert and oriented Pain management: pain level controlled Vital Signs Assessment: post-procedure vital signs reviewed and stable Respiratory status: spontaneous breathing, nonlabored ventilation and respiratory function stable Cardiovascular status: blood pressure returned to baseline Postop Assessment: no apparent nausea or vomiting Anesthetic complications: no   No complications documented.  Last Vitals:  Vitals:   02/17/21 0745 02/17/21 0850  BP: 133/76   Pulse: 97   Resp: (!) 21   Temp: (!) 38.4 C 37.8 C  SpO2: 98%     Last Pain:  Vitals:   02/17/21 0850  TempSrc: Oral  PainSc:                  Brennan Bailey

## 2021-02-17 NOTE — Progress Notes (Signed)
   02/17/21 2007  Assess: MEWS Score  Temp (!) 100.9 F (38.3 C)  BP (!) 135/93  Pulse Rate (!) 104  ECG Heart Rate (!) 104  Resp 18  Level of Consciousness Alert  SpO2 97 %  O2 Device Nasal Cannula  O2 Flow Rate (L/min) 4 L/min  Assess: MEWS Score  MEWS Temp 1  MEWS Systolic 0  MEWS Pulse 1  MEWS RR 0  MEWS LOC 0  MEWS Score 2  MEWS Score Color Yellow  Assess: if the MEWS score is Yellow or Red  Were vital signs taken at a resting state? Yes  Focused Assessment No change from prior assessment  Early Detection of Sepsis Score *See Row Information* Low  MEWS guidelines implemented *See Row Information* Yes  Treat  MEWS Interventions Administered prn meds/treatments  Pain Scale 0-10  Pain Score 0  Take Vital Signs  Increase Vital Sign Frequency  Yellow: Q 2hr X 2 then Q 4hr X 2, if remains yellow, continue Q 4hrs  Escalate  MEWS: Escalate Yellow: discuss with charge nurse/RN and consider discussing with provider and RRT  Notify: Charge Nurse/RN  Name of Charge Nurse/RN Notified [place], RN  Date Charge Nurse/RN Notified 02/17/21  Time Charge Nurse/RN Notified 2014  Document  Patient Outcome Not stable and remains on department  Progress note created (see row info) Yes  Yellow MEWS for temp 100.9 and HR 109.  Tylenol given.  Patient has been having intermittent fevers for past 24 hours.  Antibiotics started today.  Will continue per guidelines.

## 2021-02-17 NOTE — Consult Note (Signed)
NAMEKassey [name], MRN:  [id number], DOB:  06-15-1956, LOS: 5 ADMISSION DATE:  02/12/2021, CONSULTATION DATE:  02/13/2021 REFERRING MD:  Curly Shores MD, CHIEF COMPLAINT: Abnormal CT scan, rule out TB  Brief History:   65 year old [name] [name] with diabetes, vertebral fracture, tobacco use.  Presenting with large left hydropneumothorax s/p chest tube placement in the ED. CT shows airspace consolidation, cavitary lesions left greater than right.  She is febrile today prompting placement in airborne isolation due to concern for TB and PCCM consultation for possible bronchoscopy Complains of chronic cough, occasional sputum production.  Past Medical History:   Diabetes, hyperlipidemia, thyroid disease  Social history notable for 15-pack-year smoker.  Quit in 2020.  She denies exposure to TB, no being homeless or incarcerated. She is an immigrant from [place] over 20 years ago but still travels back every year.  Used to work as a Corporate treasurer.  Denies any significant occupational or home exposures  She has about 15 kg weight loss over 3 months which she attributes to her recent fall and vertebral compression fracture, no loss of appetite.  Denies hemoptysis.  Significant Hospital Events:   3/3-admit with pneumothorax, chest tube placement on the left  Consults:   Cardiothoracic surgery, PCCM  Procedures:    Significant Diagnostic Tests:   CTA 02/12/2021-bilateral airspace disease with dense consolidation, cavitary lesions of the left, dense airspace consolidation of the lower lobe, multiple small pulmonary nodules.  Micro Data:    Antimicrobials:    Interim History / Subjective:   No acute events overnight. Tolerated bronchoscopy well yesterday. No new issues at this time.  Objective   Blood pressure 133/76, pulse 97, temperature 100.1 F (37.8 C), temperature source Oral, resp. rate (!) 21, height _0  (1.676 m), weight 45.6 kg, SpO2 98 %.         Intake/Output Summary (Last 24 hours) at 02/17/2021 1123 Last data filed at 02/17/2021 1108 Gross per 24 hour  Intake 1160 ml  Output 1600 ml  Net -440 ml   Filed Weights   02/15/21 0022 02/16/21 0000 02/17/21 0415  Weight: 44.6 kg 45 kg 45.6 kg    Examination: Blood pressure 129/73, pulse 99, temperature (!) 101.4 F (38.6 C), temperature source Oral, resp. rate 19, height _1  (1.676 m), weight 44.7 kg, SpO2 97 %. Gen:      No acute distress, thin appearing HEENT:  EOMI, sclera anicteric Neck:     No masses; no thyromegaly Lungs:    Diminished breath sounds on the left with crackles. No wheezing or rhonchi. CV:         Regular rate and rhythm; no murmurs Abd:      + bowel sounds; soft, non-tender; no palpable masses, no distension Ext:    No edema; adequate peripheral perfusion Skin:      Warm and dry; no rash Neuro: alert and oriented x 3  Resolved Hospital Problem list     Assessment & Plan:  Left lung consolidation, cavitation with bilateral airspace disease. Presentation is concerning for tuberculosis.  Differential diagnosis includes fungal infection, malignancy given her smoking history Agree with airborne isolation Follow up BAL cultures. Neutrophil predominant BALs on cell count/differential. Follow up cytology/pathology  Left pneumothorax s/p chest tube Chest tube placed on clamp trial today Follow chest x-ray tomorrow  Best practice (evaluated daily)  Per primary  Labs   CBC: Recent Labs  Lab 02/12/21 1336 02/14/21 0444 02/15/21 0327 02/16/21 0459 02/17/21 0432  WBC 9.3 16.4*  13.5* 10.6* 10.7*  NEUTROABS 6.8  --   --   --   --   HGB 12.4 10.9* 10.7* 11.1* 11.3*  HCT 38.8 31.6* 33.0* 34.4* 35.3*  MCV 83.4 78.6* 80.5 80.0 81.1  PLT 578* 422* 443* 459* 457*    Basic Metabolic Panel: Recent Labs  Lab 02/12/21 1336 02/14/21 0444 02/15/21 0327 02/16/21 0459 02/17/21 0432  NA 133* 125* 128* 129* 130*  K 4.3 3.6 3.7 3.8 4.5  CL 97* 91* 91* 92*  92*  CO2 _0 GLUCOSE 243* 176* 137* 233* 241*  BUN _1 6*  CREATININE 0.50 0.41* <0.30* 0.31* 0.39*  CALCIUM 9.2 8.3* 8.4* 8.5* 8.7*   GFR: Estimated Creatinine Clearance: 52.5 mL/min (A) (by C-G formula based on SCr of 0.39 mg/dL (L)). Recent Labs  Lab 02/14/21 0444 02/15/21 0327 02/16/21 0459 02/17/21 0432  WBC 16.4* 13.5* 10.6* 10.7*    Liver Function Tests: No results for input(s): AST, ALT, ALKPHOS, BILITOT, PROT, ALBUMIN in the last 168 hours. No results for input(s): LIPASE, AMYLASE in the last 168 hours. No results for input(s): AMMONIA in the last 168 hours.  ABG No results found for: PHART, PCO2ART, PO2ART, HCO3, TCO2, ACIDBASEDEF, O2SAT   Coagulation Profile: No results for input(s): INR, PROTIME in the last 168 hours.  Cardiac Enzymes: No results for input(s): CKTOTAL, CKMB, CKMBINDEX, TROPONINI in the last 168 hours.  HbA1C: Hgb A1c MFr Bld  Date/Time Value Ref Range Status  02/13/2021 03:57 AM 10.8 (H) 4.8 - 5.6 % Final    Comment:    (NOTE) Pre diabetes:          5.7%-6.4%  Diabetes:              >6.4%  Glycemic control for   <7.0% adults with diabetes   03/07/2020 11:03 AM 13.7 (H) 4.6 - 6.5 % Final    Comment:    Glycemic Control Guidelines for People with Diabetes:Non Diabetic:  <6%Goal of Therapy: <7%Additional Action Suggested:  >8%     CBG: Recent Labs  Lab 02/16/21 1600 02/16/21 1959 02/17/21 0615 02/17/21 0742 02/17/21 1106  GLUCAP 232* 317* 238* 244* 264*    Signature:   Freda Jackson, MD [facility] Office: [phone]  If no response to pager , please call [phone] until 7pm After 7:00 pm call Elink  [phone] 02/17/2021, 11:23 AM

## 2021-02-17 NOTE — Progress Notes (Addendum)
      [address]       [place] [address]             [phone]       1 Day Post-Op Procedure(s) (LRB): VIDEO BRONCHOSCOPY WITH FLUORO (Left) BRONCHIAL BIOPSIES BRONCHIAL BRUSHINGS BRONCHIAL WASHINGS  Subjective: Patient states "breathing is pretty good".  Objective: Vital signs in last 24 hours: Temp:  [97.6 F (36.4 C)-101.2 F (38.4 C)] 101.2 F (38.4 C) (03/08 0745) Pulse Rate:  [80-109] 97 (03/08 0745) Cardiac Rhythm: Normal sinus rhythm (03/07 1900) Resp:  [14-21] 21 (03/08 0745) BP: (121-140)/(69-83) 133/76 (03/08 0745) SpO2:  [94 %-99 %] 98 % (03/08 0745) Weight:  [45.6 kg] 45.6 kg (03/08 0415)      Intake/Output from previous day: 03/07 0701 - 03/08 0700 In: 1040 [P.O.:240; I.V.:800] Out: 1200 [Urine:1200]   Physical Exam:  Cardiovascular: RRR Pulmonary: Clear to auscultation on right and diminished on left  Abdomen: Soft, non tender, bowel sounds present. Extremities: Mild bilateral lower extremity edema. Wounds: Clean and dry.  No erythema or signs of infection. Chest Tube: to water seal, no air leak  Lab Results: VOZ:DGUYQI Labs    02/16/21 0459 02/17/21 0432  WBC 10.6* 10.7*  HGB 11.1* 11.3*  HCT 34.4* 35.3*  PLT 459* 457*   BMET:  Recent Labs    02/16/21 0459 02/17/21 0432  NA 129* 130*  K 3.8 4.5  CL 92* 92*  CO2 28 28  GLUCOSE 233* 241*  BUN 8 6*  CREATININE 0.31* 0.39*  CALCIUM 8.5* 8.7*    PT/INR: No results for input(s): LABPROT, INR in the last 72 hours. ABG:  INR: Will add last result for INR, ABG once components are confirmed Will add last 4 CBG results once components are confirmed  Assessment/Plan:  1. CV - SR 2.  Pulmonary - Left pneumothorax, s/p left pigtail catheter on 03/03. S/p bronchoscopy and showed purulent secretions in left mainstem bronchi and right bronchial tree. No CXR ordered for today. Chest tube output not recorded so order placed for nurses to do so every shift. On 4  liters of oxygen via [place]. Check CXR in am. 3. On airborne precautions, fever to 101.2. Await AFB results as presentation concerning for TB 4. History of compression fracture T12 5. History of hypothyroidism-continue Levothyroxine 50 mcg daily 6. History of DM-CBGs [id number]. Continue Glimeperide, Insulin PRN. Management per primary  Donielle M ZimmermanPA-C 02/17/2021,7:58 AM [phone]  I have seen and examined the patient and agree with the assessment and plan as outlined.  No air leak on exam.  Will tentatively plan to remove chest tube tomorrow if CXR stable.  Purcell Nails, MD 02/17/2021 2:31 PM

## 2021-02-18 ENCOUNTER — Inpatient Hospital Stay (HOSPITAL_COMMUNITY): Payer: BLUE CROSS/BLUE SHIELD

## 2021-02-18 ENCOUNTER — Encounter (HOSPITAL_COMMUNITY): Payer: Self-pay | Admitting: Pulmonary Disease

## 2021-02-18 DIAGNOSIS — J984 Other disorders of lung: Secondary | ICD-10-CM

## 2021-02-18 DIAGNOSIS — J939 Pneumothorax, unspecified: Secondary | ICD-10-CM | POA: Diagnosis not present

## 2021-02-18 DIAGNOSIS — J9621 Acute and chronic respiratory failure with hypoxia: Secondary | ICD-10-CM | POA: Diagnosis not present

## 2021-02-18 DIAGNOSIS — J181 Lobar pneumonia, unspecified organism: Secondary | ICD-10-CM | POA: Diagnosis not present

## 2021-02-18 DIAGNOSIS — S22080D Wedge compression fracture of T11-T12 vertebra, subsequent encounter for fracture with routine healing: Secondary | ICD-10-CM | POA: Diagnosis not present

## 2021-02-18 DIAGNOSIS — E440 Moderate protein-calorie malnutrition: Secondary | ICD-10-CM

## 2021-02-18 LAB — CULTURE, BLOOD (ROUTINE X 2)
Culture: NO GROWTH
Culture: NO GROWTH
Special Requests: ADEQUATE
Special Requests: ADEQUATE

## 2021-02-18 LAB — CBC
HCT: 35.5 % — ABNORMAL LOW (ref 36.0–46.0)
Hemoglobin: 11.9 g/dL — ABNORMAL LOW (ref 12.0–15.0)
MCH: 26.7 pg (ref 26.0–34.0)
MCHC: 33.5 g/dL (ref 30.0–36.0)
MCV: 79.6 fL — ABNORMAL LOW (ref 80.0–100.0)
Platelets: 452 10*3/uL — ABNORMAL HIGH (ref 150–400)
RBC: 4.46 MIL/uL (ref 3.87–5.11)
RDW: 13.9 % (ref 11.5–15.5)
WBC: 15.7 10*3/uL — ABNORMAL HIGH (ref 4.0–10.5)
nRBC: 0 % (ref 0.0–0.2)

## 2021-02-18 LAB — BASIC METABOLIC PANEL
Anion gap: 12 (ref 5–15)
BUN: 7 mg/dL — ABNORMAL LOW (ref 8–23)
CO2: 26 mmol/L (ref 22–32)
Calcium: 8.5 mg/dL — ABNORMAL LOW (ref 8.9–10.3)
Chloride: 90 mmol/L — ABNORMAL LOW (ref 98–111)
Creatinine, Ser: 0.43 mg/dL — ABNORMAL LOW (ref 0.44–1.00)
GFR, Estimated: >60 mL/min (ref >60)
Glucose, Bld: 278 mg/dL — ABNORMAL HIGH (ref 70–99)
Potassium: 4.3 mmol/L (ref 3.5–5.1)
Sodium: 128 mmol/L — ABNORMAL LOW (ref 135–145)

## 2021-02-18 LAB — MISC LABCORP TEST (SEND OUT): Labcorp test code: 9985

## 2021-02-18 LAB — TROPONIN I (HIGH SENSITIVITY)
Troponin I (High Sensitivity): 34 ng/L — ABNORMAL HIGH (ref <18)
Troponin I (High Sensitivity): 136 ng/L (ref <18)

## 2021-02-18 LAB — GLUCOSE, CAPILLARY
Glucose-Capillary: 290 mg/dL — ABNORMAL HIGH (ref 70–99)
Glucose-Capillary: 210 mg/dL — ABNORMAL HIGH (ref 70–99)
Glucose-Capillary: 156 mg/dL — ABNORMAL HIGH (ref 70–99)
Glucose-Capillary: 129 mg/dL — ABNORMAL HIGH (ref 70–99)

## 2021-02-18 MED ORDER — DILTIAZEM HCL 25 MG/5ML IV SOLN
5.0000 mg | Freq: Once | INTRAVENOUS | Status: AC
  Administered 2021-02-18: 5 mg via INTRAVENOUS
  Filled 2021-02-18: qty 5

## 2021-02-18 MED ORDER — SODIUM CHLORIDE 0.9 % IV SOLN: INTRAVENOUS | Status: DC

## 2021-02-18 MED ORDER — ENSURE ENLIVE PO LIQD: 237.0000 mL | Freq: Two times a day (BID) | ORAL | Status: DC

## 2021-02-18 NOTE — Progress Notes (Addendum)
Critical care note:  Date of note: 02/18/2021  Subjective: The patient had a sudden worsening of her respiratory distress with respiratory rate to the 40s and desating with pulse oximetry that came down to 73% on 4 L of O2 when is a cannula. She got pale and clammy. No reported fever or chills. No nausea or vomiting. She complained of chest pain earlier but denied it during my interview . No palpitations though she was tachycardic to the 140s. Her left chest tube was clamped yesterday.  Objective: Physical examination: Vital signs: Blood pressure was 159/97 and later 124/77 heart rate 1 42-1 50 with respiratory rate of 44 and temperature was 98.5 earlier Generally: Acutely ill elderly Caucasian female on nonrebreather in mild respiratory distress with increased work of breathing and tachypnea.She was alert and cooperative. HEENT: AT/[place] , PERRLA, EOMI. OP: Moist MM and tongue. NR is in place. Neck: Supple with no JVD. CV: RRR with tachycardia to 140, no M/G/R. Lungs : diminished Left basal and middling zone breath sounds.  Left chest tube in place. Abdomen: Soft, NT with pis. BS. Extremities: no edema, C/C. Neurologic: Grossly nonfocal.  Labs and radiographic studies reviewed.  Stat EKG : Sinus tachycardia with a rate of 144 with LAD. -CBC showed leukocytosis of 15.7 and thrombocytosis of 452 with hemoglobin 11.9 hematocrit 35.5.  Stat portable chest x-ray showed interval development of a left laterally loculated pneumothorax demonstrating some degree of tension physiology with mediastinal shift to the right and left pigtail chest tube in unchanged position within the pleural space and correlating with appropriate function of the chest tube was recommended.  It showed stable left apical cavitary lesion and stable dense consolidation of the left lung with multifocal pulmonary infiltrates within the right mid and upper lung zone likely infectious.  Assessment/plan: 1.  Acute on chronic hypoxic  respiratory failure secondary to developing left lateral loculated pneumothorax likely secondary to clamping of chest tube with possible tension element. -The patient's chest tube was immediately placed to low suction. -She was placed on nonrebreather.  Her pulse oximetry improved to 99% however she remained tachycardic and tachypneic. -She was given 5 mg of IV Cardizem and repeat her chest x-ray after placing her chest tube to low suction revealed improved aeration. -CT surgery on the case were notified and will follow on chest tube position and function. -PCCM are following the patient as well for pulmonary cavitary lesion, rule out TB.  2.  Bilateral airspace disease with dense consolidation and thick-walled cavities with differential diagnosis including atypical pneumonia, fungal pneumonia and TB. -We will continue current plan of care.  3.  Uncontrolled type 2 diabetes mellitus, hypothyroidism and hyponatremia. -We will continue current plan of care.  Authorized and performed by: Valente David, MD Total critical care time: Approximately :40  minutes. Due to a high probability of clinically significant, life-threatening deterioration, the patient required my highest level of preparedness to intervene emergently and I personally spent this critical care time directly and personally managing the patient.  This critical care time included obtaining a history, examining the patient, pulse oximetry, ordering and review of studies, arranging urgent treatment with development of management plan, evaluation of patient's response to treatment, frequent reassessment, and discussions with other providers. This critical care time was performed to assess and manage the high probability of imminent, life-threatening deterioration that could result in multiorgan failure.  It was exclusive of separately billable procedures and treating other patients and teaching time.  Please see MDM section and  the rest of the  note for further information on patient assessment and treatment.

## 2021-02-18 NOTE — Progress Notes (Signed)
PT Cancellation Note  Patient Details Name: [name] [name] MRN: [id number] DOB: 17-Jul-1956   Cancelled Treatment:    Reason Eval/Treat Not Completed: (P) Medical issues which prohibited therapy;Other (comment) (defer 9 AM due to recent rapid response -pt still with elevated HR and RR.) Did not have time in PM for second attempt. Will continue efforts next date per PT POC.  Carly M Poff 02/18/2021, 5:59 PM* delayed entry

## 2021-02-18 NOTE — Significant Event (Signed)
Rapid Response Event Note   Reason for Call :  SpO2-70s on 4L [place], respiratory distress, tachycardia  Initial Focused Assessment:  Pt lying in bed with eyes open, +WOB, +accessory muscle use. She is alert and oriented, c/o difficulty breathing. Lungs rhonchi on R and little air movement on L. Skin cool, clammy, pale.   T-98.5, HR-150, BP-159/97, RR-55, SpO2-92% on NRB  CT unclamped-SpO2 increased to 98%>PCXR done showing ptx with shift>CT placed to sx.  Pt SpO2 increased to 100% on NRB, HR still 150s and RR-40s. Dr Laneta Simmers notified and additional PCXR done    Interventions:  CT unclamped-SpO2 increased to 98% on NRB PCXR-L lat loculated ptx with mediastinal shift, stable L apical cavitary lesion, stable dense consolidation of L lung and multifocal pulmonary infiltrate within the R mid and upper lung zone. L pigtail chest tube position with the pleural space. CT placed to sx with 3 chamber air leak then drainage of serous fluid EKG-ST  Dr. Laneta Simmers notified and additional PCXR ordered>L basilar loculated ptx, slight alleviation of tension physiology with slight expansion of the collapsed L lung  Plan of Care:  Dr. Laneta Simmers to look at 2nd [facility] results.  Await orders. Continue to monitor pt closely. Call RRT if further assistance needed.    Event Summary:   MD Notified: Dr. Arville Care notified by bedside RN and came to beside, Dr. Laneta Simmers notified by myself at 0414 Call Time:0323 Arrival Time:0326 End SWNI:[id number]  Terrilyn Saver, RN

## 2021-02-18 NOTE — Progress Notes (Addendum)
      [address]       [place] [address]             [phone]       2 Days Post-Op Procedure(s) (LRB): VIDEO BRONCHOSCOPY WITH FLUORO (Left) BRONCHIAL BIOPSIES BRONCHIAL BRUSHINGS BRONCHIAL WASHINGS  Subjective: Events of early morning hours noted. Had tachypnea, tachycardia and desaturation. CXR showed left PTX with some mediastinal shift. Left pigtail catheter was placed to suction and she was placed on a non re breather mask with improvement in symptoms.HR improved with IV Cardizem.  Currently resting in bed and says she feels better.   Objective: Vital signs in last 24 hours: Temp:  [97.7 F (36.5 C)-100.9 F (38.3 C)] 97.7 F (36.5 C) (03/09 0800) Pulse Rate:  [98-155] 112 (03/09 0800) Cardiac Rhythm: Supraventricular tachycardia (03/09 0319) Resp:  [18-50] 28 (03/09 0800) BP: (109-159)/(63-97) 130/80 (03/09 0800) SpO2:  [73 %-100 %] 100 % (03/09 0800) Weight:  [44.7 kg] 44.7 kg (03/09 0616)      Intake/Output from previous day: 03/08 0701 - 03/09 0700 In: 240 [P.O.:240] Out: 1450 [Urine:1450]   Physical Exam:  Cardiovascular: HR 112 Pulmonary: Clear to auscultation on right and diminished on left  Abdomen: Soft, non tender Extremities: Mild bilateral lower extremity edema. Wounds: Clean and dry.  No erythema or signs of infection. Chest Tube: to suction, no air leak  Lab Results: CBC: Recent Labs    02/17/21 0432 02/18/21 0353  WBC 10.7* 15.7*  HGB 11.3* 11.9*  HCT 35.3* 35.5*  PLT 457* 452*   BMET:  Recent Labs    02/17/21 0432 02/18/21 0353  NA 130* 128*  K 4.5 4.3  CL 92* 90*  CO2 28 26  GLUCOSE 241* 278*  BUN 6* 7*  CREATININE 0.39* 0.43*  CALCIUM 8.7* 8.5*    PT/INR: No results for input(s): LABPROT, INR in the last 72 hours. ABG:  INR: Will add last result for INR, ABG once components are confirmed Will add last 4 CBG results once components are confirmed  Assessment/Plan:   -65yo former smoker with  15kg Wt loss over the past 3 months admitted with a left tension PTX that has resolved with insertion of a left pigtail pleural tube by the ED physician.  Follow up CT scan shows the left upper lobe and superior segment of the left lower lobe are replace with a thick-walled cavity.  Additionally, there are multiple solid nodules in the right upper lobe.  Pulmonary medicine consulted and bronchoscopy was done on 3/7. Purulent secretions noted in RUL bronchus.  Brushings, biopsies, and cultures obtained.  AFB smear negative, other studies pending.    -Recurrent PTX early this morning with tension physiology. Sx improved with placing CT back to suction and increase in FiO2.  No active air leak observed and the tube appears to be functioning appropriately now. Will repeat the CXR to see if there has been further expansion.   Plan to leave the CT to suction for now.  Cecille Amsterdam RoddenberryPA-C 02/18/2021,8:50 AM [phone]  I have seen and examined the patient and agree with the assessment and plan as outlined.  Await f/u CXR to make certain left lung has adequately reexpanded but clinically patient is doing much better.  Purcell Nails, MD 02/18/2021 9:15 AM

## 2021-02-18 NOTE — Progress Notes (Signed)
Nutrition Follow-up  DOCUMENTATION CODES:   Non-severe (moderate) malnutrition in context of chronic illness,Underweight  INTERVENTION:   -D/c Ensure Enlive po BID, each supplement provides 350 kcal and 20 grams of protein -Continue MVI with minerals daily -Continue [place] yogurt with meals -Continue liberalized diet to regular  NUTRITION DIAGNOSIS:   Moderate Malnutrition related to chronic illness (DM) as evidenced by energy intake < or equal to 75% for > or equal to 1 month,mild fat depletion,moderate fat depletion,mild muscle depletion,moderate muscle depletion.  Ongoing  GOAL:   Patient will meet greater than or equal to 90% of their needs  Progressing   MONITOR:   PO intake,Supplement acceptance,Labs,Weight trends,Skin,I & O's  REASON FOR ASSESSMENT:   Consult Assessment of nutrition requirement/status  ASSESSMENT:   [name] [name] is a 65 y.o. female with a PMH significant for type 2 diabetes, recent vertebral fracture, tobacco use, hyperlipidemia, hypothyroidism.  3/3- chest tube placed 3/7- s/p Flexible bronchoscopy with bronchial alveolar lavage (20802) and Transbronchial lung biopsy, single lobe  Reviewed I/O's: -1.2 L x 24 hours and -4.6L since admission  UOP: 1.5 L x 24 hours  Pt sleeping soundly at time of visit. RD did not disturb.   Pt with improved oral intake. Noted meal completion 50%. Pt is a very selective eater and is on a liberalized diet. She continues to refuse nutritional supplements. Pt husband also brings in outside food for pt.   Medications reviewed and include 0.9% sodium chloride infusion.   Labs reviewed: CBGS: [id number] (inpatient orders for glycemic control are 0-5 units insulin aspart daily at bedtime and 0-9 units insulin aspart TID with meals). Pt is also refusing insulin.   Diet Order:   Diet Order            Diet regular Room service appropriate? Yes; Fluid consistency: Thin  Diet effective now                  EDUCATION NEEDS:   Education needs have been addressed  Skin:  Skin Assessment: Reviewed RN Assessment  Last BM:  02/18/21  Height:   Ht Readings from Last 1 Encounters:  02/12/21 _0  (1.676 m)    Weight:   Wt Readings from Last 1 Encounters:  02/18/21 44.7 kg    Ideal Body Weight:  59.1 kg  BMI:  Body mass index is 15.9 kg/m.  Estimated Nutritional Needs:   Kcal:  1800-2000  Protein:  100-115 grams  Fluid:  > 1.8 L    [name], RD, LDN, [place] Registered Dietitian II Certified Diabetes Care and Education Specialist Please refer to [facility] for RD and/or RD on-call/weekend/after hours pager

## 2021-02-18 NOTE — Progress Notes (Signed)
   02/18/21 0300  Assess: MEWS Score  Temp 98.5 F (36.9 C)  BP (!) 159/97  Pulse Rate (!) 155  ECG Heart Rate (!) 155  Resp (!) 33  SpO2 (!) 73 %  O2 Device Nasal Cannula  O2 Flow Rate (L/min) 4 L/min  Assess: MEWS Score  MEWS Temp 0  MEWS Systolic 0  MEWS Pulse 3  MEWS RR 2  MEWS LOC 0  MEWS Score 5  MEWS Score Color Red  Assess: if the MEWS score is Yellow or Red  Were vital signs taken at a resting state? No  Focused Assessment Change from prior assessment (see assessment flowsheet)  Early Detection of Sepsis Score *See Row Information* High  MEWS guidelines implemented *See Row Information* Yes  Treat  MEWS Interventions Escalated (See documentation below);Consulted Respiratory Therapy;Administered prn meds/treatments  Pain Scale 0-10  Take Vital Signs  Increase Vital Sign Frequency  Red: Q 1hr X 4 then Q 4hr X 4, if remains red, continue Q 4hrs  Escalate  MEWS: Escalate Red: discuss with charge nurse/RN and provider, consider discussing with RRT  Notify: Charge Nurse/RN  Name of Charge Nurse/RN Notified [place], RN  Date Charge Nurse/RN Notified 02/18/21  Time Charge Nurse/RN Notified 0315  Notify: Provider  Provider Name/Title Dr Arville Care  Date Provider Notified 02/18/21  Time Provider Notified [phone]  Notification Type Page  Notification Reason Change in status  Provider response At bedside  Date of Provider Response 02/18/21  Time of Provider Response 0400  Notify: Rapid Response  Name of Rapid Response RN Notified Mandy, RN  Date Rapid Response Notified 02/18/21  Time Rapid Response Notified 0320  Document  Patient Outcome Stabilized after interventions  Progress note created (see row info) Yes   Entered room to find patient ambulating to bathroom.  Pale, tachypneic, with HR in 150s.   After helping patient back to bed found O2 sats to be in mid 70s.  RT paged and Rapid response called.   Stat CXR and EKG performed, EKG showed Sinus Tachycardia.  CXR showed  worsening pneumothorax on L side.  Patient placed back on suction and cardizem 5mg  given to help with rate control.  Will continue to monitor.

## 2021-02-18 NOTE — Plan of Care (Signed)
  Problem: Activity: Goal: Risk for activity intolerance will decrease Outcome: Progressing   Problem: Nutrition: Goal: Adequate nutrition will be maintained Outcome: Progressing   

## 2021-02-18 NOTE — Progress Notes (Signed)
PROGRESS NOTE    [name] [name]  ZJQ:[id number] DOB: July 13, 1956 DOA: 02/12/2021 PCP: Truett Perna, MD    Brief Narrative:  [name] [name] was admitted to the hospital working diagnosis of left tension spontaneous pneumothorax.  65 year old female past medical history for type 2 diabetes mellitus, recent vertebral fracture, tobacco abuse, dyslipidemia and hypothyroidism.  As an outpatient she was diagnosed with pneumothorax.  In the ED she was found to have a 90% left tension hydropneumothorax with severe compression atelectasis of the lung medially and marked mediastinal shift.  She had a left chest tube placed with good toleration. Her blood pressure was 146/81, heart rate 94, respirate 23, oxygen saturation 98%.  She had increased work of breathing, left-sided chest tube in place with decreased breath sounds bilaterally.  Heart S1-S2, present rhythmic, soft abdomen, no lower extremity edema.  Follow-up CT chest showed cavitary lesion.  She was placed on broad-spectrum antibiotic therapy.  Underwent bronchoscopy showing significant amount of purulent secretions on the left side.  She is on respiratory isolation, possible TB.  Not able to clamp chest tube due to recurrent pneumothorax.   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 Left tension spontaneous hydro-pneumothorax/ community aquired/ atypical pneumonia complicated with severe sepsis (end-organ failure hypoxemia) and acute hypoxemic respiratory failure.  Patient is on 15 L. Non rebreathing mask, with increased work of breathing, respiratory rate 34 and use of accessory muscles. Positive tachycardia.   Follow up chest film personally reviewed with expansion left lung,  positive cavitary lesion at the apex. Chest tube in place, no air leak.  Worsening leukocytosis 15,7. Sputum cultures continue with no growth, acid fast pending result.  HIV non reactive.   Continue chest tube to suction.  Antibiotic therapy with Augmenting.  Order heated high flow nasal cannula due to worsening work of breathing, will avoid non invasive mechanical ventilation due to pneumothorax.  Follow chest film in am. Check with micro for PJP  2. Hyponatremia. Persistent hyponatremia with serum Na 128, with K at 4,3 and serum bicarbonate at 26. Stable renal function with serum cr at 0,43.  Clinically hypovolemic, with poor oral intake.   Add gentle IV fluids with isotonic saline.   3. T2DM/ dyslpidemia. Uncontrolled with hyperglycemia, fasting glucose 278, capillary 188, 145, 290, 210. Patient with poor oral intake, continue with sliding scale for glucose cover and monitoring. Hold on glimepiride for now due to poor oral intake.   Continue with atorvastatin.   4. Hypothyroid Continue levothyroxine   5. Moderate calorie protein malnutrition. Continue with nutritional supplements.  Consult nutrition for recommendations.   6. Thoracic vertebral compression fracture. Patient with no pain, will need mobility, when more stable.  Continue with as needed morphine and tramadol.   Patient continue to be at high risk for worsening respiratory failure   Status is: Inpatient  Remains inpatient appropriate because:IV treatments appropriate due to intensity of illness or inability to take PO   Dispo: The patient is from: Home              Anticipated d/c is to: Home              Patient currently is not medically stable to d/c.   Difficult to  place patient No   DVT prophylaxis: Enoxaparin   Code Status:   full  Family Communication:  No family at the bedside      Nutrition Status: Nutrition Problem: Moderate Malnutrition Etiology: chronic illness (DM) Signs/Symptoms: energy  intake < or equal to 75% for > or equal to 1 month,mild fat depletion,moderate fat depletion,mild muscle depletion,moderate muscle depletion Interventions: MVI,Liberalize Diet      Consultants:   ID  Pulmonary   CT surgery   Procedures:   Left chest tube  Bronchoscopy   Antimicrobials:   Augmentin     Subjective: Patient with persistent dyspnea, moderate in intensity, associated with palpitations, no nausea or vomiting, not able to eat due to dyspnea and full face mask.   Objective: Vitals:   02/18/21 0650 02/18/21 0655 02/18/21 0800 02/18/21 1200  BP:   130/80 130/81  Pulse: (!) 115 (!) 116 (!) 112 (!) 107  Resp: (!) 35 (!) 33 (!) 28 (!) 34  Temp:   97.7 F (36.5 C) 98 F (36.7 C)  TempSrc:   Oral Oral  SpO2: 100% 100% 100% 100%  Weight:      Height:        Intake/Output Summary (Last 24 hours) at 02/18/2021 1243 Last data filed at 02/18/2021 1030 Gross per 24 hour  Intake --  Output 1460 ml  Net -1460 ml   Filed Weights   02/16/21 0000 02/17/21 0415 02/18/21 0616  Weight: 45 kg 45.6 kg 44.7 kg    Examination:   General: positive dyspnea at rest, very weak and deconditioned,  Neurology: somnolent but easy to arouse.  E ENT: positive pallor, no icterus, oral mucosa dry.  Cardiovascular: No JVD. S1-S2 present, rhythmic, no gallops, rubs, or murmurs. No lower extremity edema. Pulmonary: positive breath sounds bilaterally, no wheezing or rhonchi, positive bilateral diffuse rales, with decreased ventilation at the left apex. Gastrointestinal. Abdomen soft and non tender Skin. No rashes Musculoskeletal: no joint deformities     Data Reviewed: I have personally reviewed following labs and imaging studies  CBC: Recent Labs  Lab 02/12/21 1336 02/14/21 0444 02/15/21 0327 02/16/21 0459 02/17/21 0432 02/18/21 0353  WBC 9.3 16.4* 13.5* 10.6* 10.7* 15.7*  NEUTROABS 6.8  --   --   --   --   --   HGB 12.4 10.9* 10.7* 11.1* 11.3* 11.9*  HCT 38.8 31.6*  33.0* 34.4* 35.3* 35.5*  MCV 83.4 78.6* 80.5 80.0 81.1 79.6*  PLT 578* 422* 443* 459* 457* 452*   Basic Metabolic Panel: Recent Labs  Lab 02/14/21 0444 02/15/21 0327 02/16/21 0459 02/17/21 0432 02/18/21 0353  NA 125* 128* 129* 130* 128*  K 3.6 3.7 3.8 4.5 4.3  CL 91* 91* 92* 92* 90*  CO2 24 27 28 28 26   GLUCOSE 176* 137* 233* 241* 278*  BUN 9 9 8  6* 7*  CREATININE 0.41* <0.30* 0.31* 0.39* 0.43*  CALCIUM 8.3* 8.4* 8.5* 8.7* 8.5*   GFR: Estimated Creatinine Clearance: 51.5 mL/min (A) (by C-G formula based on SCr of 0.43 mg/dL (L)). Liver Function Tests: No results for input(s): AST, ALT, ALKPHOS, BILITOT, PROT, ALBUMIN in the last 168 hours. No results for input(s): LIPASE, AMYLASE in the last 168 hours. No results for input(s): AMMONIA in the last 168 hours. Coagulation Profile: No results for input(s): INR, PROTIME in the last 168 hours. Cardiac Enzymes: No results for input(s): CKTOTAL, CKMB, CKMBINDEX, TROPONINI in the last 168 hours. BNP (last 3 results) No results for input(s): PROBNP in the  last 8760 hours. HbA1C: No results for input(s): HGBA1C in the last 72 hours. CBG: Recent Labs  Lab 02/17/21 1106 02/17/21 1600 02/17/21 2112 02/18/21 0618 02/18/21 1206  GLUCAP 264* 188* 145* 290* 210*   Lipid Profile: No results for input(s): CHOL, HDL, LDLCALC, TRIG, CHOLHDL, LDLDIRECT in the last 72 hours. Thyroid Function Tests: No results for input(s): TSH, T4TOTAL, FREET4, T3FREE, THYROIDAB in the last 72 hours. Anemia Panel: No results for input(s): VITAMINB12, FOLATE, FERRITIN, TIBC, IRON, RETICCTPCT in the last 72 hours.    Radiology Studies: I have reviewed all of the imaging during this hospital visit personally     Scheduled Meds: . amoxicillin-clavulanate  1 tablet Oral Q12H  . atorvastatin  10 mg Oral QPM  . calcitonin (salmon)  1 spray Alternating Nares Daily  . cholecalciferol  1,000 Units Oral Daily  . glimepiride  2 mg Oral Q breakfast  .  insulin aspart  0-5 Units Subcutaneous QHS  . insulin aspart  0-9 Units Subcutaneous TID WC  . levothyroxine  50 mcg Oral QAC breakfast  . lidocaine  1 patch Transdermal Q24H   Continuous Infusions:   LOS: 6 days        Dustyn Dansereau Annett Gula, MD

## 2021-02-18 NOTE — Plan of Care (Signed)
  Problem: Clinical Measurements: Goal: Ability to maintain clinical measurements within normal limits will improve 02/18/2021 1137 by Isla Pence, RN Outcome: Progressing 02/18/2021 1121 by Isla Pence, RN Outcome: Progressing Goal: Will remain free from infection 02/18/2021 1137 by Isla Pence, RN Outcome: Progressing 02/18/2021 1121 by Isla Pence, RN Outcome: Progressing Goal: Diagnostic test results will improve 02/18/2021 1137 by Isla Pence, RN Outcome: Progressing 02/18/2021 1121 by Isla Pence, RN Outcome: Progressing Goal: Respiratory complications will improve 02/18/2021 1137 by Isla Pence, RN Outcome: Progressing 02/18/2021 1121 by Isla Pence, RN Outcome: Progressing Goal: Cardiovascular complication will be avoided 02/18/2021 1137 by Isla Pence, RN Outcome: Progressing 02/18/2021 1121 by Isla Pence, RN Outcome: Progressing   Problem: Nutrition: Goal: Adequate nutrition will be maintained 02/18/2021 1137 by Isla Pence, RN Outcome: Progressing 02/18/2021 1121 by Isla Pence, RN Outcome: Progressing   Problem: Activity: Goal: Risk for activity intolerance will decrease 02/18/2021 1137 by Isla Pence, RN Outcome: Progressing 02/18/2021 1121 by Isla Pence, RN Outcome: Progressing

## 2021-02-18 NOTE — Progress Notes (Addendum)
[name] for Infectious Disease  Date of Admission:  02/12/2021      Lines:  Peripheral iv's  Abx: 3/08-c amoxicillin-clavulonate                                                            Assessment: Sepsis Upper lobe Cavitary chest lesion left side; bilateral airspace opacity L>>R Left pneumothorax s/p tube thoracostomy 3/03 Thoracic mid back pain  65 yo female [place] smoker, immigrant admitted with 3 weeks at least progressive cough, sob, found to have fever and chest ct left cavitary lesions/bilateral air-space opacity   ddx agree tb/endemic fungi vs malignancy. 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 The chest ct (albeit poor resolution) doesn't visualize any osseous pyogenic concern so far   ----------- 3/09 assessment Increased o2 requirement last night requiring chest tube placed to suction. Not much output. 2-->4Liters supplemental o2. Fever broke today but likely too early to tell augmentin started 3/08 for concerned superimposed bacterial infection of the cavitary lung lesions  Plan: 1. F/u BAL tb cx and pathology, and rifampin/mtb pcr 2. Continue amox-clav 3. 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 amoxicillin-clavulanate  1 tablet Oral Q12H  . atorvastatin  10 mg Oral QPM  . calcitonin (salmon)  1 spray Alternating Nares Daily  . cholecalciferol  1,000 Units Oral Daily  . insulin aspart  0-5 Units Subcutaneous QHS  . insulin aspart  0-9 Units Subcutaneous TID WC  . levothyroxine  50 mcg Oral QAC breakfast  . lidocaine  1 patch Transdermal Q24H   Continuous Infusions: . sodium chloride 75 mL/hr at 02/18/21 1410   PRN Meds:.acetaminophen **OR** acetaminophen, morphine injection, traMADol   SUBJECTIVE: Feels a little dizzy. Not more sob Reviewed chart noted chest tube management change last night No fever so far this morning No n/v/diarrhea/rash   Review of Systems: ROS Other ros negative  No Known Allergies  OBJECTIVE: Vitals:   02/18/21 0800 02/18/21 1200 02/18/21 1351 02/18/21 1352  BP: 130/80 130/81 119/83 138/81  Pulse: (!) 112 (!) 107 (!) 111 (!) 111  Resp: (!) 28 (!) 34 (!) 32 (!) 34  Temp: 97.7 F (36.5 C) 98 F (36.7 C)  98 F (36.7 C)  TempSrc: Oral Oral    SpO2: 100% 100% 100%   Weight:      Height:       Body mass index is 15.9 kg/m.  Physical Exam Appeared tachypneic, conversant short sentences, no distress Heent: normocephalic; per; conj  clear Neck supple cv rrr no mrg Lungs tachypneic; rhonci right lung, decreased bs left lung; chest tube in placed serous discharge abd s/nt Neuro nonfocal   Lab Results Lab Results  Component Value Date   WBC 15.7 (H) 02/18/2021   HGB 11.9 (L) 02/18/2021   HCT 35.5 (L) 02/18/2021   MCV 79.6 (L) 02/18/2021   PLT 452 (H) 02/18/2021    Lab Results  Component Value Date   CREATININE 0.43 (L) 02/18/2021   BUN 7 (L) 02/18/2021   NA 128 (L) 02/18/2021   K 4.3 02/18/2021   CL 90 (L) 02/18/2021   CO2 26 02/18/2021    Lab Results  Component Value Date   ALT 16 01/22/2021   AST 22 01/22/2021    ALKPHOS 95 01/22/2021   BILITOT 1.1 01/22/2021     Microbiology: Recent Results (from the past 240 hour(s))  Resp Panel by RT-PCR (Flu A&B, Covid) Nasopharyngeal Swab     Status: None   Collection Time: 02/12/21  3:44 PM   Specimen: Nasopharyngeal Swab; Nasopharyngeal(NP) swabs in vial transport medium  Result Value Ref Range Status   SARS Coronavirus 2 by RT PCR NEGATIVE NEGATIVE Final    Comment: (NOTE) SARS-CoV-2 target nucleic acids are NOT DETECTED.  The SARS-CoV-2 RNA is generally detectable in upper respiratory specimens during the acute phase of infection. The lowest concentration of SARS-CoV-2 viral copies this assay can detect is 138 copies/mL. A negative result does not preclude SARS-Cov-2 infection and should not be used as the sole basis for treatment or other patient management decisions. A negative result may occur with  improper specimen collection/handling, submission of specimen other than nasopharyngeal swab, presence of viral mutation(s) within the areas targeted by this assay, and inadequate number of viral copies(<138 copies/mL). A negative result must be combined with clinical observations, patient history, and epidemiological information. The expected result is Negative.  Fact Sheet for Patients:  EntrepreneurPulse.com.au  Fact Sheet for Healthcare Providers:  IncredibleEmployment.be  This test is no t yet approved or cleared by the [place] FDA and  has been authorized for detection and/or diagnosis of SARS-CoV-2 by FDA under an Emergency Use Authorization (EUA). This EUA will remain  in effect (meaning this test can be used) for the duration of the COVID-19 declaration under Section 564(b)(1) of the Act, 21 U.S.C.section 360bbb-3(b)(1), unless the authorization is terminated  or revoked sooner.       Influenza A by PCR NEGATIVE NEGATIVE Final   Influenza B by PCR NEGATIVE NEGATIVE Final    Comment:  (NOTE) The Xpert Xpress SARS-CoV-2/FLU/RSV plus assay is intended as an aid in the diagnosis of influenza from Nasopharyngeal swab specimens and should not be used as a sole basis for treatment. Nasal washings and aspirates are unacceptable for Xpert Xpress SARS-CoV-2/FLU/RSV testing.  Fact Sheet for Patients: EntrepreneurPulse.com.au  Fact Sheet for Healthcare Providers: IncredibleEmployment.be  This test is not yet approved or cleared by the [place] FDA and has been authorized for detection and/or diagnosis of SARS-CoV-2 by FDA under an Emergency Use Authorization (EUA). This EUA will remain in effect (meaning this test can be used) for the duration of the COVID-19 declaration under Section 564(b)(1) of the Act, 21 U.S.C. section 360bbb-3(b)(1), unless the authorization is terminated or revoked.  Performed at [facility], [place] [address], [place], [place] [address]   Culture, blood (routine x 2)     Status: None   Collection Time: 02/13/21  2:25 PM   Specimen:  BLOOD  Result Value Ref Range Status   Specimen Description BLOOD LEFT ANTECUBITAL  Final   Special Requests   Final    BOTTLES DRAWN AEROBIC AND ANAEROBIC Blood Culture adequate volume   Culture   Final    NO GROWTH 5 DAYS Performed at [facility], [address], [place], [place] [address]    Report Status 02/18/2021 FINAL  Final  Culture, blood (routine x 2)     Status: None   Collection Time: 02/13/21  2:29 PM   Specimen: BLOOD  Result Value Ref Range Status   Specimen Description BLOOD RIGHT ANTECUBITAL  Final   Special Requests   Final    BOTTLES DRAWN AEROBIC AND ANAEROBIC Blood Culture adequate volume   Culture   Final    NO GROWTH 5 DAYS Performed at [facility], [place] [address], [place], [place] [address]    Report Status 02/18/2021 FINAL  Final  Expectorated Sputum Assessment w Gram Stain, Rflx to Resp Cult     Status: None   Collection  Time: 02/13/21  3:13 PM   Specimen: Expectorated Sputum  Result Value Ref Range Status   Specimen Description EXPECTORATED SPUTUM  Final   Special Requests Normal  Final   Sputum evaluation   Final    THIS SPECIMEN IS ACCEPTABLE FOR SPUTUM CULTURE Performed at [facility], [place] [address], [place], [place] [address]    Report Status 02/14/2021 FINAL  Final  Culture, Respiratory w Gram Stain     Status: None   Collection Time: 02/13/21  3:13 PM  Result Value Ref Range Status   Specimen Description EXPECTORATED SPUTUM  Final   Special Requests Normal Reflexed from [id number]  Final   Gram Stain   Final    MODERATE WBC PRESENT,BOTH PMN AND MONONUCLEAR FEW GRAM POSITIVE COCCI RARE GRAM VARIABLE ROD    Culture   Final    MODERATE Normal respiratory flora-no Staph aureus or Pseudomonas seen Performed at [facility], [place] [address] [place], [place] [address]    Report Status 02/16/2021 FINAL  Final  Culture, Urine     Status: Abnormal   Collection Time: 02/13/21 10:03 PM   Specimen: Urine, Clean Catch  Result Value Ref Range Status   Specimen Description URINE, CLEAN CATCH  Final   Special Requests NONE  Final   Culture (A)  Final    <10,000 COLONIES/mL INSIGNIFICANT GROWTH Performed at [facility], [place] [address], [place], [place] [address]    Report Status 02/15/2021 FINAL  Final  Culture, respiratory     Status: None (Preliminary result)   Collection Time: 02/16/21 12:12 PM   Specimen: Bronchoalveolar Lavage; Respiratory  Result Value Ref Range Status   Specimen Description BRONCHIAL ALVEOLAR LAVAGE  Final   Special Requests NONE  Final   Gram Stain   Final    FEW WBC PRESENT, PREDOMINANTLY PMN NO ORGANISMS SEEN    Culture   Final    NO GROWTH 2 DAYS Performed at [facility], [address] [place],  [address]    Report Status PENDING  Incomplete  Acid Fast Smear (AFB)     Status: None   Collection Time: 02/16/21 12:12 PM   Specimen:  Bronchial Alveolar Lavage  Result Value Ref Range Status   AFB Specimen Processing Concentration  Final   Acid Fast Smear Negative  Final    Comment: (NOTE) Performed At: [facility] [place] [address]  [address] [place], [place] [id number] Rush Farmer MD [id number]    Source (AFB) BRONCHIAL ALVEOLAR LAVAGE  Final    Comment: LUL Performed at [facility], [place] [address] [place], [place] [address]   Culture, respiratory     Status: None (Preliminary result)   Collection Time: 02/16/21 12:48 PM   Specimen: Bronchoalveolar Lavage; Respiratory  Result Value Ref Range Status   Specimen Description BRONCHIAL ALVEOLAR LAVAGE  Final   Special Requests LINGULA  Final   Gram Stain   Final    RARE WBC PRESENT, PREDOMINANTLY PMN NO ORGANISMS SEEN    Culture   Final    NO GROWTH 2 DAYS Performed at [facility], [address] [place], [place] [address]    Report Status PENDING  Incomplete  Acid Fast Smear (AFB)     Status: None   Collection Time: 02/16/21 12:48 PM   Specimen: Bronchial Alveolar Lavage  Result Value Ref Range Status   AFB Specimen Processing Concentration  Final   Acid Fast Smear Negative  Final    Comment: (NOTE) Performed At: [facility] [place], [place] [id number] Rush Farmer MD [id number]    Source (AFB) BRONCHIAL ALVEOLAR LAVAGE  Final    Comment: LINGULA Performed at [facility], [place] [address], [place], [place] [address]     Serology: Serology: hiv negative Cryptococcal ag negative Pending endemic fungi serology 3/07 & 3/08 rifampin/mtb pcr pending  Micro: 3/07 BAL fluid LUL  afb stain negative; cx in progress Fungal cx in progress Respiratory culture ngtd Wbc 6606 >30% neutrophilic  Path: 1/60 BAL LUL cytology/biopsy in progress  Imaging: If present, new imagings (plain films, ct scans, and mri) have been personally visualized and interpreted; radiology reports have been reviewed.  Decision making incorporated into the Impression / Recommendations.  3/09 cxr Chest tube remains on the left with interval resolution of left lateral and basilar pneumothorax. Cavitation left upper lobe persists with consolidation throughout much of the left mid and lower lung regions, stable. Equivocal left pleural effusion. Patchy infiltrate on the right stable without progression. Stable cardiac silhouette  3/07 cxr Indwelling left chest pigtail No pneumothorax status post bronchoscopy. Otherwise unchanged  3/03 chest ct 1. Bilateral areas of airspace disease, much more pronounced throughout the left lung with areas of dense consolidation and thick-walled cavities as above. Overall, findings favor atypical infection such as fungal pneumonia or even tuberculosis. Sputum analysis may be useful. Cavitating malignancy cannot be completely excluded, though the widespread disease would be atypical. 2. Multiple subcentimeter solid nodules within the right upper lobe, largest measuring 9 mm. 3. Trace residual left pneumothorax with indwelling left chest tube as above. 4. Trace left pleural effusion. 5. No evidence of pulmonary embolus. 6. Aortic Atherosclerosis  01/22/21 ct lumbar spine 1. Acute biconvex compression deformity of the T12 vertebral body with acute transcortical lucency through the superior and inferior endplates as well as the anterior and posterior cortices technically qualifying this as a burst fracture (AO spine A4) albeit with only minimal 2 mm of retropulsion of the superior endplate of approximately 2 mm without resulting canal stenosis. No clear extension into the posterior tension band. Mild paravertebral swelling and thickening adjacent the fracture line compatible with an acute injury. Intervertebral gas likely related to intravasation of the degenerative nitrogenous gas phenomenon seen within the adjacent disc spaces. 2. Discogenic and facet  degenerative changes throughout the lumbar spine as described above. 3. Aortic Atherosclerosis   Johnny Bridge  Darnelle Maffucci, [place] [facility] [place] [phone] pager    02/18/2021, 5:07 PM

## 2021-02-18 NOTE — Progress Notes (Signed)
NAMELesleigh [name], MRN:  [id number], DOB:  [birth year], LOS: 6 ADMISSION DATE:  02/12/2021, CONSULTATION DATE:  02/13/2021 REFERRING MD:  Nunzio Cory MD, CHIEF COMPLAINT: Abnormal CT scan, rule out TB  Brief History:   65 year old [name] [name] immigrant with diabetes, vertebral fracture, tobacco use.  Presenting with large left hydropneumothorax s/p chest tube placement in the ED. CT shows airspace consolidation, cavitary lesions left greater than right.  She is febrile today prompting placement in airborne isolation due to concern for TB and PCCM consultation for possible bronchoscopy Complains of chronic cough, occasional sputum production.  Past Medical History:   Diabetes, hyperlipidemia, thyroid disease  Social history notable for 15-pack-year smoker.  Quit in 2020.  She denies exposure to TB, no being homeless or incarcerated. She is an immigrant from [place] over 20 years ago but still travels back every year.  Used to work as a Chief Technology Officer.  Denies any significant occupational or home exposures  She has about 15 kg weight loss over 3 months which she attributes to her recent fall and vertebral compression fracture, no loss of appetite.  Denies hemoptysis.  Significant Hospital Events:   3/3-admit with pneumothorax, chest tube placement on the left  Consults:   Cardiothoracic surgery, PCCM  Procedures:    Significant Diagnostic Tests:   CTA 02/12/2021-bilateral airspace disease with dense consolidation, cavitary lesions of the left, dense airspace consolidation of the lower lobe, multiple small pulmonary nodules.  Micro Data:    Antimicrobials:    Interim History / Subjective:   Failed chest tube clamping trial as she developed recurrence of the pneumothorax with tension physiology. Chest tube was unclamped and placed to suction with resolution of the pneumothorax.   She is having increased work of breathing today and is being transitioned to high flow nasal  canula.   Objective   Blood pressure 119/83, pulse (!) 111, temperature 98 F (36.7 C), temperature source Oral, resp. rate (!) 32, height 5\' 6"  (1.676 m), weight 44.7 kg, SpO2 100 %.    FiO2 (%):  [50 %] 50 %   Intake/Output Summary (Last 24 hours) at 02/18/2021 1401 Last data filed at 02/18/2021 1030 Gross per 24 hour  Intake --  Output 1460 ml  Net -1460 ml   Filed Weights   02/16/21 0000 02/17/21 0415 02/18/21 0616  Weight: 45 kg 45.6 kg 44.7 kg    Examination: Blood pressure 129/73, pulse 99, temperature (!) 101.4 F (38.6 C), temperature source Oral, resp. rate 19, height 5\' 6"  (1.676 m), weight 44.7 kg, SpO2 97 %. Gen:      Increased work of breathing, thin appearing HEENT:  PERRL, sclera anicteric Lungs:    Scattered crackles bilaterally. Left chest tube in place, on suction with intermittent air leak CV:         tachycardic; no murmurs Abd:      + bowel sounds; soft, non-tender; no palpable masses, no distension Ext:    No edema; adequate peripheral perfusion Skin:      Warm and dry; no rash Neuro: alert and oriented x 3  Resolved Hospital Problem list     Assessment & Plan:  Left lung consolidation, cavitation with bilateral airspace disease.  Differential diagnosis includes mycobacterial infection, fungal infection or malignancy given her smoking history Bronchoscopy performed 3/6 Awaiting culture and pathology results. Neutrophil predominant BALs on cell count/differential. No organisms noted on gram stain. Acid fast smears are negative. Quantiferon gold is pending. Cryptococcal ag is negative. Blastomyces ag is pending.  Started on augmentin 3/8  Left pneumothorax s/p chest tube Clamp trial failed on 3/8 Continue chest tube to suction, intermittent air leak today  If she continues to experience increased work of breathing we may need to transfer her to the ICU for further monitoring.   Best practice (evaluated daily)  Per primary  Labs   CBC: Recent Labs   Lab 02/12/21 1336 02/14/21 0444 02/15/21 0327 02/16/21 0459 02/17/21 0432 02/18/21 0353  WBC 9.3 16.4* 13.5* 10.6* 10.7* 15.7*  NEUTROABS 6.8  --   --   --   --   --   HGB 12.4 10.9* 10.7* 11.1* 11.3* 11.9*  HCT 38.8 31.6* 33.0* 34.4* 35.3* 35.5*  MCV 83.4 78.6* 80.5 80.0 81.1 79.6*  PLT 578* 422* 443* 459* 457* 452*    Basic Metabolic Panel: Recent Labs  Lab 02/14/21 0444 02/15/21 0327 02/16/21 0459 02/17/21 0432 02/18/21 0353  NA 125* 128* 129* 130* 128*  K 3.6 3.7 3.8 4.5 4.3  CL 91* 91* 92* 92* 90*  CO2 24 27 28 28 26   GLUCOSE 176* 137* 233* 241* 278*  BUN 9 9 8  6* 7*  CREATININE 0.41* <0.30* 0.31* 0.39* 0.43*  CALCIUM 8.3* 8.4* 8.5* 8.7* 8.5*   GFR: Estimated Creatinine Clearance: 51.5 mL/min (A) (by C-G formula based on SCr of 0.43 mg/dL (L)). Recent Labs  Lab 02/15/21 0327 02/16/21 0459 02/17/21 0432 02/18/21 0353  WBC 13.5* 10.6* 10.7* 15.7*    Liver Function Tests: No results for input(s): AST, ALT, ALKPHOS, BILITOT, PROT, ALBUMIN in the last 168 hours. No results for input(s): LIPASE, AMYLASE in the last 168 hours. No results for input(s): AMMONIA in the last 168 hours.  ABG No results found for: PHART, PCO2ART, PO2ART, HCO3, TCO2, ACIDBASEDEF, O2SAT   Coagulation Profile: No results for input(s): INR, PROTIME in the last 168 hours.  Cardiac Enzymes: No results for input(s): CKTOTAL, CKMB, CKMBINDEX, TROPONINI in the last 168 hours.  HbA1C: Hgb A1c MFr Bld  Date/Time Value Ref Range Status  02/13/2021 03:57 AM 10.8 (H) 4.8 - 5.6 % Final    Comment:    (NOTE) Pre diabetes:          5.7%-6.4%  Diabetes:              >6.4%  Glycemic control for   <7.0% adults with diabetes   03/07/2020 11:03 AM 13.7 (H) 4.6 - 6.5 % Final    Comment:    Glycemic Control Guidelines for People with Diabetes:Non Diabetic:  <6%Goal of Therapy: <7%Additional Action Suggested:  >8%     CBG: Recent Labs  Lab 02/17/21 1106 02/17/21 1600 02/17/21 2112  02/18/21 0618 02/18/21 1206  GLUCAP 264* 188* 145* 290* 210*    Signature:   04/20/21, MD [facility] Office: [phone]  If no response to pager , please call [phone] until 7pm After 7:00 pm call Elink  [phone] 02/18/2021, 2:01 PM

## 2021-02-19 ENCOUNTER — Inpatient Hospital Stay (HOSPITAL_COMMUNITY): Payer: BLUE CROSS/BLUE SHIELD

## 2021-02-19 DIAGNOSIS — J181 Lobar pneumonia, unspecified organism: Secondary | ICD-10-CM | POA: Diagnosis not present

## 2021-02-19 DIAGNOSIS — J984 Other disorders of lung: Secondary | ICD-10-CM | POA: Diagnosis not present

## 2021-02-19 DIAGNOSIS — J939 Pneumothorax, unspecified: Secondary | ICD-10-CM | POA: Diagnosis not present

## 2021-02-19 DIAGNOSIS — E440 Moderate protein-calorie malnutrition: Secondary | ICD-10-CM | POA: Diagnosis not present

## 2021-02-19 DIAGNOSIS — S22080D Wedge compression fracture of T11-T12 vertebra, subsequent encounter for fracture with routine healing: Secondary | ICD-10-CM | POA: Diagnosis not present

## 2021-02-19 DIAGNOSIS — J9311 Primary spontaneous pneumothorax: Secondary | ICD-10-CM

## 2021-02-19 LAB — CBC WITH DIFFERENTIAL/PLATELET
Abs Immature Granulocytes: 0.08 10*3/uL — ABNORMAL HIGH (ref 0.00–0.07)
Basophils Absolute: 0 10*3/uL (ref 0.0–0.1)
Basophils Relative: 0 %
Eosinophils Absolute: 0 10*3/uL (ref 0.0–0.5)
Eosinophils Relative: 0 %
HCT: 32.3 % — ABNORMAL LOW (ref 36.0–46.0)
Hemoglobin: 10.5 g/dL — ABNORMAL LOW (ref 12.0–15.0)
Immature Granulocytes: 1 %
Lymphocytes Relative: 6 %
Lymphs Abs: 0.6 10*3/uL — ABNORMAL LOW (ref 0.7–4.0)
MCH: 26 pg (ref 26.0–34.0)
MCHC: 32.5 g/dL (ref 30.0–36.0)
MCV: 80 fL (ref 80.0–100.0)
Monocytes Absolute: 0.5 10*3/uL (ref 0.1–1.0)
Monocytes Relative: 5 %
Neutro Abs: 8.3 10*3/uL — ABNORMAL HIGH (ref 1.7–7.7)
Neutrophils Relative %: 88 %
Platelets: 394 10*3/uL (ref 150–400)
RBC: 4.04 MIL/uL (ref 3.87–5.11)
RDW: 13.8 % (ref 11.5–15.5)
WBC: 9.5 10*3/uL (ref 4.0–10.5)
nRBC: 0 % (ref 0.0–0.2)

## 2021-02-19 LAB — BASIC METABOLIC PANEL
Anion gap: 7 (ref 5–15)
BUN: 8 mg/dL (ref 8–23)
CO2: 30 mmol/L (ref 22–32)
Calcium: 7.9 mg/dL — ABNORMAL LOW (ref 8.9–10.3)
Chloride: 91 mmol/L — ABNORMAL LOW (ref 98–111)
Creatinine, Ser: 0.4 mg/dL — ABNORMAL LOW (ref 0.44–1.00)
GFR, Estimated: >60 mL/min (ref >60)
Glucose, Bld: 132 mg/dL — ABNORMAL HIGH (ref 70–99)
Potassium: 3.6 mmol/L (ref 3.5–5.1)
Sodium: 128 mmol/L — ABNORMAL LOW (ref 135–145)

## 2021-02-19 LAB — CULTURE, RESPIRATORY W GRAM STAIN
Culture: NO GROWTH
Culture: NO GROWTH

## 2021-02-19 LAB — ACID FAST SMEAR (AFB, MYCOBACTERIA): Acid Fast Smear: POSITIVE — AB

## 2021-02-19 LAB — GLUCOSE, CAPILLARY
Glucose-Capillary: 139 mg/dL — ABNORMAL HIGH (ref 70–99)
Glucose-Capillary: 152 mg/dL — ABNORMAL HIGH (ref 70–99)
Glucose-Capillary: 201 mg/dL — ABNORMAL HIGH (ref 70–99)

## 2021-02-19 LAB — HISTOPLASMA ANTIGEN, URINE: Histoplasma Antigen, urine: <0.5 (ref <0.5)

## 2021-02-19 MED ORDER — IPRATROPIUM-ALBUTEROL 20-100 MCG/ACT IN AERS
1.0000 | INHALATION_SPRAY | Freq: Four times a day (QID) | RESPIRATORY_TRACT | Status: DC
  Administered 2021-02-19 – 2021-02-20 (×3): 1 via RESPIRATORY_TRACT
  Filled 2021-02-19: qty 4

## 2021-02-19 MED ORDER — ALBUTEROL SULFATE HFA 108 (90 BASE) MCG/ACT IN AERS
1.0000 | INHALATION_SPRAY | RESPIRATORY_TRACT | Status: DC | PRN
  Administered 2021-02-19 – 2021-02-23 (×5): 2 via RESPIRATORY_TRACT
  Filled 2021-02-19: qty 6.7

## 2021-02-19 NOTE — Progress Notes (Signed)
NAMESharisse [name], MRN:  [id number], DOB:  July 07, 1956, LOS: 7 ADMISSION DATE:  02/12/2021, CONSULTATION DATE:  02/13/2021 REFERRING MD:  Nunzio Cory MD, CHIEF COMPLAINT: Abnormal CT scan, rule out TB  Brief History:   65 year old [place] immigrant with diabetes, vertebral fracture, tobacco use.  Presenting with large left hydropneumothorax s/p chest tube placement in the ED. CT shows airspace consolidation, cavitary lesions left greater than right.  She is febrile today prompting placement in airborne isolation due to concern for TB and PCCM consultation for possible bronchoscopy Complains of chronic cough, occasional sputum production.  Past Medical History:   Diabetes, hyperlipidemia, thyroid disease  Social history notable for 15-pack-year smoker.  Quit in 2020.  She denies exposure to TB, no being homeless or incarcerated. She is an immigrant from [place] over 20 years ago but still travels back every year.  Used to work as a Chief Technology Officer.  Denies any significant occupational or home exposures  She has about 15 kg weight loss over 3 months which she attributes to her recent fall and vertebral compression fracture, no loss of appetite.  Denies hemoptysis.  Significant Hospital Events:   3/3-admit with pneumothorax, chest tube placement on the left  Consults:   Cardiothoracic surgery, PCCM  Procedures:    Significant Diagnostic Tests:   CTA 02/12/2021-bilateral airspace disease with dense consolidation, cavitary lesions of the left, dense airspace consolidation of the lower lobe, multiple small pulmonary nodules.  Micro Data:    Antimicrobials:    Interim History / Subjective:   No acute events overnight. Chest radiograph this morning showing small basilar pneumothorax on the left. CT surgery has changed the tubing on the chest tube this AM to prevent kinking.   Patient feeling ok this morning without complaints.  Objective   Blood pressure 114/74, pulse 99,  temperature 98.4 F (36.9 C), temperature source Oral, resp. rate (!) 27, height 5\' 6"  (1.676 m), weight 44.9 kg, SpO2 98 %.    FiO2 (%):  [50 %] 50 %   Intake/Output Summary (Last 24 hours) at 02/19/2021 1156 Last data filed at 02/19/2021 1124 Gross per 24 hour  Intake 2387.26 ml  Output 840 ml  Net 1547.26 ml   Filed Weights   02/17/21 0415 02/18/21 0616 02/19/21 0300  Weight: 45.6 kg 44.7 kg 44.9 kg    Examination: Blood pressure 129/73, pulse 99, temperature (!) 101.4 F (38.6 C), temperature source Oral, resp. rate 19, height 5\' 6"  (1.676 m), weight 44.7 kg, SpO2 97 %. Gen:      Less work of breathing today, thin appearing HEENT:  PERRL, sclera anicteric Lungs:    diminished breath sounds. Left chest tube in place, no air leak CV:         RRR, no murmurs Abd:      + bowel sounds; soft, non-tender; no palpable masses, no distension Ext:    No edema; adequate peripheral perfusion Skin:      Warm and dry; no rash Neuro: alert and oriented x 3  Resolved Hospital Problem list     Assessment & Plan:  Left lung consolidation, cavitation with bilateral airspace disease.  Differential diagnosis includes mycobacterial infection, fungal infection or malignancy given her smoking history Bronchoscopy performed 3/6 Awaiting culture and pathology results. Neutrophil predominant BALs on cell count/differential. No organisms noted on gram stain. Acid fast smears are negative. Quantiferon gold is pending. Cryptococcal ag is negative. Blastomyces ag is pending. Started on augmentin 3/8  Left pneumothorax s/p chest tube Clamp  trial failed on 3/8 Continue chest tube to suction, no air leak noted today  Best practice (evaluated daily)  Per primary  Labs   CBC: Recent Labs  Lab 02/12/21 1336 02/14/21 0444 02/15/21 0327 02/16/21 0459 02/17/21 0432 02/18/21 0353 02/19/21 0211  WBC 9.3   < > 13.5* 10.6* 10.7* 15.7* 9.5  NEUTROABS 6.8  --   --   --   --   --  8.3*  HGB 12.4   < >  10.7* 11.1* 11.3* 11.9* 10.5*  HCT 38.8   < > 33.0* 34.4* 35.3* 35.5* 32.3*  MCV 83.4   < > 80.5 80.0 81.1 79.6* 80.0  PLT 578*   < > 443* 459* 457* 452* 394   < > = values in this interval not displayed.    Basic Metabolic Panel: Recent Labs  Lab 02/15/21 0327 02/16/21 0459 02/17/21 0432 02/18/21 0353 02/19/21 0211  NA 128* 129* 130* 128* 128*  K 3.7 3.8 4.5 4.3 3.6  CL 91* 92* 92* 90* 91*  CO2 27 28 28 26 30   GLUCOSE 137* 233* 241* 278* 132*  BUN 9 8 6* 7* 8  CREATININE <0.30* 0.31* 0.39* 0.43* 0.40*  CALCIUM 8.4* 8.5* 8.7* 8.5* 7.9*   GFR: Estimated Creatinine Clearance: 51.7 mL/min (A) (by C-G formula based on SCr of 0.4 mg/dL (L)). Recent Labs  Lab 02/16/21 0459 02/17/21 0432 02/18/21 0353 02/19/21 0211  WBC 10.6* 10.7* 15.7* 9.5    Liver Function Tests: No results for input(s): AST, ALT, ALKPHOS, BILITOT, PROT, ALBUMIN in the last 168 hours. No results for input(s): LIPASE, AMYLASE in the last 168 hours. No results for input(s): AMMONIA in the last 168 hours.  ABG No results found for: PHART, PCO2ART, PO2ART, HCO3, TCO2, ACIDBASEDEF, O2SAT   Coagulation Profile: No results for input(s): INR, PROTIME in the last 168 hours.  Cardiac Enzymes: No results for input(s): CKTOTAL, CKMB, CKMBINDEX, TROPONINI in the last 168 hours.  HbA1C: Hgb A1c MFr Bld  Date/Time Value Ref Range Status  02/13/2021 03:57 AM 10.8 (H) 4.8 - 5.6 % Final    Comment:    (NOTE) Pre diabetes:          5.7%-6.4%  Diabetes:              >6.4%  Glycemic control for   <7.0% adults with diabetes   03/07/2020 11:03 AM 13.7 (H) 4.6 - 6.5 % Final    Comment:    Glycemic Control Guidelines for People with Diabetes:Non Diabetic:  <6%Goal of Therapy: <7%Additional Action Suggested:  >8%     CBG: Recent Labs  Lab 02/18/21 0618 02/18/21 1206 02/18/21 1632 02/18/21 2031 02/19/21 1124  GLUCAP 290* 210* 156* 129* 139*    Signature:   [name] Comas, MD [facility] Office: [phone]  If no response to pager , please call [phone] until 7pm After 7:00 pm call Elink  [phone] 02/19/2021, 11:56 AM

## 2021-02-19 NOTE — Progress Notes (Signed)
Physical Therapy Treatment Patient Details Name: [name] [name] MRN: [id number] DOB: [birth year] Today's Date: 02/19/2021    History of Present Illness 65 y.o. female with admitted from PCP office on 3/3 with shortness of breath, CXR showing pneumothorax, s/p chest tube placement on 3/3. Workup for TB, vs atypical infection, vs malignancy, plan for bronchoscopy on 3/7. Pt with recent fall, compression fracture of T12 (unsure if from fall or not). PMH significant for type 2 diabetes, recent vertebral fracture, tobacco use, hyperlipidemia, hypothyroidism.    PT Comments    Pt is self-limiting during this session, refusing to attempt ambulation and to sit in recliner for prolonged periods of time. Pt appears to have difficulty understanding the importance of mobility and being in an upright position despite multiple attempts at education. Pt remains unsteady when out of bed and requires PT assistance for all mobility. At end of PT session this PT assisted in stripping bed and left room briefly to retrieve clean bed linens. Upon return with clean linens the patient was found on the ground next to recliner. Pt reports no injury and vital signs appear stable compared to current baseline. Pt is assisted safely back to bed. PT and RN provide further education on the need for staff assistance to mobilize. Due to pt's self-limiting behavior and impulsiveness it is difficult at this time to progress mobility and to improve pulmonary function. Pt refuses to ambulate or sit in an upright position. Pt will continue to benefit from aggressive PT POC to improve activity tolerance and reduce falls risk.   Follow Up Recommendations  Home health PT (may need to consider inpatient placement if pt continues to regress)     Equipment Recommendations  Rolling walker with 5" wheels    Recommendations for Other Services       Precautions / Restrictions Precautions Precautions: Fall Precaution Comments: Chest tube  to wall suction, can be to water seal for mobility. On airborne precautions. Heated high flow Restrictions Weight Bearing Restrictions: No    Mobility  Bed Mobility Overal bed mobility: Needs Assistance Bed Mobility: Supine to Sit;Sit to Supine     Supine to sit: Min assist Sit to supine: Min assist        Transfers Overall transfer level: Needs assistance Equipment used: 1 person hand held assist Transfers: Sit to/from [employer] Sit to Stand: Min assist Stand pivot transfers: Min assist          Ambulation/Gait Ambulation/Gait assistance:  (pt refuses attempts at ambulation)               Optometrist    Modified Rankin (Stroke Patients Only)       Balance Overall balance assessment: Needs assistance Sitting-balance support: No upper extremity supported;Feet supported Sitting balance-Leahy Scale: Fair     Standing balance support: No upper extremity supported Standing balance-Leahy Scale: Poor Standing balance comment: minA without UE support                            Cognition Arousal/Alertness: Awake/alert Behavior During Therapy: Impulsive Overall Cognitive Status: Impaired/Different from baseline Area of Impairment: Safety/judgement;Awareness                         Safety/Judgement: Decreased awareness of safety;Decreased awareness of deficits Awareness: Intellectual          Exercises  General Comments General comments (skin integrity, edema, etc.): pt on 30L 50% FiO2 heated high flow, tachy into low 120s with activity, RR in high 30s, SpO2 stable in mid 90s      Pertinent Vitals/Pain Pain Assessment: No/denies pain    Home Living                      Prior Function            PT Goals (current goals can now be found in the care plan section) Acute Rehab PT Goals Patient Stated Goal: to get bed changed Progress towards PT goals: Not  progressing toward goals - comment (self-limiting as well as declining medical status)    Frequency    Min 3X/week      PT Plan Current plan remains appropriate    Co-evaluation              AM-PAC PT "6 Clicks" Mobility   Outcome Measure  Help needed turning from your back to your side while in a flat bed without using bedrails?: A Little Help needed moving from lying on your back to sitting on the side of a flat bed without using bedrails?: A Little Help needed moving to and from a bed to a chair (including a wheelchair)?: A Little Help needed standing up from a chair using your arms (e.g., wheelchair or bedside chair)?: A Little Help needed to walk in hospital room?: A Lot Help needed climbing 3-5 steps with a railing? : A Lot 6 Click Score: 16    End of Session Equipment Utilized During Treatment: Oxygen Activity Tolerance: Other (comment) (self-limiting, refusing to attempt ambulation or to sit in chair for >5 minutes) Patient left: in bed;with call bell/phone within reach;with bed alarm set Nurse Communication: Mobility status PT Visit Diagnosis: Other abnormalities of gait and mobility (R26.89);Difficulty in walking, not elsewhere classified (R26.2);Pain Pain - Right/Left: Left     Time: 0211-1735 PT Time Calculation (min) (ACUTE ONLY): 40 min  Charges:  $Therapeutic Activity: 23-37 mins                     Arlyss Gandy, PT, DPT Acute Rehabilitation Pager: [phone]    Arlyss Gandy 02/19/2021, 4:52 PM

## 2021-02-19 NOTE — Plan of Care (Signed)
  Problem: Clinical Measurements: Goal: Ability to maintain clinical measurements within normal limits will improve Outcome: Progressing Goal: Will remain free from infection Outcome: Progressing Goal: Diagnostic test results will improve Outcome: Progressing Goal: Respiratory complications will improve Outcome: Progressing Goal: Cardiovascular complication will be avoided Outcome: Progressing   Problem: Nutrition: Goal: Adequate nutrition will be maintained Outcome: Progressing   Problem: Activity: Goal: Risk for activity intolerance will decrease Outcome: Progressing   Problem: Safety: Goal: Ability to remain free from injury will improve Outcome: Progressing

## 2021-02-19 NOTE — Progress Notes (Signed)
Physical therapy was working with patient in room.  Physical therapist stepped out the room to go get linen to change patient's bedding.  When PT came back in the room patient was found on her buttocks on the floor.  Patient assessed.  Patient alert and oriented x4.  Patient's vitals are as follows below.  Patient was transferred to the bed.  Bed alarm set.  Dr. Ella Jubilee notified verbally that patient fell and that patient's status was stable. Will continue to monitor.      02/19/21 1609  What Happened  Was fall witnessed? No  Who witnessed fall?  (no one)  Patients activity before fall ambulating-unassisted  Point of contact buttocks  Was patient injured? No  Patient found on floor  Found by Staff-comment (physical therapist)  Stated prior activity bathroom-unassisted  Follow Up  MD notified dr. Ella Jubilee  Time MD notified [phone]  Family notified Yes - comment  Time family notified 1642  Additional tests No  Simple treatment Other (comment) (none)  Progress note created (see row info) Yes  Adult Fall Risk Assessment  Risk Factor Category (scoring not indicated) High fall risk per protocol (document High fall risk)  Patient Fall Risk Level High fall risk  Adult Fall Risk Interventions  Required Bundle Interventions *See Row Information* High fall risk - low, moderate, and high requirements implemented  Additional Interventions Use of appropriate toileting equipment (bedpan, BSC, etc.);PT/OT need assessed if change in mobility from baseline;Room near nurses station;Lap belt while in chair/wheelchair (Rehab only)  Screening for Fall Injury Risk (To be completed on HIGH fall risk patients) - Assessing Need for Floor Mats  Risk For Fall Injury- Criteria for Floor Mats None identified - No additional interventions needed  Will Implement Floor Mats Yes  Vitals  Temp 98.3 F (36.8 C)  Temp Source Oral  BP 114/74  MAP (mmHg) 84  BP Location Right Arm  BP Method Automatic  Patient Position  (if appropriate) Lying  Pulse Rate (!) 120  Pulse Rate Source Monitor  ECG Heart Rate (!) 121  Resp (!) 35  Oxygen Therapy  SpO2 96 %  O2 Device HFNC  Heater temperature 89.6 F (32 C)  O2 Flow Rate (L/min) 30 L/min  FiO2 (%) 50 %  Pain Assessment  Pain Scale 0-10  Pain Score 0  Neurological  Neuro (WDL) WDL  Level of Consciousness Alert  Orientation Level Oriented X4  Cognition Follows commands  Speech Clear  Glasgow Coma Scale  Eye Opening 4  Best Verbal Response (NON-intubated) 5  Best Motor Response 6  Glasgow Coma Scale Score 15  Musculoskeletal  Musculoskeletal (WDL) X  Assistive Device BSC  Generalized Weakness Yes  Weight Bearing Restrictions No  Integumentary  Integumentary (WDL) WDL  Skin Color Appropriate for ethnicity  Skin Condition Dry  Skin Integrity Intact  Skin Turgor Non-tenting

## 2021-02-19 NOTE — Progress Notes (Signed)
PROGRESS NOTE    Donatella Walski  TGG:[id number] DOB: September 15, 1956 DOA: 02/12/2021 PCP: Truett Perna, MD    Brief Narrative:  Mrs. Koury was admitted to the hospital working diagnosis of left tension spontaneous pneumothorax, left upper lobe cavitation, community acquired pneumonia (present on admission). Acute hypoxemic respiratory failure.   65 year old female past medical history for type 2 diabetes mellitus, recent vertebral fracture, tobacco abuse, dyslipidemia and hypothyroidism.  As an outpatient she was diagnosed with pneumothorax.  In the ED she was found to have a 90% left tension hydropneumothorax with severe compression atelectasis of the lung medially and marked mediastinal shift.  She had a left chest tube placed with good toleration. Her blood pressure was 146/81, heart rate 94, respirate 23, oxygen saturation 98%.  She had increased work of breathing, left-sided chest tube in place with decreased breath sounds bilaterally.  Heart S1-S2, present rhythmic, soft abdomen, no lower extremity edema.  Sodium 133, potassium 4.3, chloride 97, bicarb 22, glucose 243, BUN 8, creatinine 0.50, white count 9.3, hemoglobin 12.4, hematocrit 38.8, platelets 578. SARS COVID-19 negative.  Chest radiograph with attention left-sided pneumothorax with mediastinal deviation to the right. EKG 118 bpm, normal axis, normal intervals, sinus rhythm, no ST segment T wave changes, positive LVH per EKG criteria.  Follow-up CT chest showed cavitary lesion.  She was placed on broad-spectrum antibiotic therapy.  Underwent bronchoscopy showing significant amount of purulent secretions on the left side.  She is on respiratory isolation, possible TB.  Not able to clamp chest tube due to recurrent pneumothorax.   Patient with worsening oxygen requirements, placed on heated high flow nasal cannula 30 L/min.    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 Left tension spontaneous hydro-pneumothorax/ community aquired/ atypical pneumonia complicated with severe sepsis (end-organ failure hypoxemia) and acute hypoxemic respiratory failure.   Patient with worsening oxygen requirements yesterday, now on heated high flow nasal cannula up to 30 L/min with oxygen saturation at 98% with improvement in dyspnea.   Today's chest film personally reviewed noted dense alveolar infiltrate at the left lower lobe with upper left upper lobe cavitary lesion.  Chest tune in place, and continue to suction 20 cm H20, small pneumothorax the left lower zone. No air leak appreciated. Wbc down to 9,5 and patient continue to be afebrile. She looks less fatigued than yesterday.   Acid fast smear negative x2, MTB complex pending.  Case discussed with ID Dr. Renold Don, low pretest probability for PJP.  Clinical picture more consistent with bacterial infection, infected cavitary lesion, possible underlying malignancy.  Once MTB complex negative will plan to discontinue respiratory isolation.  Continue supplemental 02 per  to keep oxygen saturation more than 92%.  Bronchodilator therapy and airway clearing techniques.  Out of bed to chair tid with meals.  Follow up on cell count and further micro.  Continue antibiotic therapy with Augmentin.   2. Hyponatremia/ metabolic alkalosis. Continue with hyponatremia at 128, renal function stable with serum cr at 0.40 and bicarbonate at 30 with K at 3,6.  Patient with improved po intake today, will hold on IV fluids for now, possible component of SIADH.  IF persistent hyponatremia in the next 24  hr will order urine studies.   3. T2DM/ dyslpidemia. Serum glucose this am down to 132, continue  close monitoring with insulin sliding scale, her po intake has improved.  On atorvastatin.   4. Hypothyroid On levothyroxine   5. Moderate calorie protein malnutrition. On nutritional supplements.   6. Thoracic vertebral compression fracture. On as needed morphine and tramadol with good toleration.    Patient continue to be at high risk for worsening respiratory failure   Status is: Inpatient  Remains inpatient appropriate because:IV treatments appropriate due to intensity of illness or inability to take PO   Dispo: The patient is from: Home              Anticipated d/c is to: Home              Patient currently is not medically stable to d/c.   Difficult to place patient No    DVT prophylaxis: Enoxaparin   Code Status:   full  Family Communication:  I was not able to reach her family at [phone]    Nutrition Status: Nutrition Problem: Moderate Malnutrition Etiology: chronic illness (DM) Signs/Symptoms: energy intake < or equal to 75% for > or equal to 1 month,mild fat depletion,moderate fat depletion,mild muscle depletion,moderate muscle depletion Interventions: MVI,Liberalize Diet     Consultants:   ID  Pulmonary   Procedures:   Bronchoscopy   Antimicrobials:   Augmentin     Subjective: Patient is feeling better, continue to have dyspnea with movement, not back to her baseline, her po intake has improved and tolerating well heated high flow nasal cannula.   Objective: Vitals:   02/19/21 0318 02/19/21 0746 02/19/21 0752 02/19/21 1000  BP:  106/74  114/74  Pulse: (!) 116 (!) 114 (!) 116 99  Resp: (!) 30 (!) 22 (!) 23 (!) 27  Temp:  98.4 F (36.9 C)  98.4 F (36.9 C)  TempSrc:  Oral  Oral  SpO2:  94% 93% 98%  Weight:      Height:        Intake/Output Summary (Last 24 hours) at 02/19/2021 1218 Last data filed at 02/19/2021 1124 Gross per 24 hour  Intake 2387.26 ml  Output 840 ml  Net 1547.26 ml   Filed Weights   02/17/21 0415 02/18/21  0616 02/19/21 0300  Weight: 45.6 kg 44.7 kg 44.9 kg    Examination:   General: positive dyspnea with reset and worse with exertion  Neurology: Awake and alert, non focal  E ENT: positive pallor, no icterus, oral mucosa moist  Cardiovascular: No JVD. S1-S2 present, rhythmic, no gallops, rubs, or murmurs. No lower extremity edema. Pulmonary: positive breath sounds bilaterally, decreased air movement, no wheezing, bilateral rhonchi and rales bilaterally. Gastrointestinal. Abdomen soft and non tender Skin. No rashes Musculoskeletal: no joint deformities     Data Reviewed: I have personally reviewed following labs and imaging studies  CBC: Recent Labs  Lab 02/12/21 1336 02/14/21 0444 02/15/21 0327 02/16/21 0459 02/17/21 0432 02/18/21 0353 02/19/21 0211  WBC 9.3   < > 13.5* 10.6* 10.7* 15.7* 9.5  NEUTROABS 6.8  --   --   --   --   --  8.3*  HGB 12.4   < > 10.7* 11.1* 11.3* 11.9* 10.5*  HCT 38.8   < > 33.0* 34.4* 35.3* 35.5* 32.3*  MCV 83.4   < > 80.5 80.0 81.1 79.6* 80.0  PLT 578*   < > 443* 459* 457* 452* 394   < > =  values in this interval not displayed.   Basic Metabolic Panel: Recent Labs  Lab 02/15/21 0327 02/16/21 0459 02/17/21 0432 02/18/21 0353 02/19/21 0211  NA 128* 129* 130* 128* 128*  K 3.7 3.8 4.5 4.3 3.6  CL 91* 92* 92* 90* 91*  CO2 27 28 28 26 30   GLUCOSE 137* 233* 241* 278* 132*  BUN 9 8 6* 7* 8  CREATININE <0.30* 0.31* 0.39* 0.43* 0.40*  CALCIUM 8.4* 8.5* 8.7* 8.5* 7.9*   GFR: Estimated Creatinine Clearance: 51.7 mL/min (A) (by C-G formula based on SCr of 0.4 mg/dL (L)). Liver Function Tests: No results for input(s): AST, ALT, ALKPHOS, BILITOT, PROT, ALBUMIN in the last 168 hours. No results for input(s): LIPASE, AMYLASE in the last 168 hours. No results for input(s): AMMONIA in the last 168 hours. Coagulation Profile: No results for input(s): INR, PROTIME in the last 168 hours. Cardiac Enzymes: No results for input(s): CKTOTAL, CKMB,  CKMBINDEX, TROPONINI in the last 168 hours. BNP (last 3 results) No results for input(s): PROBNP in the last 8760 hours. HbA1C: No results for input(s): HGBA1C in the last 72 hours. CBG: Recent Labs  Lab 02/18/21 0618 02/18/21 1206 02/18/21 1632 02/18/21 2031 02/19/21 1124  GLUCAP 290* 210* 156* 129* 139*   Lipid Profile: No results for input(s): CHOL, HDL, LDLCALC, TRIG, CHOLHDL, LDLDIRECT in the last 72 hours. Thyroid Function Tests: No results for input(s): TSH, T4TOTAL, FREET4, T3FREE, THYROIDAB in the last 72 hours. Anemia Panel: No results for input(s): VITAMINB12, FOLATE, FERRITIN, TIBC, IRON, RETICCTPCT in the last 72 hours.    Radiology Studies: I have reviewed all of the imaging during this hospital visit personally     Scheduled Meds:  amoxicillin-clavulanate  1 tablet Oral Q12H   atorvastatin  10 mg Oral QPM   calcitonin (salmon)  1 spray Alternating Nares Daily   cholecalciferol  1,000 Units Oral Daily   insulin aspart  0-5 Units Subcutaneous QHS   insulin aspart  0-9 Units Subcutaneous TID WC   levothyroxine  50 mcg Oral QAC breakfast   lidocaine  1 patch Transdermal Q24H   Continuous Infusions:  sodium chloride 75 mL/hr at 02/19/21 1124     LOS: 7 days        Lajarvis Italiano 04/21/21, MD

## 2021-02-19 NOTE — Progress Notes (Signed)
[place] for Infectious Disease  Date of Admission:  02/12/2021      Lines:  Peripheral iv's  Abx: 3/08-c amoxicillin-clavulonate                                                            Assessment: Sepsis Upper lobe Cavitary chest lesion left side; bilateral airspace opacity L>>R Left pneumothorax s/p tube thoracostomy 3/03 Thoracic mid back pain  65 yo female [place] smoker, immigrant admitted with 3 weeks at least progressive cough, sob, found to have fever and chest ct left cavitary lesions/bilateral air-space opacity   ddx agree tb/endemic fungi vs malignancy. 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 The chest ct (albeit poor resolution) doesn't visualize any osseous pyogenic concern so far   ----------- 3/10 assessment fio2 50% on 30L o2 since 3/09 with improved tachypnea/respiratory distress Wbc improving and no fever since amox-clav started Pending pathology from BAL and afb stain/cx remains negative; pending rifampin/mtb pcr as well  Discussed with primary team. It would be unusual for severe cavitary TB to have negative AFB stain (it is a very high burden process). I am worried she could have malignancy. It looks like she does have a superimposed polymicrobial abscess/blepharitis process which is responding to amox-clav  If she continues to have good improvement on amox-clav (continued no fever and improved cough) and negative rifampin/mtb pcr could potentially take off airborne isolation  Plan: 1. F/u BAL tb cx and pathology, and rifampin/mtb pcr 2. Continue amox-clav 3. F/u endemic fungal serology  and quantiferon gold as well   I spent more than 30 minute reviewing data/chart, and coordinating care and >50% direct face to face time providing counseling/discussing diagnostics/treatment plan with patient   Principal Problem:   Pneumothorax on left Active Problems:   Type II diabetes mellitus with proliferative retinopathy (HCC)   Compression fracture of T12 vertebra (HCC)   Weight loss   Pneumothorax   Malnutrition of moderate degree   Lobar pneumonia, unspecified organism ([place])   Cavitary lesion of lung   Scheduled Meds: . amoxicillin-clavulanate  1 tablet Oral Q12H  . atorvastatin  10 mg Oral QPM  . calcitonin (salmon)  1 spray Alternating Nares Daily  . cholecalciferol  1,000 Units Oral Daily  . insulin aspart  0-5 Units Subcutaneous QHS  . insulin aspart  0-9 Units Subcutaneous TID WC  . levothyroxine  50 mcg Oral QAC breakfast  . lidocaine  1 patch Transdermal Q24H   Continuous Infusions: . sodium chloride 75 mL/hr at 02/19/21 0323   PRN Meds:.acetaminophen **OR** acetaminophen, morphine injection, traMADol   SUBJECTIVE: Overwhelmed by the whole process But improved dyspnea since yesterday since o2 escalated to 30L 50% fio2 No fever since 3/08 Minimal cough No n/v/diarrhea/rash  Chest tube to waterseal     Review of Systems: ROS Other ros negative  No Known Allergies  OBJECTIVE: Vitals:   02/19/21 0318 02/19/21 0746 02/19/21 0752 02/19/21 1000  BP:  106/74  114/74  Pulse: (!) 116 (!) 114 (!) 116 99  Resp: (!) 30 (!) 22 (!) 23 (!) 27  Temp:  98.4 F (36.9 C)  98.4 F (36.9 C)  TempSrc:  Oral  Oral  SpO2:  94% 93% 98%  Weight:      Height:       Body mass index is 15.98 kg/m.  Physical Exam Not tachypneic today, conversant short sentences, no distress Heent: normocephalic; per; conj clear Neck supple cv rrr no mrg Lungs tachypneic; rhonci right lung, decreased bs left lung; chest tube in placed serous discharge abd s/nt Neuro  nonfocal   Lab Results Lab Results  Component Value Date   WBC 9.5 02/19/2021   HGB 10.5 (L) 02/19/2021   HCT 32.3 (L) 02/19/2021   MCV 80.0 02/19/2021   PLT 394 02/19/2021    Lab Results  Component Value Date   CREATININE 0.40 (L) 02/19/2021   BUN 8 02/19/2021   NA 128 (L) 02/19/2021   K 3.6 02/19/2021   CL 91 (L) 02/19/2021   CO2 30 02/19/2021    Lab Results  Component Value Date   ALT 16 01/22/2021   AST 22 01/22/2021   ALKPHOS 95 01/22/2021   BILITOT 1.1 01/22/2021     Microbiology: Recent Results (from the past 240 hour(s))  Resp Panel by RT-PCR (Flu A&B, Covid) Nasopharyngeal Swab     Status: None   Collection Time: 02/12/21  3:44 PM   Specimen: Nasopharyngeal Swab; Nasopharyngeal(NP) swabs in vial transport medium  Result Value Ref Range Status   SARS Coronavirus 2 by RT PCR NEGATIVE NEGATIVE Final    Comment: (NOTE) SARS-CoV-2 target nucleic acids are NOT DETECTED.  The SARS-CoV-2 RNA is generally detectable in upper respiratory specimens during the acute phase of infection. The lowest concentration of SARS-CoV-2 viral copies this assay can detect is 138 copies/mL. A negative result does not preclude SARS-Cov-2 infection and should not be used as the sole basis for treatment or other patient management decisions. A negative result may occur with  improper specimen collection/handling, submission of specimen other than nasopharyngeal swab, presence of viral mutation(s) within the areas targeted by this assay, and inadequate number of viral copies(<138 copies/mL). A negative result must be combined with clinical observations, patient history, and epidemiological information. The expected result is Negative.  Fact Sheet for Patients:  EntrepreneurPulse.com.au  Fact Sheet for Healthcare Providers:  IncredibleEmployment.be  This test is no t yet approved or cleared by the [place] FDA and  has been authorized for  detection and/or diagnosis of SARS-CoV-2 by FDA under an Emergency Use Authorization (EUA). This EUA will remain  in effect (meaning this test can be used) for the duration of the COVID-19 declaration under Section 564(b)(1) of the Act, 21 U.S.C.section 360bbb-3(b)(1), unless the authorization is terminated  or revoked sooner.       Influenza A by PCR NEGATIVE NEGATIVE Final   Influenza B by PCR NEGATIVE NEGATIVE Final    Comment: (NOTE) The Xpert Xpress SARS-CoV-2/FLU/RSV plus assay is intended as an aid in the diagnosis of influenza from Nasopharyngeal swab specimens and should not be used as a sole basis for treatment. Nasal washings and aspirates are unacceptable for Xpert Xpress SARS-CoV-2/FLU/RSV testing.  Fact Sheet for Patients: EntrepreneurPulse.com.au  Fact Sheet for Healthcare Providers: IncredibleEmployment.be  This test is not yet approved or cleared by the [place] FDA and has been authorized for detection and/or diagnosis of SARS-CoV-2 by FDA under an Emergency Use Authorization (  EUA). This EUA will remain in effect (meaning this test can be used) for the duration of the COVID-19 declaration under Section 564(b)(1) of the Act, 21 U.S.C. section 360bbb-3(b)(1), unless the authorization is terminated or revoked.  Performed at [facility], [place] [address] [place], [place] [address]   Culture, blood (routine x 2)     Status: None   Collection Time: 02/13/21  2:25 PM   Specimen: BLOOD  Result Value Ref Range Status   Specimen Description BLOOD LEFT ANTECUBITAL  Final   Special Requests   Final    BOTTLES DRAWN AEROBIC AND ANAEROBIC Blood Culture adequate volume   Culture   Final    NO GROWTH 5 DAYS Performed at [facility], [place] [address], [place], [place] [address]    Report Status 02/18/2021 FINAL  Final  Culture, blood (routine x 2)     Status: None   Collection Time: 02/13/21  2:29 PM   Specimen:  BLOOD  Result Value Ref Range Status   Specimen Description BLOOD RIGHT ANTECUBITAL  Final   Special Requests   Final    BOTTLES DRAWN AEROBIC AND ANAEROBIC Blood Culture adequate volume   Culture   Final    NO GROWTH 5 DAYS Performed at [facility], [place] [address] [place], [place] [address]    Report Status 02/18/2021 FINAL  Final  Expectorated Sputum Assessment w Gram Stain, Rflx to Resp Cult     Status: None   Collection Time: 02/13/21  3:13 PM   Specimen: Expectorated Sputum  Result Value Ref Range Status   Specimen Description EXPECTORATED SPUTUM  Final   Special Requests Normal  Final   Sputum evaluation   Final    THIS SPECIMEN IS ACCEPTABLE FOR SPUTUM CULTURE Performed at [facility], [place] [address] [place], [place] [address]    Report Status 02/14/2021 FINAL  Final  Culture, Respiratory w Gram Stain     Status: None   Collection Time: 02/13/21  3:13 PM  Result Value Ref Range Status   Specimen Description EXPECTORATED SPUTUM  Final   Special Requests Normal Reflexed from [id number]  Final   Gram Stain   Final    MODERATE WBC PRESENT,BOTH PMN AND MONONUCLEAR FEW GRAM POSITIVE COCCI RARE GRAM VARIABLE ROD    Culture   Final    MODERATE Normal respiratory flora-no Staph aureus or Pseudomonas seen Performed at [facility], [place] [address], [place], [place] [address]    Report Status 02/16/2021 FINAL  Final  Culture, Urine     Status: Abnormal   Collection Time: 02/13/21 10:03 PM   Specimen: Urine, Clean Catch  Result Value Ref Range Status   Specimen Description URINE, CLEAN CATCH  Final   Special Requests NONE  Final   Culture (A)  Final    <10,000 COLONIES/mL INSIGNIFICANT GROWTH Performed at [facility], [place] [address], [place], [place] [address]    Report Status 02/15/2021 FINAL  Final  Culture, respiratory     Status: None   Collection Time: 02/16/21 12:12 PM   Specimen: Bronchoalveolar Lavage; Respiratory  Result Value Ref Range  Status   Specimen Description BRONCHIAL ALVEOLAR LAVAGE  Final   Special Requests NONE  Final   Gram Stain   Final    FEW WBC PRESENT, PREDOMINANTLY PMN NO ORGANISMS SEEN    Culture   Final    NO GROWTH 2 DAYS Performed at [facility],  [address], [place], [place] [address]    Report Status 02/19/2021 FINAL  Final  Acid Fast Smear (AFB)     Status: None   Collection Time: 02/16/21 12:12 PM   Specimen: Bronchial Alveolar Lavage  Result Value Ref Range Status   AFB Specimen Processing Concentration  Final   Acid Fast Smear Negative  Final    Comment: (NOTE) Performed At: [facility] [place], [place] [id number] Rush Farmer MD [id number]    Source (AFB) BRONCHIAL ALVEOLAR LAVAGE  Final    Comment: LUL Performed at [facility], [place] [address], [place], [place] [address]   Fungus Culture With Stain     Status: None (Preliminary result)   Collection Time: 02/16/21 12:12 PM   Specimen: Bronchial Alveolar Lavage  Result Value Ref Range Status   Fungus Stain Final report  Final    Comment: (NOTE) Performed At: [facility] [place], [place] [id number] Rush Farmer MD [id number]    Fungus (Mycology) Culture PENDING  Incomplete   Fungal Source BRONCHIAL ALVEOLAR LAVAGE  Final    Comment: LUL Performed at [facility], [place] [address] [place], [place] [address]   MTB RIF NAA w/o Culture, Sputum     Status: None (Preliminary result)   Collection Time: 02/16/21 12:12 PM   Specimen: Expectorated Sputum  Result Value Ref Range Status   M Tuberculosis Complex PENDING  Incomplete   Rifampin PENDING  Incomplete   AFB Specimen Processing Direct Inoculation  Final    Comment: (NOTE) Performed At: [facility] [place], [place] [id number] Rush Farmer MD [id number]   Fungus Culture Result     Status: None   Collection Time: 02/16/21 12:12 PM  Result Value Ref Range Status    Result 1 Comment  Final    Comment: (NOTE) KOH/Calcofluor preparation:  no fungus observed. Performed At: [facility] [place], [place] [id number] Rush Farmer MD [id number]   Culture, respiratory     Status: None   Collection Time: 02/16/21 12:48 PM   Specimen: Bronchoalveolar Lavage; Respiratory  Result Value Ref Range Status   Specimen Description BRONCHIAL ALVEOLAR LAVAGE  Final   Special Requests LINGULA  Final   Gram Stain   Final    RARE WBC PRESENT, PREDOMINANTLY PMN NO ORGANISMS SEEN    Culture   Final    NO GROWTH 2 DAYS Performed at [facility], [address] [place], [place] [address]    Report Status 02/19/2021 FINAL  Final  Acid Fast Smear (AFB)     Status: None   Collection Time: 02/16/21 12:48 PM   Specimen: Bronchial Alveolar Lavage  Result Value Ref Range Status   AFB Specimen Processing Concentration  Final   Acid Fast Smear Negative  Final    Comment: (NOTE) Performed At: [facility] [place], [place] [id number] Rush Farmer MD [id number]    Source (AFB) BRONCHIAL ALVEOLAR LAVAGE  Final    Comment: LINGULA Performed at [facility], [place] [address], [place], [place] [address]   Fungus Culture With Stain     Status: None (Preliminary result)   Collection Time: 02/16/21 12:48 PM   Specimen: Bronchial Alveolar Lavage  Result Value Ref Range Status   Fungus Stain Final report  Final    Comment: (NOTE) Performed At: [facility] [address] [place], [place] [id number] Rush Farmer MD [id number]    Fungus (  Mycology) Culture PENDING  Incomplete   Fungal Source BRONCHIAL ALVEOLAR LAVAGE  Final    Comment: LINGULA Performed at [facility], [place] [address], [place], [place] [address]   Fungus Culture Result     Status: None   Collection Time: 02/16/21 12:48 PM  Result Value Ref Range Status   Result 1 Comment  Final    Comment: (NOTE) KOH/Calcofluor  preparation:  no fungus observed. Performed At: [facility] [address] [place], [place] [id number] Rush Farmer MD [id number]   MTB RIF NAA w/o Culture, Sputum     Status: None (Preliminary result)   Collection Time: 02/17/21 12:48 PM   Specimen: Expectorated Sputum  Result Value Ref Range Status   M Tuberculosis Complex PENDING  Incomplete   Rifampin PENDING  Incomplete   AFB Specimen Processing Concentration  Final    Comment: (NOTE) Performed At: [facility] [address] [place], [place] [id number] Rush Farmer MD [id number]     Serology: Serology: hiv negative Cryptococcal ag negative Pending endemic fungi serology 3/07 & 3/08 rifampin/mtb pcr pending  Micro: 3/07 BAL fluid LUL  afb stain negative; cx in progress Fungal cx in progress Respiratory culture ngtd Wbc 7341 >93% neutrophilic  Path: 7/90 BAL LUL cytology/biopsy in progress  Imaging: If present, new imagings (plain films, ct scans, and mri) have been personally visualized and interpreted; radiology reports have been reviewed. Decision making incorporated into the Impression / Recommendations.  3/10 cxr 1. Left chest tube in stable position. Reappearance of tiny left basilar pneumothorax may be present on today's examination. No prominent pneumothorax noted. 2. Diffuse left lung infiltrate with left upper lung cavitation again noted.  3/09 cxr Chest tube remains on the left with interval resolution of left lateral and basilar pneumothorax. Cavitation left upper lobe persists with consolidation throughout much of the left mid and lower lung regions, stable. Equivocal left pleural effusion. Patchy infiltrate on the right stable without progression. Stable cardiac silhouette  3/07 cxr Indwelling left chest pigtail No pneumothorax status post bronchoscopy. Otherwise unchanged  3/03 chest ct 1. Bilateral areas of airspace disease, much more pronounced throughout  the left lung with areas of dense consolidation and thick-walled cavities as above. Overall, findings favor atypical infection such as fungal pneumonia or even tuberculosis. Sputum analysis may be useful. Cavitating malignancy cannot be completely excluded, though the widespread disease would be atypical. 2. Multiple subcentimeter solid nodules within the right upper lobe, largest measuring 9 mm. 3. Trace residual left pneumothorax with indwelling left chest tube as above. 4. Trace left pleural effusion. 5. No evidence of pulmonary embolus. 6. Aortic Atherosclerosis  01/22/21 ct lumbar spine 1. Acute biconvex compression deformity of the T12 vertebral body with acute transcortical lucency through the superior and inferior endplates as well as the anterior and posterior cortices technically qualifying this as a burst fracture (AO spine A4) albeit with only minimal 2 mm of retropulsion of the superior endplate of approximately 2 mm without resulting canal stenosis. No clear extension into the posterior tension band. Mild paravertebral swelling and thickening adjacent the fracture line compatible with an acute injury. Intervertebral gas likely related to intravasation of the degenerative nitrogenous gas phenomenon seen within the adjacent disc spaces. 2. Discogenic and facet degenerative changes throughout the lumbar spine as described above. 3. Aortic Atherosclerosis   Jabier Mutton, [place] for Infectious [place] [phone] pager    02/19/2021, 10:53 AM

## 2021-02-19 NOTE — Progress Notes (Addendum)
      [address]       [place] [address]             [phone]       3 Days Post-Op Procedure(s) (LRB): VIDEO BRONCHOSCOPY WITH FLUORO (Left) BRONCHIAL BIOPSIES BRONCHIAL BRUSHINGS BRONCHIAL WASHINGS  Subjective: Resting in bed. No events over night. Continues to have a coarse cough.    Objective: Vital signs in last 24 hours: Temp:  [98 F (36.7 C)-98.9 F (37.2 C)] 98.4 F (36.9 C) (03/10 0746) Pulse Rate:  [107-120] 116 (03/10 0752) Cardiac Rhythm: Sinus tachycardia (03/10 0730) Resp:  [22-41] 23 (03/10 0752) BP: (106-142)/(65-86) 106/74 (03/10 0746) SpO2:  [93 %-100 %] 93 % (03/10 0752) FiO2 (%):  [50 %] 50 % (03/10 0752) Weight:  [44.9 kg] 44.9 kg (03/10 0300)      Intake/Output from previous day: 03/09 0701 - 03/10 0700 In: 1528.5 [P.O.:540; I.V.:988.5] Out: 1230 [Urine:1200; Chest Tube:30]   Physical Exam:  Cardiovascular: HR 107 Pulmonary: Clear to auscultation on right and diminished on left.  CXR showing a tiny basilar PTX this morning.  Abdomen: Soft, non tender Extremities: Mild bilateral lower extremity edema. Chest Tube: to suction, no air leak  Lab Results: CBC: Recent Labs    02/18/21 0353 02/19/21 0211  WBC 15.7* 9.5  HGB 11.9* 10.5*  HCT 35.5* 32.3*  PLT 452* 394   BMET:  Recent Labs    02/18/21 0353 02/19/21 0211  NA 128* 128*  K 4.3 3.6  CL 90* 91*  CO2 26 30  GLUCOSE 278* 132*  BUN 7* 8  CREATININE 0.43* 0.40*  CALCIUM 8.5* 7.9*    EXAM: PORTABLE CHEST 1 VIEW  COMPARISON:  Prior studies of 02/18/2021.  CT 02/12/2021.  FINDINGS: Left chest tube in stable position. Reappearance of tiny left basilar pneumothorax may be present on today's examination. No prominent pneumothorax noted. Prominent skin folds noted on left. Diffuse left lung infiltrate with left upper lung cavitation again noted. Mild interstitial prominence on the right. Tiny left pleural effusion again cannot be excluded. Heart  size stable.  IMPRESSION: 1. Left chest tube in stable position. Reappearance of tiny left basilar pneumothorax may be present on today's examination. No prominent pneumothorax noted. 2. Diffuse left lung infiltrate with left upper lung cavitation again noted.  Assessment/Plan:   -65yo former smoker with 15kg Wt loss over the past 3 months admitted with a left tension PTX that has resolved with insertion of a left pigtail pleural tube by the ED physician.  Follow up CT scan shows the left upper lobe and superior segment of the left lower lobe are replace with a thick-walled cavity.  Additionally, there are multiple solid nodules in the right upper lobe.  Pulmonary medicine consulted and bronchoscopy was done on 3/7. Purulent secretions noted in RUL bronchus.  Brushings, biopsies, and cultures obtained.  AFB smear negative, other studies are still pending.    -Recurrent PTX early yesterday morning that resolved with placing the CT back to suction. No active air leak but will leave the CT to suction for now.    [address] 02/19/2021,8:49 AM [phone]   I have seen and examined the patient and agree with the assessment and plan as outlined.  Purcell Nails, MD 02/19/2021 2:31 PM

## 2021-02-20 ENCOUNTER — Encounter (HOSPITAL_COMMUNITY): Payer: Self-pay | Admitting: Internal Medicine

## 2021-02-20 ENCOUNTER — Inpatient Hospital Stay (HOSPITAL_COMMUNITY): Payer: BLUE CROSS/BLUE SHIELD

## 2021-02-20 DIAGNOSIS — A310 Pulmonary mycobacterial infection: Secondary | ICD-10-CM | POA: Diagnosis not present

## 2021-02-20 DIAGNOSIS — J939 Pneumothorax, unspecified: Secondary | ICD-10-CM | POA: Diagnosis not present

## 2021-02-20 DIAGNOSIS — J984 Other disorders of lung: Secondary | ICD-10-CM

## 2021-02-20 DIAGNOSIS — J948 Other specified pleural conditions: Secondary | ICD-10-CM | POA: Diagnosis not present

## 2021-02-20 DIAGNOSIS — J181 Lobar pneumonia, unspecified organism: Secondary | ICD-10-CM | POA: Diagnosis not present

## 2021-02-20 DIAGNOSIS — J9601 Acute respiratory failure with hypoxia: Secondary | ICD-10-CM

## 2021-02-20 DIAGNOSIS — S22080D Wedge compression fracture of T11-T12 vertebra, subsequent encounter for fracture with routine healing: Secondary | ICD-10-CM | POA: Diagnosis not present

## 2021-02-20 DIAGNOSIS — A159 Respiratory tuberculosis unspecified: Secondary | ICD-10-CM | POA: Insufficient documentation

## 2021-02-20 LAB — CBC WITH DIFFERENTIAL/PLATELET
Abs Immature Granulocytes: 0.13 10*3/uL — ABNORMAL HIGH (ref 0.00–0.07)
Basophils Absolute: 0 10*3/uL (ref 0.0–0.1)
Basophils Relative: 0 %
Eosinophils Absolute: 0 10*3/uL (ref 0.0–0.5)
Eosinophils Relative: 0 %
HCT: 32.2 % — ABNORMAL LOW (ref 36.0–46.0)
Hemoglobin: 10.5 g/dL — ABNORMAL LOW (ref 12.0–15.0)
Immature Granulocytes: 2 %
Lymphocytes Relative: 8 %
Lymphs Abs: 0.6 10*3/uL — ABNORMAL LOW (ref 0.7–4.0)
MCH: 26.1 pg (ref 26.0–34.0)
MCHC: 32.6 g/dL (ref 30.0–36.0)
MCV: 79.9 fL — ABNORMAL LOW (ref 80.0–100.0)
Monocytes Absolute: 0.5 10*3/uL (ref 0.1–1.0)
Monocytes Relative: 7 %
Neutro Abs: 6 10*3/uL (ref 1.7–7.7)
Neutrophils Relative %: 83 %
Platelets: 393 10*3/uL (ref 150–400)
RBC: 4.03 MIL/uL (ref 3.87–5.11)
RDW: 13.9 % (ref 11.5–15.5)
WBC: 7.2 10*3/uL (ref 4.0–10.5)
nRBC: 0 % (ref 0.0–0.2)

## 2021-02-20 LAB — GLUCOSE, CAPILLARY
Glucose-Capillary: 141 mg/dL — ABNORMAL HIGH (ref 70–99)
Glucose-Capillary: 162 mg/dL — ABNORMAL HIGH (ref 70–99)
Glucose-Capillary: 139 mg/dL — ABNORMAL HIGH (ref 70–99)
Glucose-Capillary: 104 mg/dL — ABNORMAL HIGH (ref 70–99)
Glucose-Capillary: 181 mg/dL — ABNORMAL HIGH (ref 70–99)

## 2021-02-20 LAB — QUANTIFERON-TB GOLD PLUS: QuantiFERON-TB Gold Plus: UNDETERMINED — AB

## 2021-02-20 LAB — QUANTIFERON-TB GOLD PLUS (RQFGPL)
QuantiFERON Mitogen Value: >10 IU/mL
QuantiFERON Nil Value: 9.93 IU/mL
QuantiFERON TB1 Ag Value: >10 IU/mL
QuantiFERON TB2 Ag Value: >10 IU/mL

## 2021-02-20 LAB — BASIC METABOLIC PANEL
Anion gap: 7 (ref 5–15)
BUN: 9 mg/dL (ref 8–23)
CO2: 29 mmol/L (ref 22–32)
Calcium: 8.1 mg/dL — ABNORMAL LOW (ref 8.9–10.3)
Chloride: 91 mmol/L — ABNORMAL LOW (ref 98–111)
Creatinine, Ser: 0.39 mg/dL — ABNORMAL LOW (ref 0.44–1.00)
GFR, Estimated: >60 mL/min (ref >60)
Glucose, Bld: 149 mg/dL — ABNORMAL HIGH (ref 70–99)
Potassium: 3.8 mmol/L (ref 3.5–5.1)
Sodium: 127 mmol/L — ABNORMAL LOW (ref 135–145)

## 2021-02-20 LAB — MTB RIF NAA W/O CULTURE, SPUTUM

## 2021-02-20 LAB — CYTOLOGY - NON PAP

## 2021-02-20 LAB — SURGICAL PATHOLOGY

## 2021-02-20 LAB — BLASTOMYCES ANTIGEN: Blastomyces Antigen: NOT DETECTED ng/mL

## 2021-02-20 MED ORDER — VITAMIN B-6 50 MG PO TABS
50.0000 mg | ORAL_TABLET | Freq: Every day | ORAL | Status: DC
  Administered 2021-02-20 – 2021-03-04 (×13): 50 mg via ORAL
  Filled 2021-02-20 (×13): qty 1

## 2021-02-20 MED ORDER — SODIUM CHLORIDE 1 G PO TABS
1.0000 g | ORAL_TABLET | Freq: Three times a day (TID) | ORAL | Status: DC
  Administered 2021-02-20 – 2021-02-23 (×8): 1 g via ORAL
  Filled 2021-02-20 (×9): qty 1

## 2021-02-20 MED ORDER — LACTATED RINGERS IV BOLUS
500.0000 mL | Freq: Once | INTRAVENOUS | Status: AC
  Administered 2021-02-20: 500 mL via INTRAVENOUS

## 2021-02-20 MED ORDER — ISONIAZID 300 MG PO TABS
300.0000 mg | ORAL_TABLET | Freq: Every day | ORAL | Status: DC
  Administered 2021-02-20 – 2021-03-04 (×13): 300 mg via ORAL
  Filled 2021-02-20 (×13): qty 1

## 2021-02-20 MED ORDER — PYRAZINAMIDE 500 MG PO TABS
1000.0000 mg | ORAL_TABLET | Freq: Every day | ORAL | Status: DC
  Administered 2021-02-20 – 2021-03-04 (×13): 1000 mg via ORAL
  Filled 2021-02-20 (×13): qty 2

## 2021-02-20 MED ORDER — ETHAMBUTOL HCL 400 MG PO TABS
800.0000 mg | ORAL_TABLET | Freq: Every day | ORAL | Status: DC
  Administered 2021-02-20 – 2021-03-04 (×13): 800 mg via ORAL
  Filled 2021-02-20 (×13): qty 2

## 2021-02-20 MED ORDER — LACTATED RINGERS IV BOLUS: 500.0000 mL | Freq: Once | INTRAVENOUS | Status: DC

## 2021-02-20 MED ORDER — RIFAMPIN 300 MG PO CAPS
600.0000 mg | ORAL_CAPSULE | Freq: Every day | ORAL | Status: DC
  Administered 2021-02-20 – 2021-03-04 (×13): 600 mg via ORAL
  Filled 2021-02-20 (×13): qty 2

## 2021-02-20 MED ORDER — IPRATROPIUM-ALBUTEROL 20-100 MCG/ACT IN AERS
1.0000 | INHALATION_SPRAY | Freq: Three times a day (TID) | RESPIRATORY_TRACT | Status: DC
  Administered 2021-02-20 – 2021-02-22 (×6): 1 via RESPIRATORY_TRACT
  Filled 2021-02-20: qty 4

## 2021-02-20 NOTE — Progress Notes (Addendum)
HOSPITAL MEDICINE OVERNIGHT EVENT NOTE    Notified by nursing that patient is exhibiting progressively worsening hypotension over the past several hours.  Nursing also notes that patient seems to have a moderate chest tube airleak.  Chart reviewed, patient currently has left tension pneumothorax complicated by new diagnosis of tuberculosis.  Obtaining stat chest x-ray to rule out expansion of the pneumothorax.  Providing patient with 500 cc lactated ringer bolus due to nursing reports the patient has been exhibiting poor oral intake with poor urine output as of late.  [name] Elk  MD [facility]   ADDENDUM (3/12 12:15AM)  X-ray reviewed revealing reexpansion of left-sided pneumothorax compared to previous x-ray performed the morning of 3/11.  No mediastinal shift noted.  Additionally, patient continues to exhibit hypotension with systolic blood pressures in the 80s status post 500 cc LR bolus.    Will provide additional 500 cc bolus and ask nursing to notify CT surgery right away as to the reexpansion of the left-sided pneumothorax.  [name] [name]

## 2021-02-20 NOTE — Progress Notes (Signed)
RT note. Pt. Placed on 15L salter at this time, VSS, pt. Resting comfortable, RN made aware of changes. Sat goal of 92% or higher, 85% when sleeping. RT will continue to monitor

## 2021-02-20 NOTE — Progress Notes (Signed)
Occupational Therapy Treatment Patient Details Name: [name] [name] MRN: [id number] DOB: Sep 07, 1956 Today's Date: 02/20/2021    History of present illness 65 y.o. female with admitted from PCP office on 3/3 with shortness of breath, CXR showing pneumothorax, s/p chest tube placement on 3/3. Workup for TB, vs atypical infection, vs malignancy, plan for bronchoscopy on 3/7. Pt with recent fall, compression fracture of T12 (unsure if from fall or not). PMH significant for type 2 diabetes, recent vertebral fracture, tobacco use, hyperlipidemia, hypothyroidism.   OT comments  Pt progressing somewhat towards acute OT goals. Agreeable to sit EOB and complete 3 grooming tasks. Stood 1x to reposition line. Consistently declined any further OOB activity. Question a possible component of depression? Pt on 15 L salter [place] throughout session. O2 sats did intermittently dip into mid 80s with EOB activity then quickly recover with no interventions. Pt also noted to have edema R foot> L foot. D/c plan to venue below.    Follow Up Recommendations  Supervision/Assistance - 24 hour;Home health OT    Equipment Recommendations  3 in 1 bedside commode    Recommendations for Other Services      Precautions / Restrictions Precautions Precautions: Fall Precaution Comments: Chest tube to wall suction, can be to water seal for mobility. On airborne precautions. Heated high flow Restrictions Weight Bearing Restrictions: No       Mobility Bed Mobility Overal bed mobility: Needs Assistance Bed Mobility: Supine to Sit;Sit to Supine     Supine to sit: Min assist Sit to supine: Min guard   General bed mobility comments: Pt reaching for therapist's arm to pull up on to come to EOB position. Min guard to return to supine    Transfers Overall transfer level: Needs assistance Equipment used: 1 person hand held assist Transfers: Sit to/from Stand Sit to Stand: Min assist         General transfer  comment: stood 1x from EOB. min A to steady and HHA    Balance Overall balance assessment: Needs assistance Sitting-balance support: No upper extremity supported;Feet supported Sitting balance-Leahy Scale: Fair Sitting balance - Comments: sitting EOB for grooming tasks with no UE support or LOB   Standing balance support: No upper extremity supported Standing balance-Leahy Scale: Poor Standing balance comment: minA without UE support. stood 1x briefly for OT to reposition a line,                           ADL either performed or assessed with clinical judgement   ADL Overall ADL's : Needs assistance/impaired     Grooming: Oral care;Wash/dry face;Brushing hair;Set up;Sitting;Supervision/safety Grooming Details (indicate cue type and reason): sitting EOB. O2 sats dipping into mid 80s intermittently with EOB activity. On 15L salter.                               General ADL Comments: 3 grooming tasks EOB, stood 1x to reposition a line. Declined all further OOB activity. Discussed bed level exercises and importanct of movement and exercise towards the goal of improved overall health.     Vision       Perception     Praxis      Cognition Arousal/Alertness: Awake/alert Behavior During Therapy: Flat affect Overall Cognitive Status: Impaired/Different from baseline Area of Impairment: Safety/judgement;Awareness;Problem solving  Safety/Judgement: Decreased awareness of safety;Decreased awareness of deficits Awareness: Intellectual Problem Solving: Decreased initiation (cognition vs volition?)          Exercises     Shoulder Instructions       General Comments Pt on 15L [place]ter. Sats noted to intermittently dip into mid 80s during EOB tasks then recover within a few seconds with no interventions.    Pertinent Vitals/ Pain       Pain Assessment: Faces Faces Pain Scale: Hurts little more Pain Location: bilateral  feet, edema noted Pain Descriptors / Indicators: Guarding;Grimacing Pain Intervention(s): Monitored during session;Limited activity within patient's tolerance;Repositioned  Home Living                                          Prior Functioning/Environment              Frequency  Min 2X/week        Progress Toward Goals  OT Goals(current goals can now be found in the care plan section)  Progress towards OT goals: Progressing toward goals  Acute Rehab OT Goals Patient Stated Goal: not stated OT Goal Formulation: With patient Time For Goal Achievement: 02/27/21 Potential to Achieve Goals: Fair ADL Goals Pt Will Perform Grooming: Independently;standing Pt Will Perform Lower Body Bathing: Independently;sit to/from stand Pt Will Perform Lower Body Dressing: Independently;sit to/from stand Pt Will Transfer to Toilet: Independently;regular height toilet;ambulating Pt Will Perform Toileting - Clothing Manipulation and hygiene: Independently;sit to/from stand  Plan Discharge plan remains appropriate;Frequency remains appropriate    Co-evaluation                 AM-PAC OT "6 Clicks" Daily Activity     Outcome Measure   Help from another person eating meals?: None Help from another person taking care of personal grooming?: A Little Help from another person toileting, which includes using toliet, bedpan, or urinal?: A Little Help from another person bathing (including washing, rinsing, drying)?: A Little Help from another person to put on and taking off regular upper body clothing?: None Help from another person to put on and taking off regular lower body clothing?: A Little 6 Click Score: 20    End of Session Equipment Utilized During Treatment: Oxygen (15L O2 salter [place])  OT Visit Diagnosis: Unsteadiness on feet (R26.81)   Activity Tolerance Patient limited by fatigue   Patient Left in bed;with call bell/phone within reach;with bed alarm set    Nurse Communication          Time: 0175-1025 OT Time Calculation (min): 21 min  Charges: OT General Charges $OT Visit: 1 Visit OT Treatments $Self Care/Home Management : 8-22 mins  Raynald Kemp, OT Acute Rehabilitation Services Pager: [phone] Office: [phone]    Pilar Grammes 02/20/2021, 12:37 PM

## 2021-02-20 NOTE — Progress Notes (Signed)
[facility]  Date of Admission:  02/12/2021      Lines:  Peripheral iv's  Abx: 3/11-c rifampin 3/11-c pyrazinamide 3/11-c isoniazid 3/11-c ethambutol  3/08-c amoxicillin-clavulonate                                                            Assessment: Sepsis Upper lobe Cavitary chest lesion left side; bilateral airspace opacity L>>R Left pneumothorax s/p tube thoracostomy 3/03 Thoracic mid back pain  65 yo female [place] smoker, immigrant admitted with 3 weeks at least progressive cough, sob, found to have fever and chest ct left cavitary lesions/bilateral air-space opacity   ddx agree tb/endemic fungi vs malignancy. Superimposed bacterial pna a possibility as well  hiv serology negative S/p bronch 3/07; lul cytology/biopsy sent; purulent secretion noted in airway; 1800 wbc 80% neutrophilic; respiratory cx ngtd; afb smear negative, cx in progress, fungal cx in progress Cryptococcal ag negative; endemic fungi serology in progress  midline thoracic mid back pain due to recent fall/compression fx. The chest ct (albeit poor resolution) doesn't visualize any osseous pyogenic concern so far   ----------- 3/11 assessment fio2 down to 15L HFNC today no fever since amox-clav started 3/09 Pending pathology from BAL and afb stain/cx remains negative; pending rifampin/mtb pcr as well  3/04 afb smear from expectorated sputum just returned 2+AFB on stain. 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 F/u expectorated sputum AFB cx and BAL tb cx and pathology, and rifampin/mtb pcr 2. Continue amox-clav; plan 4 weeks from 3/08 until 4/05 3. Start RIPE therapy -- will adjust based on afb sputum cx sensitivity profile 4. If improvement (decreased cough, no fever) can take off isolation in 2 weeks from RIPE initiation 5. Will need patient to be referred to [facility] for post-discharge DOT tx of TB. 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 amoxicillin-clavulanate  1 tablet Oral Q12H  . atorvastatin  10 mg Oral QPM  . calcitonin ([place])  1 spray Alternating Nares Daily  . cholecalciferol  1,000 Units Oral Daily  . insulin aspart  0-5 Units Subcutaneous QHS  . insulin aspart  0-9 Units Subcutaneous TID WC  . Ipratropium-Albuterol  1 puff Inhalation QID  . levothyroxine  50 mcg Oral QAC breakfast  . lidocaine  1 patch Transdermal Q24H   Continuous Infusions:  PRN Meds:.acetaminophen **OR** acetaminophen, albuterol, morphine injection, traMADol   SUBJECTIVE: o2 down to 15 L hfnc afebrile Minimal cough No n/v/diarrhea/rash  Chest tube still to wall suction  3/04 afb smear positive from expectorated sputum     Review of Systems: ROS Other ros negative  No Known Allergies  OBJECTIVE: Vitals:   02/20/21 0534 02/20/21 0700 02/20/21 0757 02/20/21 0808  BP:  111/68 110/67   Pulse:  (!) 105 (!) 121 (!) 117  Resp:  20 (!) 29 (!) 30  Temp:   98.7 F (37.1 C)   TempSrc:   Oral   SpO2:  100% 97% 98%  Weight: 45.6 kg     Height:       Body mass index is 16.22 kg/m.  Physical Exam Not tachypneic today, conversant short sentences, no distress Heent: normocephalic; per; conj clear Neck supple cv rrr no mrg Lungs tachypneic; rhonci right lung, decreased bs left lung; chest tube in placed serous discharge abd s/nt Neuro nonfocal   Lab Results Lab Results  Component Value Date    WBC 7.2 02/20/2021   HGB 10.5 (L) 02/20/2021   HCT 32.2 (L) 02/20/2021   MCV 79.9 (L) 02/20/2021   PLT 393 02/20/2021    Lab Results  Component Value Date   CREATININE 0.39 (L) 02/20/2021   BUN 9 02/20/2021   NA 127 (L) 02/20/2021   K 3.8 02/20/2021   CL 91 (L) 02/20/2021   CO2 29 02/20/2021    Lab Results  Component Value Date   ALT 16 01/22/2021   AST 22 01/22/2021   ALKPHOS 95 01/22/2021   BILITOT 1.1 01/22/2021     Microbiology: Recent Results (from the past 240 hour(s))  Resp Panel by RT-PCR (Flu A&B, Covid) Nasopharyngeal Swab     Status: None   Collection Time: 02/12/21  3:44 PM   Specimen: Nasopharyngeal Swab; Nasopharyngeal(NP) swabs in vial transport medium  Result Value Ref Range Status   SARS Coronavirus 2 by RT PCR NEGATIVE NEGATIVE Final    Comment: (NOTE) SARS-CoV-2 target nucleic acids are NOT DETECTED.  The SARS-CoV-2 RNA is generally detectable in upper respiratory specimens during the acute phase of infection. The lowest concentration of SARS-CoV-2 viral copies this assay can detect is 138 copies/mL. A negative result does not preclude SARS-Cov-2 infection and should not be used as the sole basis for treatment or other patient management decisions. A negative result may occur with  improper specimen collection/handling, submission of specimen other than nasopharyngeal swab, presence of viral mutation(s) within the areas targeted by this assay, and inadequate number of viral copies(<138 copies/mL). A negative result must be combined with clinical observations, patient history, and epidemiological information. The expected result is Negative.  Fact Sheet for Patients:  EntrepreneurPulse.com.au  Fact Sheet for Healthcare Providers:  IncredibleEmployment.be  This test is no t yet approved or cleared by the [place] FDA and  has been authorized for detection and/or diagnosis of SARS-CoV-2 by FDA under an  Emergency Use Authorization (EUA). This EUA will remain  in effect (meaning this test can be used) for the duration of the COVID-19 declaration under Section 564(b)(1) of the Act, 21 U.S.C.section 360bbb-3(b)(1), unless the authorization is terminated  or revoked sooner.       Influenza A by PCR NEGATIVE NEGATIVE Final   Influenza B by PCR NEGATIVE NEGATIVE Final    Comment: (NOTE) The Xpert Xpress SARS-CoV-2/FLU/RSV plus assay is intended as an aid in the diagnosis of influenza from Nasopharyngeal swab specimens and should not be used as a sole basis for treatment. Nasal washings and aspirates are unacceptable for Xpert Xpress SARS-CoV-2/FLU/RSV testing.  Fact Sheet for Patients: EntrepreneurPulse.com.au  Fact Sheet for Healthcare Providers: IncredibleEmployment.be  This test is not yet approved or cleared by the [place] FDA and has been authorized for detection and/or diagnosis of SARS-CoV-2 by FDA under an Emergency Use Authorization (EUA).  This EUA will remain in effect (meaning this test can be used) for the duration of the COVID-19 declaration under Section 564(b)(1) of the Act, 21 U.S.C. section 360bbb-3(b)(1), unless the authorization is terminated or revoked.  Performed at [facility], [place] [address] [place], [place] [address]   Culture, blood (routine x 2)     Status: None   Collection Time: 02/13/21  2:25 PM   Specimen: BLOOD  Result Value Ref Range Status   Specimen Description BLOOD LEFT ANTECUBITAL  Final   Special Requests   Final    BOTTLES DRAWN AEROBIC AND ANAEROBIC Blood Culture adequate volume   Culture   Final    NO GROWTH 5 DAYS Performed at [facility], [place] [address] [place], [place] [address]    Report Status 02/18/2021 FINAL  Final  Culture, blood (routine x 2)     Status: None   Collection Time: 02/13/21  2:29 PM   Specimen: BLOOD  Result Value Ref Range Status   Specimen Description  BLOOD RIGHT ANTECUBITAL  Final   Special Requests   Final    BOTTLES DRAWN AEROBIC AND ANAEROBIC Blood Culture adequate volume   Culture   Final    NO GROWTH 5 DAYS Performed at [facility], [place] [address] [place], [place] [address]    Report Status 02/18/2021 FINAL  Final  Expectorated Sputum Assessment w Gram Stain, Rflx to Resp Cult     Status: None   Collection Time: 02/13/21  3:13 PM   Specimen: Expectorated Sputum  Result Value Ref Range Status   Specimen Description EXPECTORATED SPUTUM  Final   Special Requests Normal  Final   Sputum evaluation   Final    THIS SPECIMEN IS ACCEPTABLE FOR SPUTUM CULTURE Performed at [facility], [place] [address] [place], [place] [address]    Report Status 02/14/2021 FINAL  Final  Culture, Respiratory w Gram Stain     Status: None   Collection Time: 02/13/21  3:13 PM  Result Value Ref Range Status   Specimen Description EXPECTORATED SPUTUM  Final   Special Requests Normal Reflexed from [id number]  Final   Gram Stain   Final    MODERATE WBC PRESENT,BOTH PMN AND MONONUCLEAR FEW GRAM POSITIVE COCCI RARE GRAM VARIABLE ROD    Culture   Final    MODERATE Normal respiratory flora-no Staph aureus or Pseudomonas seen Performed at [facility], [place] [address] [place], [place] [address]    Report Status 02/16/2021 FINAL  Final  Acid Fast Smear (AFB)     Status: Abnormal   Collection Time: 02/13/21  4:47 PM   Specimen: Sputum  Result Value Ref Range Status   AFB Specimen Processing Concentration  Final   Acid Fast Smear Positive (A)  Final    Comment: (NOTE) 2+, 4-36 acid-fast bacilli per 10 fields at 400X magnification, fluorescent smear REPORTED TO JO W. ON 02-19-21 AT 9:15 PM. FAXED TO [phone]   -CP Performed At: [facility] [place], [place] [id number] Rush Farmer MD [id number]    Source (AFB) SPUTUM  Final    Comment: Performed at [facility], [place] [address], [place], [place]  [address]  Culture, Urine     Status: Abnormal   Collection Time: 02/13/21 10:03 PM   Specimen: Urine, Clean Catch  Result Value Ref Range Status   Specimen Description URINE, CLEAN CATCH  Final   Special Requests NONE  Final  Culture (A)  Final    <10,000 COLONIES/mL INSIGNIFICANT GROWTH Performed at [place] [address] [place], [place] [address]    Report Status 02/15/2021 FINAL  Final  Culture, respiratory     Status: None   Collection Time: 02/16/21 12:12 PM   Specimen: Bronchoalveolar Lavage; Respiratory  Result Value Ref Range Status   Specimen Description BRONCHIAL ALVEOLAR LAVAGE  Final   Special Requests NONE  Final   Gram Stain   Final    FEW WBC PRESENT, PREDOMINANTLY PMN NO ORGANISMS SEEN    Culture   Final    NO GROWTH 2 DAYS Performed at [facility], [address] [place], [place] [address]    Report Status 02/19/2021 FINAL  Final  Acid Fast Smear (AFB)     Status: None   Collection Time: 02/16/21 12:12 PM   Specimen: Bronchial Alveolar Lavage  Result Value Ref Range Status   AFB Specimen Processing Concentration  Final   Acid Fast Smear Negative  Final    Comment: (NOTE) Performed At: [facility] [place], [place] [id number] Rush Farmer MD [id number]    Source (AFB) BRONCHIAL ALVEOLAR LAVAGE  Final    Comment: LUL Performed at [facility], [place] [address] [place], [place] [address]   Fungus Culture With Stain     Status: None (Preliminary result)   Collection Time: 02/16/21 12:12 PM   Specimen: Bronchial Alveolar Lavage  Result Value Ref Range Status   Fungus Stain Final report  Final    Comment: (NOTE) Performed At: [facility] [place], [place] [id number] Rush Farmer MD [id number]    Fungus (Mycology) Culture PENDING  Incomplete   Fungal Source BRONCHIAL ALVEOLAR LAVAGE  Final    Comment: LUL Performed at [facility], [place] [address] [place],  [place] [address]   MTB RIF NAA w/o Culture, Sputum     Status: None (Preliminary result)   Collection Time: 02/16/21 12:12 PM   Specimen: Expectorated Sputum  Result Value Ref Range Status   M Tuberculosis Complex PENDING  Incomplete   Rifampin PENDING  Incomplete   AFB Specimen Processing Direct Inoculation  Final    Comment: (NOTE) Performed At: [facility] [place], [place] [id number] Rush Farmer MD [id number]   Fungus Culture Result     Status: None   Collection Time: 02/16/21 12:12 PM  Result Value Ref Range Status   Result 1 Comment  Final    Comment: (NOTE) KOH/Calcofluor preparation:  no fungus observed. Performed At: [facility] [place], [place] [id number] Rush Farmer MD [id number]   Culture, respiratory     Status: None   Collection Time: 02/16/21 12:48 PM   Specimen: Bronchoalveolar Lavage; Respiratory  Result Value Ref Range Status   Specimen Description BRONCHIAL ALVEOLAR LAVAGE  Final   Special Requests LINGULA  Final   Gram Stain   Final    RARE WBC PRESENT, PREDOMINANTLY PMN NO ORGANISMS SEEN    Culture   Final    NO GROWTH 2 DAYS Performed at [facility], [address] [place], [place] [address]    Report Status 02/19/2021 FINAL  Final  Acid Fast Smear (AFB)     Status: None   Collection Time: 02/16/21 12:48 PM   Specimen: Bronchial Alveolar Lavage  Result Value Ref Range Status   AFB Specimen Processing Concentration  Final   Acid Fast Smear Negative  Final    Comment: (NOTE) Performed At: [facility] [place], [place] [id number] Rush Farmer MD [id number]    Source (AFB) BRONCHIAL ALVEOLAR LAVAGE  Final    Comment: LINGULA Performed at [facility], [place] [address], [place], [place] [address]   Fungus Culture With Stain     Status: None (Preliminary result)   Collection Time: 02/16/21 12:48 PM   Specimen: Bronchial Alveolar Lavage  Result Value Ref  Range Status   Fungus Stain Final report  Final    Comment: (NOTE) Performed At: [facility] [place], [place] [id number] Rush Farmer MD [id number]    Fungus (Mycology) Culture PENDING  Incomplete   Fungal Source BRONCHIAL ALVEOLAR LAVAGE  Final    Comment: LINGULA Performed at [facility], [place] [address], [place], [place] [address]   Fungus Culture Result     Status: None   Collection Time: 02/16/21 12:48 PM  Result Value Ref Range Status   Result 1 Comment  Final    Comment: (NOTE) KOH/Calcofluor preparation:  no fungus observed. Performed At: [facility] [address] [place], [place] [id number] Rush Farmer MD [id number]   MTB RIF NAA w/o Culture, Sputum     Status: None (Preliminary result)   Collection Time: 02/17/21 12:48 PM   Specimen: Expectorated Sputum  Result Value Ref Range Status   M Tuberculosis Complex PENDING  Incomplete   Rifampin PENDING  Incomplete   AFB Specimen Processing Concentration  Final    Comment: (NOTE) Performed At: [facility] [address] [place], [place] [id number] Rush Farmer MD [id number]     Serology: Serology: hiv negative Cryptococcal ag negative Pending endemic fungi serology 3/07 & 3/08 rifampin/mtb pcr pending  Micro: 3/07 BAL fluid LUL  afb stain negative; cx in progress Fungal cx in progress Respiratory culture ngtd Wbc 8867 >73% neutrophilic  Path: 7/36 BAL LUL cytology/biopsy in progress  Imaging: If present, new imagings (plain films, ct scans, and mri) have been personally visualized and interpreted; radiology reports have been reviewed. Decision making incorporated into the Impression / Recommendations.  3/10 cxr 1. Left chest tube in stable position. Reappearance of tiny left basilar pneumothorax may be present on today's examination. No prominent pneumothorax noted. 2. Diffuse left lung infiltrate with left upper lung cavitation again  noted.  3/09 cxr Chest tube remains on the left with interval resolution of left lateral and basilar pneumothorax. Cavitation left upper lobe persists with consolidation throughout much of the left mid and lower lung regions, stable. Equivocal left pleural effusion. Patchy infiltrate on the right stable without progression. Stable cardiac silhouette  3/07 cxr Indwelling left chest pigtail No pneumothorax status post bronchoscopy. Otherwise unchanged  3/03 chest ct 1. Bilateral areas of airspace disease, much more pronounced throughout the left lung with areas of dense consolidation and thick-walled cavities as above. Overall, findings favor atypical infection such as fungal pneumonia or even tuberculosis. Sputum analysis may be useful. Cavitating malignancy cannot be completely excluded, though the widespread disease would be atypical. 2. Multiple subcentimeter solid nodules within the right upper lobe, largest measuring 9 mm. 3. Trace residual left pneumothorax with indwelling left chest tube as above. 4. Trace left pleural effusion. 5. No evidence of pulmonary embolus. 6. Aortic Atherosclerosis  01/22/21 ct lumbar spine 1. Acute biconvex compression deformity of the T12 vertebral body with acute transcortical lucency through the superior and inferior endplates as well as the anterior and posterior cortices technically qualifying  this as a burst fracture (AO spine A4) albeit with only minimal 2 mm of retropulsion of the superior endplate of approximately 2 mm without resulting canal stenosis. No clear extension into the posterior tension band. Mild paravertebral swelling and thickening adjacent the fracture line compatible with an acute injury. Intervertebral gas likely related to intravasation of the degenerative nitrogenous gas phenomenon seen within the adjacent disc spaces. 2. Discogenic and facet degenerative changes throughout the lumbar spine as described  above. 3. Aortic Atherosclerosis   Jabier Mutton, [facility] [phone] pager    02/20/2021, 9:07 AM

## 2021-02-20 NOTE — Progress Notes (Signed)
     [address]       [place] [address]             [phone]      I discussed this case with Dr. Cornelius Moras.  This patient has a very prominent cavitary lesion with a thick rind.  Ultimately she will likely require decortication with muscle flap transposition into the cavity.  This is a very extensive surgery that require a great deal of postoperative management.  I am in the process of contacting [facility] for assistance.  I will provide updates once further information is received.

## 2021-02-20 NOTE — Progress Notes (Signed)
PROGRESS NOTE    [name] [name]  NWG:[id number] DOB: 1955/12/18 DOA: 02/12/2021 PCP: Truett Perna, MD    Brief Narrative:  [name] [name] was admitted to the hospital with a working diagnosis of left tension spontaneous pneumothorax, left upper lobe cavitation, community acquired pneumonia/ TB (present on admission). Acute hypoxemic respiratory failure.   65 year old female past medical history for type 2 diabetes mellitus, recent vertebral fracture, tobacco abuse, dyslipidemia and hypothyroidism. As an outpatient she was diagnosed with pneumothorax. In the ED she was found to have a 90% left tension hydropneumothorax with severe compression atelectasis of the lung medially and marked mediastinal shift. She had a left chest tube placed with good toleration. Her blood pressure was 146/81, heart rate 94, respirate 23, oxygen saturation 98%. She had increased work of breathing, left-sided chest tube in place with decreased breath sounds bilaterally. Heart S1-S2, present rhythmic, soft abdomen, no lower extremity edema.  Sodium 133, potassium 4.3, chloride 97, bicarb 22, glucose 243, BUN 8, creatinine 0.50, white count 9.3, hemoglobin 12.4, hematocrit 38.8, platelets 578. SARS COVID-19 negative.  Chest radiograph with attention left-sided pneumothorax with mediastinal deviation to the right. EKG 118 bpm, normal axis, normal intervals, sinus rhythm, no ST segment T wave changes, positive LVH per EKG criteria.  Follow-up CT chest showed cavitary lesion. She was placed on broad-spectrum antibiotic therapy. Underwent bronchoscopy showing significant amount of purulent secretions on the left side.  She is on respiratory isolation, possible TB.  Not able to clamp chest tube due to recurrent pneumothorax.   Patient with worsening oxygen requirements, placed on heated high flow nasal cannula 30 L/min.   Slow improvement in oxygen requirements, transitioned to 11L per HFNC.  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 Left tension spontaneous hydro-pneumothorax/ community aquired/ atypical pneumonia complicated with severe sepsis (end-organ failure hypoxemia) and acute hypoxemic respiratory failure. NEW TB. Patient with decreased oxygen requirements to 11 L per per HFNC from heated high flow nasal cannula. She has persistent dyspnea, her chest tube has a positive air-leak.  Chest film from today with no recurrent pneumothorax, dense infiltrate left lower lobe with cavitation left upper lobe.   Continue chest tube to suction 20 cm H20.  AFB 2+ consistent with pulmonary TB.   Started on ethambutol, isoniazid, and rifampin. Pyrazinamide supplementation.  Continue with Augmentin for superimposed bacterial infection.   Continue with bronchodilator therapy and airway clearing techniques.  Out of bed to chair tid with meals.   2. Hyponatremia/ metabolic alkalosis. 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 Check urinary Na, urinary osmolality.   Will add fluid restriction and salt tablets 1 g daily.   3. T2DM/ dyslpidemia. serum glucose is 149, continue with sliding scale for glucose cover and monitoring   Continue with atorvastatin.   4. HypothyroidContinue with levothyroxine   5. Moderate calorie protein malnutrition. Continue with nutritional supplements.   6. Thoracic vertebral compression fracture. PRN morphine and tramadol with good toleration.    Patient continue to be at high risk for worsening respiratory failure   Status  is:  Inpatient  Remains inpatient appropriate because:IV treatments appropriate due to intensity of illness or inability to take PO   Dispo: The patient is from: Home              Anticipated d/c is to: Home              Patient currently is not medically stable to d/c.   Difficult to place patient No  DVT prophylaxis: Enoxaparin   Code Status:   full  Family Communication:  No family at the bedside      Nutrition Status: Nutrition Problem: Moderate Malnutrition Etiology: chronic illness (DM) Signs/Symptoms: energy intake < or equal to 75% for > or equal to 1 month,mild fat depletion,moderate fat depletion,mild muscle depletion,moderate muscle depletion Interventions: MVI,Liberalize Diet     Consultants:   Pulmonary   ID   Procedures:   Bronchoscopy   Antimicrobials:   Rifampin, isoniazid, ethambutol  Augmentin     Subjective: Patient with persistent dyspnea, worse with exertion, positive fatigue, no nausea or vomiting, tolerating po well.   Objective: Vitals:   02/20/21 1100 02/20/21 1300 02/20/21 1339 02/20/21 1400  BP: 107/62 119/70    Pulse: (!) 107 (!) 103 (!) 115   Resp: 20 20 (!) 38 (!) 23  Temp: 98.3 F (36.8 C)     TempSrc: Oral     SpO2: 100% 100% 97%   Weight:      Height:        Intake/Output Summary (Last 24 hours) at 02/20/2021 1454 Last data filed at 02/20/2021 1331 Gross per 24 hour  Intake 700 ml  Output 950 ml  Net -250 ml   Filed Weights   02/18/21 0616 02/19/21 0300 02/20/21 0534  Weight: 44.7 kg 44.9 kg 45.6 kg    Examination:   General: positive dyspnea at reset, deconditioned and ill looking appearing  Neurology: Awake and alert, non focal  E ENT: mild pallor, no icterus, oral mucosa moist Cardiovascular: No JVD. S1-S2 present, rhythmic, no gallops, rubs, or murmurs. No lower extremity edema. Pulmonary: positive breath sounds bilaterally, positive diffuse rales and rhonchi, no wheezing Gastrointestinal. Abdomen soft and  non tender Skin. No rashes Musculoskeletal: no joint deformities     Data Reviewed: I have personally reviewed following labs and imaging studies  CBC: Recent Labs  Lab 02/16/21 0459 02/17/21 0432 02/18/21 0353 02/19/21 0211 02/20/21 0232  WBC 10.6* 10.7* 15.7* 9.5 7.2  NEUTROABS  --   --   --  8.3* 6.0  HGB 11.1* 11.3* 11.9* 10.5* 10.5*  HCT 34.4* 35.3* 35.5* 32.3* 32.2*  MCV 80.0 81.1 79.6* 80.0 79.9*  PLT 459* 457* 452* 394 393   Basic Metabolic Panel: Recent Labs  Lab 02/16/21 0459 02/17/21 0432 02/18/21 0353 02/19/21 0211 02/20/21 0232  NA 129* 130* 128* 128* 127*  K 3.8 4.5 4.3 3.6 3.8  CL 92* 92* 90* 91* 91*  CO2 28 28 26 30 29   GLUCOSE 233* 241* 278* 132* 149*  BUN 8 6* 7* 8 9  CREATININE 0.31* 0.39* 0.43* 0.40* 0.39*  CALCIUM 8.5* 8.7* 8.5* 7.9* 8.1*   GFR: Estimated Creatinine Clearance: 52.5 mL/min (A) (by C-G formula based on SCr of 0.39 mg/dL (L)). Liver Function Tests: No results for input(s): AST, ALT, ALKPHOS, BILITOT, PROT, ALBUMIN in the last 168 hours. No results for input(s): LIPASE, AMYLASE in the last 168 hours. No results for input(s): AMMONIA in the last 168 hours. Coagulation Profile: No results for input(s): INR, PROTIME in the  last 168 hours. Cardiac Enzymes: No results for input(s): CKTOTAL, CKMB, CKMBINDEX, TROPONINI in the last 168 hours. BNP (last 3 results) No results for input(s): PROBNP in the last 8760 hours. HbA1C: No results for input(s): HGBA1C in the last 72 hours. CBG: Recent Labs  Lab 02/19/21 1614 02/19/21 1958 02/20/21 0510 02/20/21 0801 02/20/21 1109  GLUCAP 152* 201* 141* 162* 139*   Lipid Profile: No results for input(s): CHOL, HDL, LDLCALC, TRIG, CHOLHDL, LDLDIRECT in the last 72 hours. Thyroid Function Tests: No results for input(s): TSH, T4TOTAL, FREET4, T3FREE, THYROIDAB in the last 72 hours. Anemia Panel: No results for input(s): VITAMINB12, FOLATE, FERRITIN, TIBC, IRON, RETICCTPCT in the last 72  hours.    Radiology Studies: I have reviewed all of the imaging during this hospital visit personally     Scheduled Meds: . amoxicillin-clavulanate  1 tablet Oral Q12H  . atorvastatin  10 mg Oral QPM  . calcitonin (salmon)  1 spray Alternating Nares Daily  . cholecalciferol  1,000 Units Oral Daily  . ethambutol  800 mg Oral Daily  . insulin aspart  0-5 Units Subcutaneous QHS  . insulin aspart  0-9 Units Subcutaneous TID WC  . Ipratropium-Albuterol  1 puff Inhalation TID  . isoniazid  300 mg Oral Daily  . levothyroxine  50 mcg Oral QAC breakfast  . lidocaine  1 patch Transdermal Q24H  . pyrazinamide  1,000 mg Oral Daily  . vitamin B-6  50 mg Oral Daily  . rifampin  600 mg Oral Daily   Continuous Infusions:   LOS: 8 days        Maleea Camilo Annett Gula, MD

## 2021-02-20 NOTE — Progress Notes (Signed)
NAMELlesenia [name], MRN:  [id number], DOB:  Feb 16, 1956, LOS: 8 ADMISSION DATE:  02/12/2021, CONSULTATION DATE:  02/13/2021 REFERRING MD:  Curly Shores MD, CHIEF COMPLAINT: Abnormal CT scan, rule out TB  Brief History:   65 year old [name] [name] with diabetes, vertebral fracture, tobacco use.  Presenting with large left hydropneumothorax s/p chest tube placement in the ED. CT shows airspace consolidation, cavitary lesions left greater than right.  She is febrile today prompting placement in airborne isolation due to concern for TB and PCCM consultation for bronchoscopy.  Complains of chronic cough, occasional sputum production.  Past Medical History:   Diabetes, hyperlipidemia, thyroid disease  Social history notable for 15-pack-year smoker.  Quit in 2020.  She denies exposure to TB, no being homeless or incarcerated. She is an immigrant from [place] over 20 years ago but still travels back every year.  Used to work as a Corporate treasurer.  Denies any significant occupational or home exposures  She has about 15 kg weight loss over 3 months which she attributes to her recent fall and vertebral compression fracture, no loss of appetite.  Denies hemoptysis.  Significant Hospital Events:   3/3-admit with pneumothorax, chest tube placement on the left  Consults:   Cardiothoracic surgery PCCM  ID 3/11  Procedures:  3/3 left chest tube (TCTS managing) 3/6 Bronch   Significant Diagnostic Tests:   CTA 02/12/2021-bilateral airspace disease with dense consolidation, cavitary lesions of the left, dense airspace consolidation of the lower lobe, multiple small pulmonary nodules.  Micro Data:  3/3 SARS/ flu >> neg 3/4 AFB expectorated >> neg 3/4 AFB >> positive >> 3/4 UCx >> insignificant growth 3/4 sputum cx >> normal flora 3/4 BCx >> neg 3/7 AFB BAL >> neg 3/7 BAL >> ngtd >> 3/7 fungal BAL >>  3/8 MTB RIF >>  Antimicrobials:  3/8 augmentin >> 3/11 RIPE - ethambutol, INH,  pyrazinamide, rifampin  Interim History / Subjective:   CT with small air leak- TCTS leaving to suction for now CXR stable, PTX resolved  AFB positive for TB  Objective   Blood pressure 137/87, pulse (!) 115, temperature 98.7 F (37.1 C), temperature source Oral, resp. rate 20, height _0  (1.676 m), weight 45.6 kg, SpO2 100 %.    FiO2 (%):  [50 %] 50 %   Intake/Output Summary (Last 24 hours) at 02/20/2021 1031 Last data filed at 02/20/2021 0625 Gross per 24 hour  Intake 735 ml  Output 1180 ml  Net -445 ml   Filed Weights   02/18/21 0616 02/19/21 0300 02/20/21 0534  Weight: 44.7 kg 44.9 kg 45.6 kg    Examination:  Blood pressure 137/87, pulse (!) 115, temperature 98.7 F (37.1 C), temperature source Oral, resp. rate 20, height _1  (1.676 m), weight 45.6 kg, SpO2 100 %.  General:  Thin older female lying in bed in NAD HEENT: MM pink/dry Neuro: Alert, oriented, appears fatigued, MAE CV: rr, ST, no murmur PULM:  Non labored, tachypneic, speaking full sentences, remains on salter HFNC 15 liters, right clear, left diminished, left CT in place to 20cm sxn with 3/7 airleak GI: soft, bs active  Extremities: warm/dry, trace pedal edema  Skin: no rashes    Resolved Hospital Problem list     Assessment & Plan:  Left lung consolidation, cavitation with bilateral airspace disease with AFB + for TB with superimposed bacterial pneumonia Bronchoscopy performed 3/6 Cryptococcal ag is negative.  Histoplasma neg P:  Awaiting culture and pathology results. Neutrophil predominant BALs on  cell count/differential. No organisms noted on gram stain. Continue augmentin, started 3/8 Acid fast smears are negative x2.  02/13/21  expectorated AFB positive, pending sensitivity and sputum cx  Remains on airborne precautions  ID starting RIPE therapy 3/11-  ethambutol, INH, pyrazinamide, rifampin Quantiferon gold is pending as of 3/11 Blastomyces ag is pending. Prn BD combivent TID Ongoing  pulmonary hygiene   Left pneumothorax s/p chest tube Clamp trial failed on 3/8 Per TCTS  Continue chest tube to suction, small air leak noted 3/11, may require larger CT vs surgical given cavitation  Best practice (evaluated daily)  Per primary  Labs   CBC: Recent Labs  Lab 02/16/21 0459 02/17/21 0432 02/18/21 0353 02/19/21 0211 02/20/21 0232  WBC 10.6* 10.7* 15.7* 9.5 7.2  NEUTROABS  --   --   --  8.3* 6.0  HGB 11.1* 11.3* 11.9* 10.5* 10.5*  HCT 34.4* 35.3* 35.5* 32.3* 32.2*  MCV 80.0 81.1 79.6* 80.0 79.9*  PLT 459* 457* 452* 394 449    Basic Metabolic Panel: Recent Labs  Lab 02/16/21 0459 02/17/21 0432 02/18/21 0353 02/19/21 0211 02/20/21 0232  NA 129* 130* 128* 128* 127*  K 3.8 4.5 4.3 3.6 3.8  CL 92* 92* 90* 91* 91*  CO2 _0 GLUCOSE 233* 241* 278* 132* 149*  BUN 8 6* 7* 8 9  CREATININE 0.31* 0.39* 0.43* 0.40* 0.39*  CALCIUM 8.5* 8.7* 8.5* 7.9* 8.1*   GFR: Estimated Creatinine Clearance: 52.5 mL/min (A) (by C-G formula based on SCr of 0.39 mg/dL (L)). Recent Labs  Lab 02/17/21 0432 02/18/21 0353 02/19/21 0211 02/20/21 0232  WBC 10.7* 15.7* 9.5 7.2    Liver Function Tests: No results for input(s): AST, ALT, ALKPHOS, BILITOT, PROT, ALBUMIN in the last 168 hours. No results for input(s): LIPASE, AMYLASE in the last 168 hours. No results for input(s): AMMONIA in the last 168 hours.  ABG No results found for: PHART, PCO2ART, PO2ART, HCO3, TCO2, ACIDBASEDEF, O2SAT   Coagulation Profile: No results for input(s): INR, PROTIME in the last 168 hours.  Cardiac Enzymes: No results for input(s): CKTOTAL, CKMB, CKMBINDEX, TROPONINI in the last 168 hours.  HbA1C: Hgb A1c MFr Bld  Date/Time Value Ref Range Status  02/13/2021 03:57 AM 10.8 (H) 4.8 - 5.6 % Final    Comment:    (NOTE) Pre diabetes:          5.7%-6.4%  Diabetes:              >6.4%  Glycemic control for   <7.0% adults with diabetes   03/07/2020 11:03 AM 13.7 (H) 4.6 - 6.5 %  Final    Comment:    Glycemic Control Guidelines for People with Diabetes:Non Diabetic:  <6%Goal of Therapy: <7%Additional Action Suggested:  >8%     CBG: Recent Labs  Lab 02/19/21 1124 02/19/21 1614 02/19/21 1958 02/20/21 0510 02/20/21 0801  GLUCAP 139* 152* 201* 141* 162*    Signature:     Kennieth Rad, ACNP [facility] 02/20/2021, 10:31 AM

## 2021-02-20 NOTE — Progress Notes (Signed)
PT Cancellation Note  Patient Details Name: [name] [name] MRN: [id number] DOB: 18-Mar-1956   Cancelled Treatment:    Reason Eval/Treat Not Completed: Medical issues which prohibited therapy. Per discussion with RN the patient is having increased difficulty breathing, awaiting transition back to heated high flow nasal canula. RN requests PT hold at this time. PT will follow up when the patient is more medically appropriate to mobilize.   Arlyss Gandy 02/20/2021, 3:59 PM

## 2021-02-20 NOTE — Progress Notes (Signed)
Pt complaining of dizziness/lightheadedness. Pain 10/10. Labored breathing and tachypneic. Pleural chest tube moderate air leak with 220 output. Charge nurse notified and provider paged. LR 500 ml bolus and chest x-ray ordered.    02/20/21 2100  Assess: MEWS Score  BP (!) 83/61  Pulse Rate (!) 120  ECG Heart Rate (!) 121  Resp (!) 25  Level of Consciousness Alert  SpO2 93 %  O2 Device HFNC  Patient Activity (if Appropriate) In bed  O2 Flow Rate (L/min) 20 L/min  FiO2 (%) 50 %  Assess: MEWS Score  MEWS Temp 0  MEWS Systolic 1  MEWS Pulse 2  MEWS RR 1  MEWS LOC 0  MEWS Score 4  MEWS Score Color Red  Take Vital Signs  Increase Vital Sign Frequency  Red: Q 1hr X 4 then Q 4hr X 4, if remains red, continue Q 4hrs  Escalate  MEWS: Escalate Red: discuss with charge nurse/RN and provider, consider discussing with RRT  Notify: Charge Nurse/RN  Name of Charge Nurse/RN Notified Lyn  Date Charge Nurse/RN Notified 02/20/21  Time Charge Nurse/RN Notified 2050  Notify: Provider  Provider Name/Title Dr. Martyn Malay  Date Provider Notified 02/20/21  Time Provider Notified 2100  Notification Type Page  Notification Reason Change in status  Document  Progress note created (see row info) Yes

## 2021-02-20 NOTE — Progress Notes (Addendum)
[address]       [place] [id number]             [phone]       4 Days Post-Op Procedure(s) (LRB): VIDEO BRONCHOSCOPY WITH FLUORO (Left) BRONCHIAL BIOPSIES BRONCHIAL BRUSHINGS BRONCHIAL WASHINGS  Subjective: Resting in bed. Denies any pain this morning and said her breathing is about the same.    Objective: Vital signs in last 24 hours: Temp:  [97.6 F (36.4 C)-98.7 F (37.1 C)] 98.7 F (37.1 C) (03/11 0757) Pulse Rate:  [99-126] 117 (03/11 0808) Cardiac Rhythm: Sinus tachycardia (03/11 0740) Resp:  [20-41] 30 (03/11 0808) BP: (110-135)/(67-78) 110/67 (03/11 0757) SpO2:  [91 %-100 %] 98 % (03/11 0808) FiO2 (%):  [50 %] 50 % (03/10 2016) Weight:  [45.6 kg] 45.6 kg (03/11 0534)      Intake/Output from previous day: 03/10 0701 - 03/11 0700 In: 1588.8 [P.O.:988; I.V.:600.8] Out: 1180 [Urine:1050; Chest Tube:130]   Physical Exam:  Cardiovascular: HR 110-130 Pulmonary: Clear to auscultation on right and diminished on left, unchanged.  CXR this AM showing no PTX, otherwise unchanged.  Abdomen: Soft, non tender Extremities: Mild bilateral lower extremity edema. Chest Tube: to suction, has a small air leak with cough.   Lab Results: CBC: Recent Labs    02/19/21 0211 02/20/21 0232  WBC 9.5 7.2  HGB 10.5* 10.5*  HCT 32.3* 32.2*  PLT 394 393   BMET:  Recent Labs    02/19/21 0211 02/20/21 0232  NA 128* 127*  K 3.6 3.8  CL 91* 91*  CO2 30 29  GLUCOSE 132* 149*  BUN 8 9  CREATININE 0.40* 0.39*  CALCIUM 7.9* 8.1*    EXAM: PORTABLE CHEST 1 VIEW  COMPARISON:  02/19/2021 portable chest and earlier.  FINDINGS: Portable AP semi upright view at 0605 hours. Left apical cavitary lesion appears stable along with Patchy and confluent left lower lung opacity. Stable left chest tube. No definite pneumothorax today. Stable cardiac size and mediastinal contours. Reticulonodular opacity throughout the right lung is stable. Visualized  tracheal air column is within normal limits. Ventilation not significantly changed since 02/18/2009. Stable visualized osseous structures. Negative visible bowel gas.  IMPRESSION: 1. Stable left lung and left chest tube. No pneumothorax today. 2. Continued reticulonodular opacity throughout the right lung. 3. No new cardiopulmonary abnormality.   Electronically Signed   By: Odessa Fleming M.D.   On: 02/20/2021 08:33   Assessment/Plan:   -64yo former smoker with 15kg Wt loss over the past 3 months admitted with a left tension PTX that has resolved with insertion of a left pigtail pleural tube by the ED physician.  Follow up CT scan shows the left upper lobe and superior segment of the left lower lobe are replace with a thick-walled cavity.  Additionally, there are multiple solid nodules in the right upper lobe.  Pulmonary medicine consulted and bronchoscopy was done on 3/7. Purulent secretions noted in RUL bronchus.  Brushings, biopsies, and cultures obtained. AFB smear from expectorated sputm now positive.  Pt has TB with superimposed pneumonia per ID. Multidrug therapy initiated.    -Recurrent PTX early morning 3/9 that resolved with placing the CT back to suction. Has a small active air leak with cough, will leave the CT to suction for now.    Dale Edison 02/20/2021,9:43 AM [id number]    I have seen and examined the patient and agree with the assessment and plan as outlined.  Leave chest  tube in place.  May ultimately require placement of larger chest tube and/or thoracoplasty to facilitate long term management if air leak doesn't resolve soon.  Given the presence of active TB infection with large cavitary destruction of the LUL, more definitive surgical intervention may be required.  Will ask one of my partners to review and continue to follow.   Purcell Nails, MD 02/20/2021 10:36 AM

## 2021-02-21 ENCOUNTER — Inpatient Hospital Stay (HOSPITAL_COMMUNITY): Payer: BLUE CROSS/BLUE SHIELD

## 2021-02-21 DIAGNOSIS — J939 Pneumothorax, unspecified: Secondary | ICD-10-CM | POA: Diagnosis not present

## 2021-02-21 DIAGNOSIS — S22080D Wedge compression fracture of T11-T12 vertebra, subsequent encounter for fracture with routine healing: Secondary | ICD-10-CM | POA: Diagnosis not present

## 2021-02-21 DIAGNOSIS — E440 Moderate protein-calorie malnutrition: Secondary | ICD-10-CM | POA: Diagnosis not present

## 2021-02-21 DIAGNOSIS — A159 Respiratory tuberculosis unspecified: Secondary | ICD-10-CM

## 2021-02-21 DIAGNOSIS — J984 Other disorders of lung: Secondary | ICD-10-CM | POA: Diagnosis not present

## 2021-02-21 LAB — GLUCOSE, CAPILLARY
Glucose-Capillary: 261 mg/dL — ABNORMAL HIGH (ref 70–99)
Glucose-Capillary: 316 mg/dL — ABNORMAL HIGH (ref 70–99)
Glucose-Capillary: 363 mg/dL — ABNORMAL HIGH (ref 70–99)
Glucose-Capillary: 407 mg/dL — ABNORMAL HIGH (ref 70–99)

## 2021-02-21 LAB — BASIC METABOLIC PANEL
Anion gap: 9 (ref 5–15)
BUN: 15 mg/dL (ref 8–23)
CO2: 27 mmol/L (ref 22–32)
Calcium: 8.5 mg/dL — ABNORMAL LOW (ref 8.9–10.3)
Chloride: 91 mmol/L — ABNORMAL LOW (ref 98–111)
Creatinine, Ser: 0.53 mg/dL (ref 0.44–1.00)
GFR, Estimated: >60 mL/min (ref >60)
Glucose, Bld: 276 mg/dL — ABNORMAL HIGH (ref 70–99)
Potassium: 3.9 mmol/L (ref 3.5–5.1)
Sodium: 127 mmol/L — ABNORMAL LOW (ref 135–145)

## 2021-02-21 LAB — OSMOLALITY: Osmolality: 279 mOsm/kg (ref 275–295)

## 2021-02-21 MED ORDER — ALPRAZOLAM 0.5 MG PO TABS
0.5000 mg | ORAL_TABLET | Freq: Three times a day (TID) | ORAL | Status: DC | PRN
  Administered 2021-02-21 – 2021-03-03 (×9): 0.5 mg via ORAL
  Filled 2021-02-21 (×10): qty 1

## 2021-02-21 MED ORDER — LACTATED RINGERS IV BOLUS
500.0000 mL | Freq: Once | INTRAVENOUS | Status: AC
  Administered 2021-02-21: 500 mL via INTRAVENOUS

## 2021-02-21 NOTE — Progress Notes (Signed)
Paged Dr. Ella Jubilee to advise that patient refuses insulin and CBG is 316. Additionally, resp rate in 40s-50s.

## 2021-02-21 NOTE — Progress Notes (Signed)
PROGRESS NOTE    [name] [name]  QIH:[id number] DOB: 05/17/1956 DOA: 02/12/2021 PCP: Truett Perna, MD    Brief Narrative:  [name] was admitted to the hospital with a working diagnosis of left tension spontaneous pneumothorax, left upper lobe cavitation, community acquired pneumonia/ TB (present on admission). Acute hypoxemic respiratory failure.  65 year old female past medical history for type 2 diabetes mellitus, recent vertebral fracture, tobacco abuse, dyslipidemia and hypothyroidism. As an outpatient she was diagnosed with pneumothorax. In the ED she was found to have a 90% left tension hydropneumothorax with severe compression atelectasis of the lung medially and marked mediastinal shift. She had a left chest tube placed with good toleration. Her blood pressure was 146/81, heart rate 94, respirate 23, oxygen saturation 98%. She had increased work of breathing, left-sided chest tube in place with decreased breath sounds bilaterally. Heart S1-S2, present rhythmic, soft abdomen, no lower extremity edema.  Sodium 133, potassium 4.3, chloride 97, bicarb 22, glucose 243, BUN 8, creatinine 0.50, white count 9.3, hemoglobin 12.4, hematocrit 38.8, platelets 578. SARS COVID-19 negative.  Chest radiograph with attention left-sided pneumothorax with mediastinal deviation to the right. EKG 118 bpm, normal axis, normal intervals, sinus rhythm, no ST segment T wave changes, positive LVH per EKG criteria.  Follow-up CT chest showed cavitary lesion. She was placed on broad-spectrum antibiotic therapy. Underwent bronchoscopy showing significant amount of purulent secretions on the left side.  She is on respiratory isolation, possible TB.  Not able to clamp chest tube due to recurrent pneumothorax.   Patient with worsening oxygen requirements, placed on heated high flow nasal cannula 30 L/min.  Slow improvement in oxygen requirements, transitioned to 11L per HFNC.  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   Patient with hypotension, and worsening hypoxemia, back on heated high flow nasal cannula. Started on TB antibiotic regimen.   Persistent pneumothorax, not able to clamp chest tube, patient will eventually need pulmonary decortication and muscle flap transposition into the cavity.    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 Left tension spontaneous hydro-pneumothorax/ community aquired/ atypical pneumonia complicated with severe sepsis (end-organ failure hypoxemia) and acute hypoxemic respiratory failure.NEW TB. Last night patient with hypotension, received IV fluids resuscitation with good toleration.  Back on heated high flow nasal cannula 20 L/min with Fi02 50%.  Respiratory rate 38 to 44.   Chest tube to suction at 20 cm H20, continue to have an air leak.   Chest film with persistent left lower pneumothorax, mild improvement from yesterday, dense infiltrate on the left lower lobe and large cavitary lesion on the left upper lobe. Right with diffuse bilateral interstitial infiltrates.   Plan to keep heated high flow nasal cannula that has improved her work of breathing.  Bronchodilators and airway clearing techniques.   Antibiotic therapy with: Ethambutol, isoniazid, and rifampin. Pyrazinamide supplementation.   Augmentin for superimposed bacterial infection.   Will try to avoid non invasive mechanica ventilation due to pneumothorax with air leak. Continue as needed morphine for pain control and will add low dose alprazolam for anxiety.   2. 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  Hypo-osmolar hyponatremia, not available urine electrolytes  Continue with salt tablets and oral fluid restriction as tolerated.  Hold on IV fluids for now.   This an blood pressure down 96/62 mmHg. Will keep a MAP of 65, and check echocardiogram.   3. T2DM uncontrolled with hyperglycemia/ dyslpidemia. Fasting glucose 276.  Continue glucose cover and monitoring with insulin sliding scale. Patient with poor oral intake.   Onatorvastatin.   4. HypothyroidOn levothyroxine   5. Moderate calorie protein malnutrition.On nutritional supplements.   6. Thoracic vertebral compression fracture.Continue with PRN morphine and tramadol.   Patient continue to be at high risk for worsening respiratory failure   Status is: Inpatient  Remains inpatient appropriate because:IV treatments appropriate due to intensity of illness or inability to take PO   Dispo: The patient is from: Home              Anticipated d/c is to: Home              Patient currently is not medically stable to d/c.   Difficult to place patient No   DVT prophylaxis: Enoxaparin   Code Status:   full  Family Communication:  I was not able to reach any family at [phone].     Nutrition Status: Nutrition Problem: Moderate Malnutrition Etiology: chronic illness (DM) Signs/Symptoms: energy intake < or equal to 75% for > or equal to 1 month,mild fat depletion,moderate fat depletion,mild muscle depletion,moderate muscle depletion Interventions: MVI,Liberalize Diet      Consultants:   Pulmonary   ID   Procedures:   Bronchoscopy   Antimicrobials:   Augmentin   Ethambutol, isoniazid, rifampin    Subjective: Patient hypotensive last night, this am very weak and deconditioned, continue to have dyspnea but improved with heated high flow nasal cannula, denies any chest pain, no nausea or vomiting. Positive poor oral intake.   Objective: Vitals:    02/21/21 0330 02/21/21 0500 02/21/21 0515 02/21/21 0528  BP: (!) 87/63 96/62    Pulse: 99 (!) 103  (!) 106  Resp: (!) 28 (!) 35  (!) 38  Temp:      TempSrc:      SpO2: 93% 93%  93%  Weight:   45.7 kg   Height:        Intake/Output Summary (Last 24 hours) at 02/21/2021 0825 Last data filed at 02/21/2021 0500 Gross per 24 hour  Intake 220 ml  Output 510 ml  Net -290 ml   Filed Weights   02/19/21 0300 02/20/21 0534 02/21/21 0515  Weight: 44.9 kg 45.6 kg 45.7 kg    Examination:   General: deconditioned and ill looking appearing, positive dyspnea at rest Neurology: Awake and alert, non focal  E ENT: positive pallor, no icterus, oral mucosa moist Cardiovascular: No JVD. S1-S2 present, rhythmic, no gallops, rubs, or murmurs. No lower extremity edema. Pulmonary: positive breath sounds bilaterally, decreased ventilation at left upper lobe, with no wheezing, but diffuse bilateral rhonchi and rales. Gastrointestinal. Abdomen soft and non tender Skin. No rashes Musculoskeletal: no joint deformities     Data Reviewed: I have personally reviewed following labs and imaging studies  CBC: Recent Labs  Lab 02/16/21 0459 02/17/21 0432 02/18/21 0353 02/19/21 0211 02/20/21 0232  WBC 10.6* 10.7* 15.7* 9.5 7.2  NEUTROABS  --   --   --  8.3* 6.0  HGB 11.1* 11.3* 11.9* 10.5* 10.5*  HCT 34.4* 35.3* 35.5* 32.3* 32.2*  MCV 80.0 81.1 79.6* 80.0 79.9*  PLT 459* 457* 452* 394  393   Basic Metabolic Panel: Recent Labs  Lab 02/17/21 0432 02/18/21 0353 02/19/21 0211 02/20/21 0232 02/21/21 0416  NA 130* 128* 128* 127* 127*  K 4.5 4.3 3.6 3.8 3.9  CL 92* 90* 91* 91* 91*  CO2 28 26 30 29 27   GLUCOSE 241* 278* 132* 149* 276*  BUN 6* 7* 8 9 15   CREATININE 0.39* 0.43* 0.40* 0.39* 0.53  CALCIUM 8.7* 8.5* 7.9* 8.1* 8.5*   GFR: Estimated Creatinine Clearance: 52.6 mL/min (by C-G formula based on SCr of 0.53 mg/dL). Liver Function Tests: No results for input(s): AST, ALT, ALKPHOS, BILITOT,  PROT, ALBUMIN in the last 168 hours. No results for input(s): LIPASE, AMYLASE in the last 168 hours. No results for input(s): AMMONIA in the last 168 hours. Coagulation Profile: No results for input(s): INR, PROTIME in the last 168 hours. Cardiac Enzymes: No results for input(s): CKTOTAL, CKMB, CKMBINDEX, TROPONINI in the last 168 hours. BNP (last 3 results) No results for input(s): PROBNP in the last 8760 hours. HbA1C: No results for input(s): HGBA1C in the last 72 hours. CBG: Recent Labs  Lab 02/20/21 0801 02/20/21 1109 02/20/21 1656 02/20/21 2046 02/21/21 0523  GLUCAP 162* 139* 104* 181* 261*   Lipid Profile: No results for input(s): CHOL, HDL, LDLCALC, TRIG, CHOLHDL, LDLDIRECT in the last 72 hours. Thyroid Function Tests: No results for input(s): TSH, T4TOTAL, FREET4, T3FREE, THYROIDAB in the last 72 hours. Anemia Panel: No results for input(s): VITAMINB12, FOLATE, FERRITIN, TIBC, IRON, RETICCTPCT in the last 72 hours.    Radiology Studies: I have reviewed all of the imaging during this hospital visit personally     Scheduled Meds: . amoxicillin-clavulanate  1 tablet Oral Q12H  . atorvastatin  10 mg Oral QPM  . calcitonin (salmon)  1 spray Alternating Nares Daily  . cholecalciferol  1,000 Units Oral Daily  . ethambutol  800 mg Oral Daily  . insulin aspart  0-5 Units Subcutaneous QHS  . insulin aspart  0-9 Units Subcutaneous TID WC  . Ipratropium-Albuterol  1 puff Inhalation TID  . isoniazid  300 mg Oral Daily  . levothyroxine  50 mcg Oral QAC breakfast  . lidocaine  1 patch Transdermal Q24H  . pyrazinamide  1,000 mg Oral Daily  . vitamin B-6  50 mg Oral Daily  . rifampin  600 mg Oral Daily  . sodium chloride  1 g Oral TID WC   Continuous Infusions:   LOS: 9 days        Kemba Hoppes 2047, MD

## 2021-02-21 NOTE — Progress Notes (Signed)
5 Days Post-Op Procedure(s) (LRB): VIDEO BRONCHOSCOPY WITH FLUORO (Left) BRONCHIAL BIOPSIES BRONCHIAL BRUSHINGS BRONCHIAL WASHINGS Subjective: Short of breath  Objective: Vital signs in last 24 hours: Pulse Rate:  [99-131] 107 (03/12 1100) Cardiac Rhythm: Normal sinus rhythm (03/12 0717) Resp:  [19-47] 38 (03/12 0839) BP: (78-132)/(55-79) 99/68 (03/12 1100) SpO2:  [92 %-99 %] 93 % (03/12 0528) FiO2 (%):  [50 %] 50 % (03/12 0842) Weight:  [45.7 kg] 45.7 kg (03/12 0515)  Hemodynamic parameters for last 24 hours:    Intake/Output from previous day: 03/11 0701 - 03/12 0700 In: 220 [P.O.:220] Out: 510 [Urine:250; Chest Tube:260] Intake/Output this shift: Total I/O In: 540 [P.O.:540] Out: 40 [Chest Tube:40]  General appearance: alert, cooperative and tachypneic  Heart: regular rate and rhythm Lungs: diminished breath sounds left chest chest tube is kinked and she is laying on it. when straightened there is a 5 chamber large air leak  Lab Results: Recent Labs    02/19/21 0211 02/20/21 0232  WBC 9.5 7.2  HGB 10.5* 10.5*  HCT 32.3* 32.2*  PLT 394 393   BMET:  Recent Labs    02/20/21 0232 02/21/21 0416  NA 127* 127*  K 3.8 3.9  CL 91* 91*  CO2 29 27  GLUCOSE 149* 276*  BUN 9 15  CREATININE 0.39* 0.53  CALCIUM 8.1* 8.5*    PT/INR: No results for input(s): LABPROT, INR in the last 72 hours. ABG No results found for: PHART, HCO3, TCO2, ACIDBASEDEF, O2SAT CBG (last 3)  Recent Labs    02/20/21 2046 02/21/21 0523 02/21/21 1214  GLUCAP 181* 261* 316*    Assessment/Plan:  Cavitary TB of left lung with large air leak and ptx.The pigtail catheter is in adequate position but gets kinked off and then non-functional. I taped it to patient's chest wall so that hopefully it will not get kinked. Followup CXR in am. I am not sure what the ultimate plan will be for her. Dr. Cliffton Asters was discussing with [facility] but I think she is a poor candidate for any surgical treatment  with poor nutritional state, frailty, poorly controlled DM.   LOS: 9 days    Alleen Borne 02/21/2021

## 2021-02-22 DIAGNOSIS — J939 Pneumothorax, unspecified: Secondary | ICD-10-CM | POA: Diagnosis not present

## 2021-02-22 DIAGNOSIS — S22080D Wedge compression fracture of T11-T12 vertebra, subsequent encounter for fracture with routine healing: Secondary | ICD-10-CM | POA: Diagnosis not present

## 2021-02-22 DIAGNOSIS — J984 Other disorders of lung: Secondary | ICD-10-CM | POA: Diagnosis not present

## 2021-02-22 DIAGNOSIS — A150 Tuberculosis of lung: Secondary | ICD-10-CM

## 2021-02-22 LAB — BASIC METABOLIC PANEL
Anion gap: 10 (ref 5–15)
BUN: 27 mg/dL — ABNORMAL HIGH (ref 8–23)
CO2: 24 mmol/L (ref 22–32)
Calcium: 8.5 mg/dL — ABNORMAL LOW (ref 8.9–10.3)
Chloride: 93 mmol/L — ABNORMAL LOW (ref 98–111)
Creatinine, Ser: 0.58 mg/dL (ref 0.44–1.00)
GFR, Estimated: >60 mL/min (ref >60)
Glucose, Bld: 359 mg/dL — ABNORMAL HIGH (ref 70–99)
Potassium: 4.4 mmol/L (ref 3.5–5.1)
Sodium: 127 mmol/L — ABNORMAL LOW (ref 135–145)

## 2021-02-22 LAB — CBC WITH DIFFERENTIAL/PLATELET
Abs Immature Granulocytes: 0.47 10*3/uL — ABNORMAL HIGH (ref 0.00–0.07)
Basophils Absolute: 0.1 10*3/uL (ref 0.0–0.1)
Basophils Relative: 1 %
Eosinophils Absolute: 0 10*3/uL (ref 0.0–0.5)
Eosinophils Relative: 0 %
HCT: 36 % (ref 36.0–46.0)
Hemoglobin: 12.2 g/dL (ref 12.0–15.0)
Immature Granulocytes: 4 %
Lymphocytes Relative: 6 %
Lymphs Abs: 0.6 10*3/uL — ABNORMAL LOW (ref 0.7–4.0)
MCH: 26.3 pg (ref 26.0–34.0)
MCHC: 33.9 g/dL (ref 30.0–36.0)
MCV: 77.8 fL — ABNORMAL LOW (ref 80.0–100.0)
Monocytes Absolute: 0.7 10*3/uL (ref 0.1–1.0)
Monocytes Relative: 7 %
Neutro Abs: 9.4 10*3/uL — ABNORMAL HIGH (ref 1.7–7.7)
Neutrophils Relative %: 82 %
Platelets: 433 10*3/uL — ABNORMAL HIGH (ref 150–400)
RBC: 4.63 MIL/uL (ref 3.87–5.11)
RDW: 14.1 % (ref 11.5–15.5)
WBC: 11.4 10*3/uL — ABNORMAL HIGH (ref 4.0–10.5)
nRBC: 0 % (ref 0.0–0.2)

## 2021-02-22 LAB — GLUCOSE, CAPILLARY
Glucose-Capillary: 334 mg/dL — ABNORMAL HIGH (ref 70–99)
Glucose-Capillary: 199 mg/dL — ABNORMAL HIGH (ref 70–99)
Glucose-Capillary: 155 mg/dL — ABNORMAL HIGH (ref 70–99)
Glucose-Capillary: 118 mg/dL — ABNORMAL HIGH (ref 70–99)

## 2021-02-22 MED ORDER — IPRATROPIUM-ALBUTEROL 20-100 MCG/ACT IN AERS
1.0000 | INHALATION_SPRAY | Freq: Two times a day (BID) | RESPIRATORY_TRACT | Status: DC
  Filled 2021-02-22: qty 4

## 2021-02-22 MED ORDER — INSULIN GLARGINE 100 UNIT/ML ~~LOC~~ SOLN
10.0000 [IU] | Freq: Every day | SUBCUTANEOUS | Status: DC
  Administered 2021-02-22 – 2021-03-04 (×11): 10 [IU] via SUBCUTANEOUS
  Filled 2021-02-22 (×11): qty 0.1

## 2021-02-22 NOTE — Progress Notes (Signed)
   02/21/21 1955  Assess: MEWS Score  BP (!) 74/59  Pulse Rate (!) 102  Resp (!) 43  SpO2 90 %  O2 Device HFNC  Heater temperature 95 F (35 C)  O2 Flow Rate (L/min) 20 L/min  FiO2 (%) 40 %  Assess: MEWS Score  MEWS Temp 0  MEWS Systolic 2  MEWS Pulse 1  MEWS RR 3  MEWS LOC 0  MEWS Score 6  MEWS Score Color Red  Assess: if the MEWS score is Yellow or Red  Were vital signs taken at a resting state? Yes  Focused Assessment No change from prior assessment  Early Detection of Sepsis Score *See Row Information* Low  MEWS guidelines implemented *See Row Information* No, previously red, continue vital signs every 4 hours  Document  Patient Outcome Not stable and remains on department  Progress note created (see row info) Yes

## 2021-02-22 NOTE — Progress Notes (Signed)
MD, pt continues refuse her insulin despite numerous explanations on how important this is for her health, and the importance of keeping your CBG's at a good level, will continue monitor, thanks Lavonda Jumbo RN.

## 2021-02-22 NOTE — Progress Notes (Signed)
   02/21/21 2100  Assess: MEWS Score  Temp 98.4 F (36.9 C)  BP 92/70  Pulse Rate (!) 102  ECG Heart Rate (!) 103  Resp (!) 45  SpO2 92 %  O2 Device HFNC  O2 Flow Rate (L/min) 20 L/min  FiO2 (%) 40 %  Assess: MEWS Score  MEWS Temp 0  MEWS Systolic 1  MEWS Pulse 1  MEWS RR 3  MEWS LOC 0  MEWS Score 5  MEWS Score Color Red  Assess: if the MEWS score is Yellow or Red  Were vital signs taken at a resting state? Yes  Focused Assessment No change from prior assessment  Early Detection of Sepsis Score *See Row Information* Low  MEWS guidelines implemented *See Row Information* No, previously red, continue vital signs every 4 hours  Document  Patient Outcome Not stable and remains on department  Progress note created (see row info) Yes

## 2021-02-22 NOTE — Progress Notes (Signed)
   02/21/21 2359  Assess: MEWS Score  BP (!) 86/66  O2 Device HFNC (heated)  O2 Flow Rate (L/min) 25 L/min  Assess: MEWS Score  MEWS Temp 0  MEWS Systolic 1  MEWS Pulse 1  MEWS RR 3  MEWS LOC 0  MEWS Score 5  MEWS Score Color Red  Assess: if the MEWS score is Yellow or Red  Were vital signs taken at a resting state? Yes  Focused Assessment No change from prior assessment  Early Detection of Sepsis Score *See Row Information* Low  MEWS guidelines implemented *See Row Information* No, previously red, continue vital signs every 4 hours  Document  Patient Outcome Not stable and remains on department  Progress note created (see row info) Yes

## 2021-02-22 NOTE — Progress Notes (Addendum)
      [address]       [place] [address]             [phone]      6 Days Post-Op Procedure(s) (LRB): VIDEO BRONCHOSCOPY WITH FLUORO (Left) BRONCHIAL BIOPSIES BRONCHIAL BRUSHINGS BRONCHIAL WASHINGS Subjective: No complaints, visibly short of breath  Objective: Vital signs in last 24 hours: Temp:  [98.4 F (36.9 C)] 98.4 F (36.9 C) (03/12 2100) Pulse Rate:  [99-110] 102 (03/13 0319) Cardiac Rhythm: Sinus tachycardia (03/12 2100) Resp:  [32-47] 32 (03/13 0319) BP: (74-110)/(59-74) 110/74 (03/13 0436) SpO2:  [90 %-94 %] 91 % (03/13 0814) FiO2 (%):  [40 %-50 %] 40 % (03/13 0436) Weight:  [45.8 kg] 45.8 kg (03/13 0002)     Intake/Output from previous day: 03/12 0701 - 03/13 0700 In: 980 [P.O.:980] Out: 515 [Urine:375; Chest Tube:140] Intake/Output this shift: No intake/output data recorded.  General appearance: alert, cooperative and no distress Heart: regular rate and rhythm, S1, S2 normal, no murmur, click, rub or gallop Lungs: tachypnea, clear to ausculatation  Abdomen: soft, non-tender; bowel sounds normal; no masses,  no organomegaly Extremities: extremities normal, atraumatic, no cyanosis or edema Wound: chest tube in good position taped to side and not twisted  Lab Results: Recent Labs    02/20/21 0232 02/22/21 0521  WBC 7.2 11.4*  HGB 10.5* 12.2  HCT 32.2* 36.0  PLT 393 433*   BMET:  Recent Labs    02/21/21 0416 02/22/21 0521  NA 127* 127*  K 3.9 4.4  CL 91* 93*  CO2 27 24  GLUCOSE 276* 359*  BUN 15 27*  CREATININE 0.53 0.58  CALCIUM 8.5* 8.5*    PT/INR: No results for input(s): LABPROT, INR in the last 72 hours. ABG No results found for: PHART, HCO3, TCO2, ACIDBASEDEF, O2SAT CBG (last 3)  Recent Labs    02/21/21 1546 02/21/21 2058 02/22/21 0604  GLUCAP 363* 407* 334*    Assessment/Plan: S/P Procedure(s) (LRB): VIDEO BRONCHOSCOPY WITH FLUORO (Left) BRONCHIAL BIOPSIES BRONCHIAL BRUSHINGS BRONCHIAL  WASHINGS  Cavitary TB of the left lung, large air leak, pneumo on CXR yesterday. Not thought to be a surgical candidate. Her chest tube is in good position today taped to her chest wall. Possible transfer to [facility] per Dr. Cliffton Asters.    LOS: 10 days    Sharlene Dory 02/22/2021   Chart reviewed, patient examined, agree with above. 5 chamber air leak persists. Will repeat CXR in am.

## 2021-02-22 NOTE — Plan of Care (Signed)

## 2021-02-22 NOTE — Progress Notes (Addendum)
PROGRESS NOTE    [name] [name]  EXB:[id number] DOB: 18-Feb-1956 DOA: 02/12/2021 PCP: Truett Perna, MD    Brief Narrative:  [name] [name] was admitted to the hospitalwith aworking diagnosis of left tension spontaneous pneumothorax, left upper lobe cavitation, community acquired pneumonia/ TB(present on admission). Acute hypoxemic respiratory failure.  65 year old female past medical history for type 2 diabetes mellitus, recent vertebral fracture, tobacco abuse, dyslipidemia and hypothyroidism. As an outpatient she was diagnosed with pneumothorax. In the ED she was found to have a 90% left tension hydropneumothorax with severe compression atelectasis of the lung medially and marked mediastinal shift. She had a left chest tube placed with good toleration. Her blood pressure was 146/81, heart rate 94, respirate 23, oxygen saturation 98%. She had increased work of breathing, left-sided chest tube in place with decreased breath sounds bilaterally. Heart S1-S2, present rhythmic, soft abdomen, no lower extremity edema.  Sodium 133, potassium 4.3, chloride 97, bicarb 22, glucose 243, BUN 8, creatinine 0.50, white count 9.3, hemoglobin 12.4, hematocrit 38.8, platelets 578. SARS COVID-19 negative.  Chest radiograph with attention left-sided pneumothorax with mediastinal deviation to the right. EKG 118 bpm, normal axis, normal intervals, sinus rhythm, no ST segment T wave changes, positive LVH per EKG criteria.  Follow-up CT chest showed cavitary lesion. She was placed on broad-spectrum antibiotic therapy. Underwent bronchoscopy showing significant amount of purulent secretions on the left side.  She is on respiratory isolation, possible TB.  Not able to clamp chest tube due to recurrent pneumothorax.   [name] [name] with worsening oxygen requirements, placed on heated high flow nasal cannula 30 L/min.  Slow improvement in oxygen requirements, transitioned to 11L per HFNC.  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  [name] [name] with hypotension, and worsening hypoxemia, back on heated high flow nasal cannula. Started on TB antibiotic regimen.   Persistent pneumothorax, not able to clamp chest tube, now with persistent air leak.  She has a high respiratory rate, rapid shallow breathing, very weak and deconditioned.    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 Left tension spontaneous hydro-pneumothorax/ NEW pulmonary tuberculosis, large cavitary lesion left upper lobe, complicated with superinfection with bacterial pneumonia. Severe sepsis (end-organ failure hypoxemia), present on admission.  Acute hypoxemic respiratory failure.   [name] [name] continue to be very weak and deconditioned, her respiratory rate has been elevated, blood pressure systolic from 70 to 100 mmHg.  Her chest tube is connected to suction 20 cm H20 and continue to have air leak.    Continue respiratory support with heated high flow nasal cannula that has improved her work of breathing, now on 20 L/min and Fi02 40% On bronchodilators and airway clearing techniques.   Continue with antibiotic therapy with: Ethambutol, isoniazid, rifampin, and pyrazinamide   Augmentin for superimposed bacterial infection.   B6 supplementation with pyridoxine   Upgrade to progressive care. Avoid mechanical ventilation, in the setting of pneumothorax.  Follow up chest film in am.  Follow on echocardiogram, keep MAP 60 to 65 mmHg.   2. 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 Hypo-osmolar hyponatremia, not available urine  electrolytes  Na continue to be  low, corrected for hyperglycemia is 133 this am. Continue salt tablets for now and check electrolytes in am. [name] [name] now in agreement to take insulin for glucose control.   3. T2DM uncontrolled with hyperglycemia/ dyslpidemia. [name] [name] has been declining insulin therapy, she was concerned about being on insulin for life.  After explaining her that this is not the case, and the intention is to have better glucose control in the setting of infection she has agreed to take insulin.   Add basal insulin with glargine 10 units and continue insulin sliding scale for glucose cover and monitoring.   Continue withatorvastatin.   4. HypothyroidContinue with levothyroxine   5. Moderate calorie protein malnutrition.Continuenutritional supplements. Encourage for better po intake.   6. Thoracic vertebral compression fracture.On PRNmorphine and tramadol. Added alprazolam for anxiety.    [name] [name] continue to be at high risk for worsening respiratory failure   Status is: Inpatient  Remains inpatient appropriate because:IV treatments appropriate due to intensity of illness or inability to take PO   Dispo: The [name] [name] is from: Home              Anticipated d/c is to: SNF              [name] [name] currently is not medically stable to d/c.   Difficult to place [name] [name] No    DVT prophylaxis: Enoxaparin   Code Status:   full  Family Communication:  I spoke with [name] [name]'s husband at the bedside, we talked in detail about [name] [name]'s condition, plan of care and prognosis and all questions were addressed.      Nutrition Status: Nutrition Problem: Moderate Malnutrition Etiology: chronic illness (DM) Signs/Symptoms: energy intake < or equal to 75% for > or equal to 1 month,mild fat depletion,moderate fat depletion,mild muscle depletion,moderate muscle depletion Interventions: MVI,Liberalize Diet     Skin Documentation:     Consultants:   ID  Pulmonary    Procedures:   Bronchoscopy   Antimicrobials:    Augmentin   Isoniazid  Rifampin  Ethambutol    Pyrazinamide    Subjective: [name] [name] continue to have dyspnea, very weak and deconditioned, no chest pain, no nausea or vomiting.   Objective: Vitals:   02/22/21 0002 02/22/21 0319 02/22/21 0436 02/22/21 0814  BP:   110/74   Pulse:  (!) 102    Resp:  (!) 32    Temp:      TempSrc:      SpO2:  94% 90% 91%  Weight: 45.8 kg     Height:        Intake/Output Summary (Last 24 hours) at 02/22/2021 0917 Last data filed at 02/22/2021 0450 Gross per 24 hour  Intake 800 ml  Output 515 ml  Net 285 ml   Filed Weights   02/20/21 0534 02/21/21 0515 02/22/21 0002  Weight: 45.6 kg 45.7 kg 45.8 kg    Examination:   General: Not in pain, deconditioned and ill looking appearing positive dyspnea at rest.  Neurology: Awake and alert, non focal  E ENT: positive pallor, no icterus, oral mucosa moist/ positive use of accessory muscles.  Cardiovascular: No JVD. S1-S2 present, rhythmic, no gallops, rubs, or murmurs. No lower extremity edema. Pulmonary: positive breath sounds bilaterally, decreased air movement, no wheezing, but positive bilateral diffuse rhonchi and rales. Gastrointestinal. Abdomen soft and non tender Skin. No rashes Musculoskeletal: no joint deformities Chest tube in place left hemithorax.     Data Reviewed: I have personally reviewed following labs and imaging studies  CBC: Recent  Labs  Lab 02/17/21 0432 02/18/21 0353 02/19/21 0211 02/20/21 0232 02/22/21 0521  WBC 10.7* 15.7* 9.5 7.2 11.4*  NEUTROABS  --   --  8.3* 6.0 9.4*  HGB 11.3* 11.9* 10.5* 10.5* 12.2  HCT 35.3* 35.5* 32.3* 32.2* 36.0  MCV 81.1 79.6* 80.0 79.9* 77.8*  PLT 457* 452* 394 393 433*   Basic Metabolic Panel: Recent Labs  Lab 02/18/21 0353 02/19/21 0211 02/20/21 0232 02/21/21 0416 02/22/21 0521  NA 128* 128* 127* 127* 127*  K 4.3 3.6 3.8 3.9 4.4  CL 90* 91* 91* 91* 93*  CO2 26 30  29 27 24   GLUCOSE 278* 132* 149* 276* 359*  BUN 7* 8 9 15  27*  CREATININE 0.43* 0.40* 0.39* 0.53 0.58  CALCIUM 8.5* 7.9* 8.1* 8.5* 8.5*   GFR: Estimated Creatinine Clearance: 52.7 mL/min (by C-G formula based on SCr of 0.58 mg/dL). Liver Function Tests: No results for input(s): AST, ALT, ALKPHOS, BILITOT, PROT, ALBUMIN in the last 168 hours. No results for input(s): LIPASE, AMYLASE in the last 168 hours. No results for input(s): AMMONIA in the last 168 hours. Coagulation Profile: No results for input(s): INR, PROTIME in the last 168 hours. Cardiac Enzymes: No results for input(s): CKTOTAL, CKMB, CKMBINDEX, TROPONINI in the last 168 hours. BNP (last 3 results) No results for input(s): PROBNP in the last 8760 hours. HbA1C: No results for input(s): HGBA1C in the last 72 hours. CBG: Recent Labs  Lab 02/21/21 0523 02/21/21 1214 02/21/21 1546 02/21/21 2058 02/22/21 0604  GLUCAP 261* 316* 363* 407* 334*   Lipid Profile: No results for input(s): CHOL, HDL, LDLCALC, TRIG, CHOLHDL, LDLDIRECT in the last 72 hours. Thyroid Function Tests: No results for input(s): TSH, T4TOTAL, FREET4, T3FREE, THYROIDAB in the last 72 hours. Anemia Panel: No results for input(s): VITAMINB12, FOLATE, FERRITIN, TIBC, IRON, RETICCTPCT in the last 72 hours.    Radiology Studies: I have reviewed all of the imaging during this hospital visit personally     Scheduled Meds: . amoxicillin-clavulanate  1 tablet Oral Q12H  . atorvastatin  10 mg Oral QPM  . calcitonin (salmon)  1 spray Alternating Nares Daily  . cholecalciferol  1,000 Units Oral Daily  . ethambutol  800 mg Oral Daily  . insulin aspart  0-5 Units Subcutaneous QHS  . insulin aspart  0-9 Units Subcutaneous TID WC  . insulin glargine  10 Units Subcutaneous Daily  . Ipratropium-Albuterol  1 puff Inhalation TID  . isoniazid  300 mg Oral Daily  . levothyroxine  50 mcg Oral QAC breakfast  . lidocaine  1 patch Transdermal Q24H  . pyrazinamide   1,000 mg Oral Daily  . vitamin B-6  50 mg Oral Daily  . rifampin  600 mg Oral Daily  . sodium chloride  1 g Oral TID WC   Continuous Infusions:   LOS: 10 days        Tahesha Skeet 04/23/21, MD

## 2021-02-22 NOTE — Plan of Care (Signed)
  Problem: Elimination: Goal: Will not experience complications related to urinary retention Outcome: Completed/Met

## 2021-02-23 ENCOUNTER — Inpatient Hospital Stay (HOSPITAL_COMMUNITY): Payer: BLUE CROSS/BLUE SHIELD

## 2021-02-23 DIAGNOSIS — R0600 Dyspnea, unspecified: Secondary | ICD-10-CM

## 2021-02-23 DIAGNOSIS — S22080D Wedge compression fracture of T11-T12 vertebra, subsequent encounter for fracture with routine healing: Secondary | ICD-10-CM | POA: Diagnosis not present

## 2021-02-23 DIAGNOSIS — J188 Other pneumonia, unspecified organism: Secondary | ICD-10-CM | POA: Diagnosis not present

## 2021-02-23 DIAGNOSIS — J939 Pneumothorax, unspecified: Secondary | ICD-10-CM | POA: Diagnosis not present

## 2021-02-23 DIAGNOSIS — A150 Tuberculosis of lung: Secondary | ICD-10-CM | POA: Diagnosis not present

## 2021-02-23 DIAGNOSIS — I340 Nonrheumatic mitral (valve) insufficiency: Secondary | ICD-10-CM

## 2021-02-23 DIAGNOSIS — J984 Other disorders of lung: Secondary | ICD-10-CM | POA: Diagnosis not present

## 2021-02-23 LAB — BASIC METABOLIC PANEL
Anion gap: 7 (ref 5–15)
BUN: 24 mg/dL — ABNORMAL HIGH (ref 8–23)
CO2: 29 mmol/L (ref 22–32)
Calcium: 8.7 mg/dL — ABNORMAL LOW (ref 8.9–10.3)
Chloride: 96 mmol/L — ABNORMAL LOW (ref 98–111)
Creatinine, Ser: 0.45 mg/dL (ref 0.44–1.00)
GFR, Estimated: >60 mL/min (ref >60)
Glucose, Bld: 166 mg/dL — ABNORMAL HIGH (ref 70–99)
Potassium: 4.4 mmol/L (ref 3.5–5.1)
Sodium: 132 mmol/L — ABNORMAL LOW (ref 135–145)

## 2021-02-23 LAB — GLUCOSE, CAPILLARY
Glucose-Capillary: 150 mg/dL — ABNORMAL HIGH (ref 70–99)
Glucose-Capillary: 133 mg/dL — ABNORMAL HIGH (ref 70–99)
Glucose-Capillary: 139 mg/dL — ABNORMAL HIGH (ref 70–99)
Glucose-Capillary: 115 mg/dL — ABNORMAL HIGH (ref 70–99)

## 2021-02-23 LAB — CBC WITH DIFFERENTIAL/PLATELET
Abs Immature Granulocytes: 0.24 10*3/uL — ABNORMAL HIGH (ref 0.00–0.07)
Basophils Absolute: 0 10*3/uL (ref 0.0–0.1)
Basophils Relative: 0 %
Eosinophils Absolute: 0 10*3/uL (ref 0.0–0.5)
Eosinophils Relative: 0 %
HCT: 34.7 % — ABNORMAL LOW (ref 36.0–46.0)
Hemoglobin: 11.6 g/dL — ABNORMAL LOW (ref 12.0–15.0)
Immature Granulocytes: 2 %
Lymphocytes Relative: 5 %
Lymphs Abs: 0.8 10*3/uL (ref 0.7–4.0)
MCH: 26.2 pg (ref 26.0–34.0)
MCHC: 33.4 g/dL (ref 30.0–36.0)
MCV: 78.5 fL — ABNORMAL LOW (ref 80.0–100.0)
Monocytes Absolute: 0.9 10*3/uL (ref 0.1–1.0)
Monocytes Relative: 6 %
Neutro Abs: 14.1 10*3/uL — ABNORMAL HIGH (ref 1.7–7.7)
Neutrophils Relative %: 87 %
Platelets: 276 10*3/uL (ref 150–400)
RBC: 4.42 MIL/uL (ref 3.87–5.11)
RDW: 14 % (ref 11.5–15.5)
WBC: 16.1 10*3/uL — ABNORMAL HIGH (ref 4.0–10.5)
nRBC: 0 % (ref 0.0–0.2)

## 2021-02-23 LAB — ECHOCARDIOGRAM COMPLETE
Calc EF: 33.9 %
Height: 66 in
S' Lateral: 3.3 cm
Single Plane A2C EF: 31 %
Single Plane A4C EF: 37.7 %
Weight: 1604.95 oz

## 2021-02-23 LAB — CYTOLOGY - NON PAP

## 2021-02-23 MED ORDER — IPRATROPIUM-ALBUTEROL 20-100 MCG/ACT IN AERS
1.0000 | INHALATION_SPRAY | Freq: Four times a day (QID) | RESPIRATORY_TRACT | Status: DC
  Administered 2021-02-23 – 2021-02-24 (×3): 1 via RESPIRATORY_TRACT
  Filled 2021-02-23: qty 4

## 2021-02-23 NOTE — Progress Notes (Signed)
NAMENecole [name], MRN:  [id number], DOB:  [birth year], LOS: 32 ADMISSION DATE:  02/12/2021, CONSULTATION DATE:  02/13/2021 REFERRING MD:  Curly Shores MD, CHIEF COMPLAINT: Abnormal CT scan, rule out TB  Brief History:   65 year old [employer] with diabetes, vertebral fracture, tobacco use.  Presenting with large left hydropneumothorax s/p chest tube placement in the ED. CT shows airspace consolidation, cavitary lesions left greater than right.  She is febrile today prompting placement in airborne isolation due to concern for TB and PCCM consultation for bronchoscopy.  Complains of chronic cough, occasional sputum production.  Past Medical History:   Diabetes, hyperlipidemia, thyroid disease  Social history notable for 15-pack-year smoker.  Quit in 2020.  She denies exposure to TB, no being homeless or incarcerated. She is an immigrant from [place] over 20 years ago but still travels back every year.  Used to work as a Corporate treasurer.  Denies any significant occupational or home exposures  She has about 15 kg weight loss over 3 months which she attributes to her recent fall and vertebral compression fracture, no loss of appetite.  Denies hemoptysis.  Significant Hospital Events:   3/3-admit with pneumothorax, chest tube placement on the left  Consults:   Cardiothoracic surgery PCCM  ID 3/11  Procedures:  3/3 left chest tube (TCTS managing) 3/6 Bronch   Significant Diagnostic Tests:   CTA 02/12/2021-bilateral airspace disease with dense consolidation, cavitary lesions of the left, dense airspace consolidation of the lower lobe, multiple small pulmonary nodules.  Micro Data:  3/3 SARS/ flu >> neg 3/4 AFB expectorated >> neg 3/4 AFB >> positive >> pending sensitivity and sputum cx  3/4 UCx >> insignificant growth 3/4 sputum cx >> GPCs Gram variable rod, moderate normal resp flora  3/4 BCx >> neg 3/7 AFB BAL >> neg 3/7 BAL >> ngtd >> 3/7 fungal BAL >>  3/8 MTB RIF  >>  Antimicrobials:  3/8 augmentin >> 3/11 RIPE - ethambutol, INH, pyrazinamide, rifampin  Interim History / Subjective:   Persistent Air leak  Decreased oxygen requirement, now on 2-4LNC   Got up out of bed today, walked to chair   Objective   Blood pressure (!) 131/91, pulse 100, temperature (!) 97.5 F (36.4 C), temperature source Oral, resp. rate (!) 26, height _0  (1.676 m), weight 45.5 kg, SpO2 96 %.    FiO2 (%):  [40 %] 40 %   Intake/Output Summary (Last 24 hours) at 02/23/2021 1736 Last data filed at 02/23/2021 2585 Gross per 24 hour  Intake 120 ml  Output 500 ml  Net -380 ml   Filed Weights   02/21/21 0515 02/22/21 0002 02/23/21 0500  Weight: 45.7 kg 45.8 kg 45.5 kg    Examination:  General:  Thin, chronically ill appearing middle aged F who appears older than stated age  33: NCAT pink mm. Anicteric sclera.  Neuro: AAO following commands. CV: tachycardic, reg rhythm  PULM:  Symmetrical chest expansion. L Lung with loud sucking/gurgle sound. Increased RR, shallow respirations, no accessory use on [place]. L chest tube on 20 suction, with + air leak  GI: soft thin ndnt  Extremities: Symmetrically decreased muscle mass BUE BLE.  Skin: Pale, c/d/w no   Resolved Hospital Problem list     Assessment & Plan:   Hypoxia, improving Pt now on 2-4L [place] from prior HFNC  Left cavitary lung lesion-- TB with superimposed bacterial PNA Bronchoscopy performed 3/6 Cryptococcal ag is negative.  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  -failed clamp trial 3/8 -continue to -20 suction, continues to have large air leak     PCCM will sign off Please re-engage if the  patient's clinical status changes or if we can be of further assistance   Best practice (evaluated daily)  Per primary  Labs   CBC: Recent Labs  Lab 02/18/21 0353 02/19/21 0211 02/20/21 0232 02/22/21 0521 02/23/21 0352  WBC 15.7* 9.5 7.2 11.4* 16.1*  NEUTROABS  --  8.3* 6.0 9.4* 14.1*  HGB 11.9* 10.5* 10.5* 12.2 11.6*  HCT 35.5* 32.3* 32.2* 36.0 34.7*  MCV 79.6* 80.0 79.9* 77.8* 78.5*  PLT 452* 394 393 433* 998    Basic Metabolic Panel: Recent Labs  Lab 02/19/21 0211 02/20/21 0232 02/21/21 0416 02/22/21 0521 02/23/21 0352  NA 128* 127* 127* 127* 132*  K 3.6 3.8 3.9 4.4 4.4  CL 91* 91* 91* 93* 96*  CO2 _0 GLUCOSE 132* 149* 276* 359* 166*  BUN _1 27* 24*  CREATININE 0.40* 0.39* 0.53 0.58 0.45  CALCIUM 7.9* 8.1* 8.5* 8.5* 8.7*   GFR: Estimated Creatinine Clearance: 52.4 mL/min (by C-G formula based on SCr of 0.45 mg/dL). Recent Labs  Lab 02/19/21 0211 02/20/21 0232 02/22/21 0521 02/23/21 0352  WBC 9.5 7.2 11.4* 16.1*    Liver Function Tests: No results for input(s): AST, ALT, ALKPHOS, BILITOT, PROT, ALBUMIN in the last 168 hours. No results for input(s): LIPASE, AMYLASE in the last 168 hours. No results for input(s): AMMONIA in the last 168 hours.  ABG No results found for: PHART, PCO2ART, PO2ART, HCO3, TCO2, ACIDBASEDEF, O2SAT   Coagulation Profile: No results for input(s): INR, PROTIME in the last 168 hours.  Cardiac Enzymes: No results for input(s): CKTOTAL, CKMB, CKMBINDEX, TROPONINI in the last 168 hours.  HbA1C: Hgb A1c MFr Bld  Date/Time Value Ref Range Status  02/13/2021 03:57 AM 10.8 (H) 4.8 - 5.6 % Final    Comment:    (NOTE) Pre diabetes:          5.7%-6.4%  Diabetes:              >6.4%  Glycemic control for   <7.0% adults with diabetes   03/07/2020 11:03 AM 13.7 (H) 4.6 - 6.5 % Final    Comment:    Glycemic Control Guidelines for People with Diabetes:Non Diabetic:  <6%Goal of Therapy: <7%Additional Action  Suggested:  >8%     CBG: Recent Labs  Lab 02/22/21 1637 02/22/21 2128 02/23/21 0547 02/23/21 1135 02/23/21 1653  GLUCAP 155* 118* 150* 133* 139*    Signature:   Eliseo Gum MSN, AGACNP-BC [place] [phone] If no answer, [phone] 02/23/2021, 5:36 PM

## 2021-02-23 NOTE — Progress Notes (Signed)
Physical Therapy Treatment Patient Details Name: [name] [name] MRN: [id number] 02/23/2021  Jacinto Halim., PT Acute Rehabilitation Services [phone]  (pager) [phone]  (office) DOB: [birth year] Today's Date: 02/23/2021    History of Present Illness 65 y.o. female with admitted from PCP office on 3/3 with shortness of breath, CXR showing pneumothorax, s/p chest tube placement on 3/3. Workup for TB, vs atypical infection, vs malignancy, plan for bronchoscopy on 3/7. Pt with recent fall, compression fracture of T12 (unsure if from fall or not). PMH significant for type 2 diabetes, recent vertebral fracture, tobacco use, hyperlipidemia, hypothyroidism.    PT Comments    Pt is making progress despite the limitations imposed by the confinement of the room and pt's mildly self-limiting behaviors.  Emphasis on OOB activity, including progression of gait around the room, negotiation of and balance in a confined space.    Follow Up Recommendations  Home health PT;Other (comment) (vs no follow up if progresses well.)     Equipment Recommendations  Rolling walker with 5" wheels    Recommendations for Other Services       Precautions / Restrictions Precautions Precautions: Fall Precaution Comments: Chest tube to wall suction, can be to water seal for mobility. On airborne precautions. Heated high flow    Mobility  Bed Mobility Overal bed mobility: Needs Assistance Bed Mobility: Supine to Sit;Sit to Supine     Supine to sit: Min assist Sit to supine: Min guard   General bed mobility comments: pt continues to reach out for hand to grab onto..  No assist needed or asked for to get back into bed.    Transfers Overall transfer level: Needs assistance Equipment used: Rolling walker (2 wheeled) Transfers: Sit to/from Stand Sit to Stand: Min assist         General transfer comment: stability assist  Ambulation/Gait Ambulation/Gait assistance: Min guard;Min assist Gait Distance  (Feet): 35 Feet (or more feet in the confined space of her room) Assistive device: Rolling walker (2 wheeled) Gait Pattern/deviations: Step-through pattern;Decreased stride length Gait velocity: decr Gait velocity interpretation: <1.8 ft/sec, indicate of risk for recurrent falls General Gait Details: limited space to maneuver the RW or deal with the lines/tubes, but pt able to assist RW mobility in the confined space.  Needed min guard in general, but needed min for 1 deviation due to limitations of the lines.   Stairs             Wheelchair Mobility    Modified Rankin (Stroke Patients Only)       Balance Overall balance assessment: Needs assistance Sitting-balance support: No upper extremity supported Sitting balance-Leahy Scale: Fair     Standing balance support: Bilateral upper extremity supported Standing balance-Leahy Scale: Poor Standing balance comment: reliant on external assist or AD                            Cognition Arousal/Alertness: Awake/alert Behavior During Therapy: Flat affect Overall Cognitive Status:  (NT formally, followed simple direction well.)                                        Exercises      General Comments General comments (skin integrity, edema, etc.): SpO2 in the low 90's on 2L [place], HR in the upper 80's.      Pertinent Vitals/Pain Pain Assessment: Faces Faces Pain Scale:  Hurts a little bit Pain Location: general  with moving to EOB Pain Descriptors / Indicators: Grimacing Pain Intervention(s): Monitored during session    Home Living                      Prior Function            PT Goals (current goals can now be found in the care plan section) Acute Rehab PT Goals Patient Stated Goal: not stated PT Goal Formulation: With patient Time For Goal Achievement: 02/27/21 Potential to Achieve Goals: Good Progress towards PT goals: Progressing toward goals    Frequency    Min  3X/week      PT Plan Current plan remains appropriate    Co-evaluation              AM-PAC PT "6 Clicks" Mobility   Outcome Measure  Help needed turning from your back to your side while in a flat bed without using bedrails?: A Little Help needed moving from lying on your back to sitting on the side of a flat bed without using bedrails?: A Little Help needed moving to and from a bed to a chair (including a wheelchair)?: A Little Help needed standing up from a chair using your arms (e.g., wheelchair or bedside chair)?: A Little Help needed to walk in hospital room?: A Little Help needed climbing 3-5 steps with a railing? : A Little 6 Click Score: 18    End of Session Equipment Utilized During Treatment: Oxygen Activity Tolerance: Patient tolerated treatment well;Patient limited by fatigue Patient left: in bed;with call bell/phone within reach;with bed alarm set Nurse Communication: Mobility status PT Visit Diagnosis: Other abnormalities of gait and mobility (R26.89);Difficulty in walking, not elsewhere classified (R26.2);Pain Pain - part of body:  (chest tube site)     Time: 3785-8850 PT Time Calculation (min) (ACUTE ONLY): 13 min  Charges:  $Gait Training: 8-22 mins                     02/23/2021  Jacinto Halim., PT Acute Rehabilitation Services [phone]  (pager) [phone]  (office)   Eliseo Gum Gaylan Fauver 02/23/2021, 4:02 PM

## 2021-02-23 NOTE — Progress Notes (Signed)
  Echocardiogram 2D Echocardiogram has been performed.  Augustine Radar 02/23/2021, 11:35 AM

## 2021-02-23 NOTE — Progress Notes (Signed)
      [address]       [place], [address]             [phone]       7 Days Post-Op Procedure(s) (LRB): VIDEO BRONCHOSCOPY WITH FLUORO (Left) BRONCHIAL BIOPSIES BRONCHIAL BRUSHINGS BRONCHIAL WASHINGS  Subjective: Resting in bed. Denies any pain this morning and said her breathing is about the same.  On 25L (FiO2 0.4) high-flow O2, sats in low 90's.  Objective: Vital signs in last 24 hours: Temp:  [94 F (34.4 C)-97.9 F (36.6 C)] 97.6 F (36.4 C) (03/14 0500) Pulse Rate:  [88-115] 100 (03/14 0600) Cardiac Rhythm: Normal sinus rhythm (03/14 0700) Resp:  [27-54] 45 (03/14 0600) BP: (92-117)/(62-92) 114/74 (03/14 0600) SpO2:  [90 %-94 %] 92 % (03/14 0600) FiO2 (%):  [40 %] 40 % (03/14 0152) Weight:  [45.5 kg] 45.5 kg (03/14 0500)      Intake/Output from previous day: 03/13 0701 - 03/14 0700 In: 960 [P.O.:960] Out: 1000 [Urine:700; Chest Tube:300]   Physical Exam: General:  Awake and alert, appears to be breathing comfortably at rest.  Cardiovascular: HR 110-130 Pulmonary: Coarse breath sounds, and diminished on left, unchanged.  CXR this AM showing a larger left PTX, has a large air leak.  Abdomen: Soft, non tender Extremities: Mild bilateral lower extremity edema. Chest Tube: to suction, has a large, fairly continuous air leak. The chest tube and connections are secure.    Lab Results: CBC: Recent Labs    02/22/21 0521 02/23/21 0352  WBC 11.4* 16.1*  HGB 12.2 11.6*  HCT 36.0 34.7*  PLT 433* 276   BMET:  Recent Labs    02/22/21 0521 02/23/21 0352  NA 127* 132*  K 4.4 4.4  CL 93* 96*  CO2 24 29  GLUCOSE 359* 166*  BUN 27* 24*  CREATININE 0.58 0.45  CALCIUM 8.5* 8.7*    CLINICAL DATA:  Pneumothorax.  Chest tube in place  EXAM: PORTABLE CHEST 1 VIEW  COMPARISON:  February 21, 2021.  FINDINGS: Pigtail catheter remains on the left with pneumothorax seen laterally, medially, inferiorly, mildly larger than on  previous study. No tension component evident. There is consolidation throughout the left mid and lower lung regions. Apparent cavitary lesions stable in the left upper lobe. Reticulonodular opacities throughout the right lung are stable. Heart size is within normal limits. No appreciable adenopathy. No bone lesions.  IMPRESSION: Pneumothorax on the left is slightly larger with pigtail catheter again noted on the left. Consolidation throughout left mid and lower lungs as with cavitary lesion left upper lobe. Reticulonodular opacities remain on the right. No consolidation on the right. Stable cardiac silhouette.   Electronically Signed   By: Bretta Bang III M.D.   On: 02/23/2021 07:53   Assessment/Plan:   -65yo former smoker with 15kg Wt loss over the past 3 months admitted with a left tension PTX that has resolved with insertion of a left pigtail pleural tube by the ED physician. AFB smear from expectorated sputm now positive.  Pt has TB with superimposed pneumonia per ID. Multidrug therapy initiated.   -Recurrent PTX with a larger air leak than we saw last week. The chest tube dressing was removed the tubing and insertion site inspected. New sterile dressing applied.  The pigtail catheter is intact and functioning but may need to replace with a larger-bore tube.   Leary Roca, PA-C 02/23/2021,8:16 AM [phone]

## 2021-02-23 NOTE — Progress Notes (Signed)
Upon entering room, the patient has taken off all of her monitors and oxygen. She let me spot check her O2 level and it was 84%. She will not let me put the monitor back on her and she will not put her oxygen back on. I told her that she could die and she does not seem to care. I have notified Dr. Ella Jubilee.

## 2021-02-23 NOTE — Progress Notes (Signed)
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  AFB sputum culture smear positive, cultures pending from 02/13/21.  MTB NAAT pending.  Quantiferon indeterminate.  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   . Continue RIPE therapy and vitamin B6 (started 02/20/21) . Monitor LFT's . Maintain airborne isolation . Follow up results AFB cultures and MTB/RIF NAAT . Continue Augmentin for now for possible superimposed bacterial pneumonia.  Tentatively planning for 4 week course (3/8-03/17/21). . Will need patient to be referred to [facility] for post-discharge DOT tx of TB. Hadassah Pais is dhs tb nurse POC (phone [phone])     SUBJECTIVE:   24 hour events:  No acute events noted overnight Afebrile T-max 97.8 Remains on high flow nasal cannula Discussed with CT surgery  Patient voices no new complaints this morning.  Reports her breathing is stable and about the same.  No fevers, chills, nausea, vomiting.  No further questions.   OBJECTIVE:   Blood pressure 126/72, pulse (!) 105, temperature 97.8 F (36.6 C), temperature source Oral, resp. rate 18, height 5\' 6"  (1.676 m), weight 45.5 kg, SpO2 90 %. Body mass index is 16.19 kg/m.  Physical Exam Constitutional:      Comments:  Thin, frail appearing woman lying in bed  HENT:     Head: Normocephalic and atraumatic.  Cardiovascular:     Rate and Rhythm: Tachycardia present.  Pulmonary:     Effort: Pulmonary effort is normal. No respiratory distress.     Breath sounds: Rhonchi present.     Comments: Left-sided chest tube in place Neurological:     General: No focal deficit present.     Mental Status: She is oriented to person, place, and time.  Psychiatric:        Mood and Affect: Mood normal.        Behavior: Behavior normal.       Lab Results: Lab Results  Component Value Date   WBC 16.1 (H) 02/23/2021   HGB 11.6 (L) 02/23/2021   HCT 34.7 (L) 02/23/2021   MCV 78.5 (L) 02/23/2021   PLT 276 02/23/2021    Lab Results  Component Value Date   NA 132 (L) 02/23/2021   K 4.4 02/23/2021   CO2 29 02/23/2021   GLUCOSE 166 (H) 02/23/2021   BUN 24 (H) 02/23/2021   CREATININE 0.45 02/23/2021   CALCIUM 8.7 (L) 02/23/2021   GFRNONAA >60 02/23/2021   GFRAA 114 01/19/2008    Lab Results  Component Value Date   ALT 16 01/22/2021   AST 22 01/22/2021   ALKPHOS 95 01/22/2021   BILITOT 1.1 01/22/2021    No results found for: CRP  No results found for: ESRSEDRATE   I have reviewed the micro and lab results in Epic.  Imaging: DG Chest  Port 1 View  Result Date: 02/23/2021 CLINICAL DATA:  Pneumothorax.  Chest tube in place EXAM: PORTABLE CHEST 1 VIEW COMPARISON:  February 21, 2021. FINDINGS: Pigtail catheter remains on the left with pneumothorax seen laterally, medially, inferiorly, mildly larger than on previous study. No tension component evident. There is consolidation throughout the left mid and lower lung regions. Apparent cavitary lesions stable in the left upper lobe. Reticulonodular opacities throughout the right lung are stable. Heart size is within normal limits. No appreciable adenopathy. No bone lesions. IMPRESSION: Pneumothorax on the left is slightly larger with pigtail catheter again noted on the  left. Consolidation throughout left mid and lower lungs as with cavitary lesion left upper lobe. Reticulonodular opacities remain on the right. No consolidation on the right. Stable cardiac silhouette. Electronically Signed   By: Bretta Bang III M.D.   On: 02/23/2021 07:53     Imaging  independently reviewed in Epic.    Vedia Coffer for Infectious Disease [facility] [phone] pager 02/23/2021, 12:35 PM  I spent greater than 35 minutes with the patient including greater than 50% of time in face to face counsel of the patient and in coordination of their care.

## 2021-02-23 NOTE — Progress Notes (Signed)
PROGRESS NOTE    [name] [name]  XNA:[id number] DOB: Apr 19, 1956 DOA: 02/12/2021 PCP: Truett Perna, MD    Brief Narrative:  [name] [name] was admitted to the hospitalwith aworking diagnosis of left tension spontaneous pneumothorax, left upper lobe cavitation, community acquired pneumonia/ TB(present on admission). Acute hypoxemic respiratory failure.  65 year old female past medical history for type 2 diabetes mellitus, recent vertebral fracture, tobacco abuse, dyslipidemia and hypothyroidism. As an outpatient she was diagnosed with pneumothorax. In the ED she was found to have a 90% left tension hydropneumothorax with severe compression atelectasis of the lung medially and marked mediastinal shift. She had a left chest tube placed with good toleration. Her blood pressure was 146/81, heart rate 94, respirate 23, oxygen saturation 98%. She had increased work of breathing, left-sided chest tube in place with decreased breath sounds bilaterally. Heart S1-S2, present rhythmic, soft abdomen, no lower extremity edema.  Sodium 133, potassium 4.3, chloride 97, bicarb 22, glucose 243, BUN 8, creatinine 0.50, white count 9.3, hemoglobin 12.4, hematocrit 38.8, platelets 578. SARS COVID-19 negative.  Chest radiograph with attention left-sided pneumothorax with mediastinal deviation to the right. EKG 118 bpm, normal axis, normal intervals, sinus rhythm, no ST segment T wave changes, positive LVH per EKG criteria.  Follow-up CT chest showed cavitary lesion. She was placed on broad-spectrum antibiotic therapy. Underwent bronchoscopy showing significant amount of purulent secretions on the left side.  She is on respiratory isolation, possible TB.  Not able to clamp chest tube due to recurrent pneumothorax.   Patient with worsening oxygen requirements, placed on heated high flow nasal cannula 30 L/min.  Slow improvement in oxygen requirements, transitioned to 11L per HFNC.  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  Patient with hypotension, and worsening hypoxemia, back on heated high flow nasal cannula. Started on TB antibiotic regimen.   Persistent pneumothorax, not able to clamp chest tube, now with persistent air leak.  She has a high respiratory rate, rapid shallow breathing, very weak and deconditioned.     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 Left tension spontaneous hydro-pneumothorax/ NEW pulmonary tuberculosis, large cavitary lesion left upper lobe, complicated with superinfection with bacterial pneumonia. Severe sepsis (end-organ failure hypoxemia), present on admission.  Acute hypoxemic respiratory failure.   Very weak and deconditioned, oxymetry is 92 to 93% on heated high flow nasal cannula at 25 L/min and 40% Fi02. Follow up chest film for today (personally reviewed), continue to have left lower lobe peripheral pneumothorax, large cavitary lesion left upper lobe.  Interstitial infiltrates on the right side.  Chest tube continue to have air leak, and is connected to suction 20 cm H20.  Wbc at 16 from 11.   Plan to continue with heated high flow nasal cannula to keep oxygenation 92% or greater and to improve with her work of breathing. Not to wean for now. Continue with bronchodilators (increase dose of combivent to qid and continue with prn albuterol) and airway clearing techniques with flutter valve and incentive spirometer.   Antibiotic therapy with: 1. Ethambutol, isoniazid, rifampin, and pyrazinamide  2. Augmentin for superimposed bacterial infection.   B6 supplementation  with pyridoxine  Patient very deconditioned, keep MAP 60 to 65 mmHg. Pending echocardiogram.   Follow recommendations from CT surgery for persistent pneumothorax, patient seems to be very weak and deconditioned for surgical intervention at this point.   2. 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 Hypo-osmolar hyponatremia, not available urine electrolytes  Serum Na has improved to 132, renal function continue to be stable with serum cr at 0,45, and bicarbonate at 29, Patient now taking insulin for hyperglycemia. Tolerating po well, but poor oral intake.  Continue to hold on IV fluids for now.   3. T2DMuncontrolled with hyperglycemia/ dyslpidemia.Now patient is taking insulin with improvement in her serum glucose.  Fasting glucose is 166 this am, capillary 118 and 150.  Continue with basal insulin 10 units and insulin sliding scale for glucose cover and monitoring.   Onatorvastatin.   4. HypothyroidOnlevothyroxine   5. Moderate calorie protein malnutrition.Nutritional supplements.  Encourage for better po intake.   6. Thoracic vertebral compression fracture.Continue with as needed morphine and tramadol. Tolerating well alprazolam for anxiety.     Patient continue to be at high risk for worsening respiratory failure.   Status is: Inpatient  Remains inpatient appropriate because:IV treatments appropriate due to intensity of illness or inability to take PO   Dispo: The patient is from: Home              Anticipated d/c is to: Home              Patient currently is not medically stable to d/c.   Difficult to place patient No   DVT prophylaxis: Enoxaparin   Code Status:   full  Family Communication:  No family at the bedside      Nutrition Status: Nutrition Problem: Moderate Malnutrition Etiology: chronic illness (DM) Signs/Symptoms: energy intake < or equal to 75% for > or equal to 1 month,mild fat depletion,moderate  fat depletion,mild muscle depletion,moderate muscle depletion Interventions: MVI,Liberalize Diet      Consultants:   ID  Pulmonary   CT surgery   Procedures:   Chest tube on the left  Bronchoscopy   Antimicrobials:   Rifampin  Isoniazid  Ethambutol  Augmentin     Subjective: Patient continue to be very weak and deconditioned, positive dyspnea worse with movement, positive cough and congestion, poor oral intake, no chest pain.   Objective: Vitals:   02/23/21 0152 02/23/21 0400 02/23/21 0500 02/23/21 0600  BP:  111/77 114/82 114/74  Pulse: (!) 104 (!) 108 96 100  Resp: (!) 49 (!) 43 (!) 48 (!) 45  Temp:   97.6 F (36.4 C)   TempSrc:   Oral   SpO2: 94% 92% 93% 92%  Weight:   45.5 kg   Height:        Intake/Output Summary (Last 24 hours) at 02/23/2021 0821 Last data filed at 02/23/2021 0600 Gross per 24 hour  Intake 960 ml  Output 1000 ml  Net -40 ml   Filed Weights   02/21/21 0515 02/22/21 0002 02/23/21 0500  Weight: 45.7 kg 45.8 kg 45.5 kg    Examination:   General: deconditioned and ill looking appearing, dyspnea at rest,  Neurology: Awake and alert, non focal  E ENT: positive pallor, no icterus, oral mucosa moist Cardiovascular: No JVD. S1-S2 present, rhythmic, no gallops, rubs, or murmurs. No lower extremity edema. Pulmonary: positive breath sounds bilaterally, decreased ventilation on the left, no wheezing, but diffuse rhonchi bilaterally with rales. Gastrointestinal. Abdomen soft and non tender Skin.  No rashes Musculoskeletal: no joint deformities     Data Reviewed: I have personally reviewed following labs and imaging studies  CBC: Recent Labs  Lab 02/18/21 0353 02/19/21 0211 02/20/21 0232 02/22/21 0521 02/23/21 0352  WBC 15.7* 9.5 7.2 11.4* 16.1*  NEUTROABS  --  8.3* 6.0 9.4* 14.1*  HGB 11.9* 10.5* 10.5* 12.2 11.6*  HCT 35.5* 32.3* 32.2* 36.0 34.7*  MCV 79.6* 80.0 79.9* 77.8* 78.5*  PLT 452* 394 393 433* 276   Basic  Metabolic Panel: Recent Labs  Lab 02/19/21 0211 02/20/21 0232 02/21/21 0416 02/22/21 0521 02/23/21 0352  NA 128* 127* 127* 127* 132*  K 3.6 3.8 3.9 4.4 4.4  CL 91* 91* 91* 93* 96*  CO2 30 29 27 24 29   GLUCOSE 132* 149* 276* 359* 166*  BUN 8 9 15  27* 24*  CREATININE 0.40* 0.39* 0.53 0.58 0.45  CALCIUM 7.9* 8.1* 8.5* 8.5* 8.7*   GFR: Estimated Creatinine Clearance: 52.4 mL/min (by C-G formula based on SCr of 0.45 mg/dL). Liver Function Tests: No results for input(s): AST, ALT, ALKPHOS, BILITOT, PROT, ALBUMIN in the last 168 hours. No results for input(s): LIPASE, AMYLASE in the last 168 hours. No results for input(s): AMMONIA in the last 168 hours. Coagulation Profile: No results for input(s): INR, PROTIME in the last 168 hours. Cardiac Enzymes: No results for input(s): CKTOTAL, CKMB, CKMBINDEX, TROPONINI in the last 168 hours. BNP (last 3 results) No results for input(s): PROBNP in the last 8760 hours. HbA1C: No results for input(s): HGBA1C in the last 72 hours. CBG: Recent Labs  Lab 02/22/21 0604 02/22/21 1229 02/22/21 1637 02/22/21 2128 02/23/21 0547  GLUCAP 334* 199* 155* 118* 150*   Lipid Profile: No results for input(s): CHOL, HDL, LDLCALC, TRIG, CHOLHDL, LDLDIRECT in the last 72 hours. Thyroid Function Tests: No results for input(s): TSH, T4TOTAL, FREET4, T3FREE, THYROIDAB in the last 72 hours. Anemia Panel: No results for input(s): VITAMINB12, FOLATE, FERRITIN, TIBC, IRON, RETICCTPCT in the last 72 hours.    Radiology Studies: I have reviewed all of the imaging during this hospital visit personally     Scheduled Meds: . amoxicillin-clavulanate  1 tablet Oral Q12H  . atorvastatin  10 mg Oral QPM  . calcitonin (salmon)  1 spray Alternating Nares Daily  . cholecalciferol  1,000 Units Oral Daily  . ethambutol  800 mg Oral Daily  . insulin aspart  0-5 Units Subcutaneous QHS  . insulin aspart  0-9 Units Subcutaneous TID WC  . insulin glargine  10 Units  Subcutaneous Daily  . Ipratropium-Albuterol  1 puff Inhalation BID  . isoniazid  300 mg Oral Daily  . levothyroxine  50 mcg Oral QAC breakfast  . lidocaine  1 patch Transdermal Q24H  . pyrazinamide  1,000 mg Oral Daily  . vitamin B-6  50 mg Oral Daily  . rifampin  600 mg Oral Daily  . sodium chloride  1 g Oral TID WC   Continuous Infusions:   LOS: 11 days        Kairah Leoni 2129, MD  '

## 2021-02-24 ENCOUNTER — Inpatient Hospital Stay (HOSPITAL_COMMUNITY): Payer: BLUE CROSS/BLUE SHIELD

## 2021-02-24 DIAGNOSIS — J939 Pneumothorax, unspecified: Secondary | ICD-10-CM | POA: Diagnosis not present

## 2021-02-24 DIAGNOSIS — A150 Tuberculosis of lung: Secondary | ICD-10-CM | POA: Diagnosis not present

## 2021-02-24 DIAGNOSIS — S22080D Wedge compression fracture of T11-T12 vertebra, subsequent encounter for fracture with routine healing: Secondary | ICD-10-CM | POA: Diagnosis not present

## 2021-02-24 DIAGNOSIS — J9601 Acute respiratory failure with hypoxia: Secondary | ICD-10-CM | POA: Diagnosis not present

## 2021-02-24 DIAGNOSIS — J984 Other disorders of lung: Secondary | ICD-10-CM | POA: Diagnosis not present

## 2021-02-24 LAB — COMPREHENSIVE METABOLIC PANEL WITH GFR
ALT: 22 U/L (ref 0–44)
AST: 36 U/L (ref 15–41)
Albumin: 1.5 g/dL — ABNORMAL LOW (ref 3.5–5.0)
Alkaline Phosphatase: 157 U/L — ABNORMAL HIGH (ref 38–126)
Anion gap: 4 — ABNORMAL LOW (ref 5–15)
BUN: 18 mg/dL (ref 8–23)
CO2: 31 mmol/L (ref 22–32)
Calcium: 8.5 mg/dL — ABNORMAL LOW (ref 8.9–10.3)
Chloride: 97 mmol/L — ABNORMAL LOW (ref 98–111)
Creatinine, Ser: 0.34 mg/dL — ABNORMAL LOW (ref 0.44–1.00)
GFR, Estimated: >60 mL/min
Glucose, Bld: 108 mg/dL — ABNORMAL HIGH (ref 70–99)
Potassium: 4 mmol/L (ref 3.5–5.1)
Sodium: 132 mmol/L — ABNORMAL LOW (ref 135–145)
Total Bilirubin: 0.8 mg/dL (ref 0.3–1.2)
Total Protein: 5.1 g/dL — ABNORMAL LOW (ref 6.5–8.1)

## 2021-02-24 LAB — GLUCOSE, CAPILLARY
Glucose-Capillary: 82 mg/dL (ref 70–99)
Glucose-Capillary: 129 mg/dL — ABNORMAL HIGH (ref 70–99)
Glucose-Capillary: 319 mg/dL — ABNORMAL HIGH (ref 70–99)
Glucose-Capillary: 88 mg/dL (ref 70–99)
Glucose-Capillary: 108 mg/dL — ABNORMAL HIGH (ref 70–99)

## 2021-02-24 LAB — CBC WITH DIFFERENTIAL/PLATELET
Abs Immature Granulocytes: 0.11 K/uL — ABNORMAL HIGH (ref 0.00–0.07)
Basophils Absolute: 0 K/uL (ref 0.0–0.1)
Basophils Relative: 0 %
Eosinophils Absolute: 0 K/uL (ref 0.0–0.5)
Eosinophils Relative: 0 %
HCT: 34.9 % — ABNORMAL LOW (ref 36.0–46.0)
Hemoglobin: 11.5 g/dL — ABNORMAL LOW (ref 12.0–15.0)
Immature Granulocytes: 1 %
Lymphocytes Relative: 8 %
Lymphs Abs: 0.9 K/uL (ref 0.7–4.0)
MCH: 26.1 pg (ref 26.0–34.0)
MCHC: 33 g/dL (ref 30.0–36.0)
MCV: 79.1 fL — ABNORMAL LOW (ref 80.0–100.0)
Monocytes Absolute: 0.8 K/uL (ref 0.1–1.0)
Monocytes Relative: 7 %
Neutro Abs: 9.9 K/uL — ABNORMAL HIGH (ref 1.7–7.7)
Neutrophils Relative %: 84 %
Platelets: 271 K/uL (ref 150–400)
RBC: 4.41 MIL/uL (ref 3.87–5.11)
RDW: 14.1 % (ref 11.5–15.5)
WBC: 11.7 K/uL — ABNORMAL HIGH (ref 4.0–10.5)
nRBC: 0 % (ref 0.0–0.2)

## 2021-02-24 MED ORDER — FUROSEMIDE 10 MG/ML IJ SOLN
20.0000 mg | Freq: Once | INTRAMUSCULAR | Status: AC
  Administered 2021-02-24: 20 mg via INTRAVENOUS
  Filled 2021-02-24: qty 2

## 2021-02-24 MED ORDER — IPRATROPIUM-ALBUTEROL 20-100 MCG/ACT IN AERS: 1.0000 | INHALATION_SPRAY | Freq: Four times a day (QID) | RESPIRATORY_TRACT | Status: DC | PRN

## 2021-02-24 NOTE — Progress Notes (Addendum)
[address]       [place] [address]             [phone]       8 Days Post-Op Procedure(s) (LRB): VIDEO BRONCHOSCOPY WITH FLUORO (Left) BRONCHIAL BIOPSIES BRONCHIAL BRUSHINGS BRONCHIAL WASHINGS  Subjective: Resting in bed. Denies any pain this morning and said her breathing is "better". On 4L  O2, sats in mid 90's.  Objective: Vital signs in last 24 hours: Temp:  [97.4 F (36.3 C)-97.9 F (36.6 C)] 97.8 F (36.6 C) (03/15 0336) Pulse Rate:  [94-111] 94 (03/15 0336) Cardiac Rhythm: Normal sinus rhythm (03/15 0700) Resp:  [18-35] 30 (03/15 0336) BP: (91-131)/(65-91) 107/75 (03/15 0336) SpO2:  [83 %-98 %] 98 % (03/15 0748) Weight:  [44.1 kg] 44.1 kg (03/15 0336)      Intake/Output from previous day: 03/14 0701 - 03/15 0700 In: 600 [P.O.:600] Out: 460 [Urine:400; Chest Tube:60]   Physical Exam: General:  Awake and alert, appears to be breathing comfortably at rest.  Cardiovascular: HR 90-110 Pulmonary: Coarse breath sounds, and diminished on left, unchanged.  CXR this AM showing a stable left PTX, has a large air leak that is unchanged. The pigtail catheter is near the periphery of the the left pleural space and is unchanged.  Abdomen: Soft, non tender Extremities: Mild bilateral lower extremity edema. Chest Tube: to suction, has a large, continuous air leak. The chest tube and connections are secure.    Lab Results: CBC: Recent Labs    02/23/21 0352 02/24/21 0327  WBC 16.1* 11.7*  HGB 11.6* 11.5*  HCT 34.7* 34.9*  PLT 276 271   BMET:  Recent Labs    02/23/21 0352 02/24/21 0327  NA 132* 132*  K 4.4 4.0  CL 96* 97*  CO2 29 31  GLUCOSE 166* 108*  BUN 24* 18  CREATININE 0.45 0.34*  CALCIUM 8.7* 8.5*    EXAM: PORTABLE CHEST 1 VIEW  COMPARISON: February 23, 2021  FINDINGS: Chest tube on the left again noted with stable pneumothorax on the left. Consolidation throughout the left mid and lower lung zones remains without  apparent change. Cavitary lesion left upper lobe unchanged. Diffuse reticulonodular stage ule prominence throughout the right lung is stable. Subtle ill-defined opacity right base may represent developing pneumonia in this area.  Heart size within normal limits. Pulmonary vascularity unchanged. No bone lesions.  IMPRESSION: Question developing infiltrate right base with subtle airspace opacity in this area, new from 1 day prior. Diffuse reticulonodular opacities throughout the right lung remains.  Stable pneumothorax on the left with chest tube in place. Consolidation left mid and lower lung regions as well as apparent cavitary lesion left upper lobe stable.  Stable cardiac silhouette.   Electronically Signed By: Bretta Bang III M.D. On: 02/24/2021 08:07   Assessment/Plan:   -64yo former smoker with 15kg Wt loss over the past 3 months admitted with a left tension PTX that initially resolved with insertion of a left pigtail pleural tube by the ED physician. AFB smear from expectorated sputm now positive.  Pt has TB with superimposed pneumonia per ID. Multidrug therapy initiated.  She is weak and significantly malnourished and unfortunately is a poor candidate for a surgical drainage procedure.    -Recurrent PTX with a larger air leak than we saw last week but this is stable since yesterday. The chest tube is functioning adequately.   Leary Roca, PA-C 02/24/2021,9:51 AM [phone]  Agree with above. Given the  size of her cavitary lesion, air leak likely will not resolve without complex resection and interpositional muscle flap.  In her current clinical and nutritional status, she is at very high risk for failure, along with a high inpatient mortality risk.  Given the complex level of care that she will require, I recommend that she be transferred to a tertiary center for further management.  Continue chest tube management for now  Evangelynn Lochridge [employer]

## 2021-02-24 NOTE — Plan of Care (Signed)

## 2021-02-24 NOTE — Progress Notes (Addendum)
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 Acute hypoxemic respiratory failure. New systolic heart failure.   65 year old female past medical history for type 2 diabetes mellitus, recent vertebral fracture, tobacco abuse, dyslipidemia and hypothyroidism. As an outpatient she was diagnosed with pneumothorax and referred to the hospital. In the ED she was found to have a 90% left tension hydropneumothorax with severe compression atelectasis of the lung medially and marked mediastinal shift. She had a left chest tube placed with good toleration. Her blood pressure was 146/81, heart rate 94, respiratory rate 23, oxygen saturation 98%. She had increased work of breathing, left-sided chest tube in place with decreased breath sounds bilaterally. Heart S1-S2, present rhythmic, soft abdomen, no lower extremity edema.  Sodium 133, potassium 4.3, chloride 97, bicarb 22, glucose 243, BUN 8, creatinine 0.50, white count 9.3, hemoglobin 12.4, hematocrit 38.8, platelets 578. SARS COVID-19 negative.  Chest radiograph with tension left-sided pneumothorax with mediastinal deviation to the right. EKG 118 bpm, normal axis, normal intervals, sinus rhythm, no ST segment T wave changes, positive LVH per EKG criteria.  Follow-up CT chest showed cavitary lesion. She was placed on broad-spectrum antibiotic therapy. Underwent bronchoscopy showing significant amount of purulent secretions on the left side.  She was placed on respiratory isolation, possible TB.  Not able to clamp chest tube due to recurrent pneumothorax.   Patient had worsening oxygen requirements, placed on heated high flow nasal cannula up to 30  L/min.  She continue to be very weak and deconditioned, microbiology confirmed pulmonary TB (2+AFB), and specific antibiotic was started.   Patient with hypotension, and worsening hypoxemia, back on heated high flow nasal cannula. Started on TB antibiotic regimen 02/20/21  Persistent pneumothorax, with positive air leak, not able to clamp chest tube.  Patient may need decortication per CT surgery, but her physical functional status is very low due to severe deconditioning.   She has a high respiratory rate, rapid shallow breathing, due to asvance deconditioning.   Improved oxygen requirements down to 4 L/min per [place].   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 Left tension spontaneous hydro-pneumothorax/NEW pulmonary tuberculosis, large cavitary lesion left upper lobe, complicated with superinfection with bacterial pneumonia. Severe sepsis (end-organ failure hypoxemia), present on admission. Acute hypoxemic respiratory failure.  Yesterday patient was transitioned to 4 L/min per [place] with good toleration. This am her oxygenation is 98% on 4 L/min per [place]. Continue to have increase respiratory rate but looks more comfortable.  Wbc is down to 11,7.  On bronchodilators combivent to qid and prn albuterol. Continue with airway clearing techniques with flutter valve and incentive spirometer.   Tolerating well antibiotic therapy with: 1. Ethambutol, isoniazid,rifampin, and pyrazinamide  2. Augmentin for superimposed bacterial infection, plan to stop after full 4 weeks, on 03/17/21.   Continue with B6 supplementation with pyridoxine  Patient with persistent air leak per  chest tube, chest film from today personally reviewed noted persistent pneumothorax  at the left lower lobe peripheral zone. Right hemithorax with interstitial infiltrates.   Follow with CT surgery recommendations, may need a larger tube.   2. 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 Hypo-osmolar hyponatremia, not available urine electrolytes  Stable renal function with serum cr at 0.34, K is 4,0 and bicarbonate st 31. Patient is tolerating po well, but poor appetite.  Continue to hold on IV fluids and follow electrolytes in am.   3. New acute systolic heart failure. EF 20 to 25%, severe decreased LV systolic function, LV with severe akinesis of the left ventricule, mid apical anteroseptal wall and inferior wall.   Will add furosemide 20 mg Iv daily for now, to improve volume status.  Hold on b blockade or RAAS inhibition for now due to risk of hypotension.   May need further work up once infection more stable.   3. T2DMuncontrolled with hyperglycemia/ dyslpidemia. Fasting glucose is 108 this am, capillary 115, 82, 129.  Tolerating well basal insulin 10 units, plus insulin sliding scale for glucose cover and monitoring.   Continue withatorvastatin.   4. HypothyroidContinuelevothyroxine   5. Moderate calorie protein malnutrition.Continue with nutritional supplements. Patient will need home health services at home at discharge.   6. Thoracic vertebral compression fracture.Pain control with as needed morphine and tramadol. On PRN alprazolam for anxiety.    Patient continue to be at high risk for worsening respiratory failure   Status is: Inpatient  Remains inpatient appropriate because:Inpatient level of care appropriate due to severity of illness   Dispo: The patient is from: Home              Anticipated d/c is to: Home              Patient currently is not medically stable to d/c.   Difficult to place patient No    DVT  prophylaxis: Enoxaparin   Code Status:   full  Family Communication:  No family at the bedside, I spoke with her husband yesterday at the bedside and all questions were addressed.      Nutrition Status: Nutrition Problem: Moderate Malnutrition Etiology: chronic illness (DM) Signs/Symptoms: energy intake < or equal to 75% for > or equal to 1 month,mild fat depletion,moderate fat depletion,mild muscle depletion,moderate muscle depletion Interventions: MVI,Liberalize Diet     Consultants:   ID  Pulmonary   CT surgery   Procedures:   Bronchoscopy   Left chest tube   Antimicrobials:   Augmentin  Isoniazid  Ethambutol  Rifampin  Pyrazinamide     Subjective: Patient is feeling better, continue to have dyspnea that is worse with movement, no chest pain.   Objective: Vitals:   02/23/21 1948 02/23/21 2336 02/24/21 0336 02/24/21 0748  BP: 107/76 91/65 107/75   Pulse: 100 95 94   Resp: (!) 35 (!) 25 (!) 30   Temp: (!) 97.4 F (36.3 C) 97.9 F (36.6 C) 97.8 F (36.6 C)   TempSrc: Oral Oral Oral   SpO2: 94% 96% 94% 98%  Weight:   44.1 kg   Height:        Intake/Output Summary (Last 24 hours) at 02/24/2021 0836 Last data filed at 02/24/2021 0351 Gross per 24 hour  Intake 480 ml  Output 460 ml  Net 20 ml   Filed Weights   02/22/21 0002 02/23/21 0500 02/24/21 0336  Weight: 45.8 kg 45.5 kg 44.1 kg    Examination:   General: dyspnea at rest, not in pain, deconditioned and ill  looking appearing  Neurology: Awake and alert, non focal  E ENT: positive pallor, no icterus, oral mucosa moist Cardiovascular: No JVD. S1-S2 present, rhythmic, no gallops, rubs, or murmurs. Trace lower extremity edema. Pulmonary: positive breath sounds bilaterally,decreased ventilation on the left. Positive rhonchi and rales bilaterally Gastrointestinal. Abdomen soft and non tender Skin. No rashes Musculoskeletal: no joint deformities     Data Reviewed: I have personally reviewed  following labs and imaging studies  CBC: Recent Labs  Lab 02/19/21 0211 02/20/21 0232 02/22/21 0521 02/23/21 0352 02/24/21 0327  WBC 9.5 7.2 11.4* 16.1* 11.7*  NEUTROABS 8.3* 6.0 9.4* 14.1* 9.9*  HGB 10.5* 10.5* 12.2 11.6* 11.5*  HCT 32.3* 32.2* 36.0 34.7* 34.9*  MCV 80.0 79.9* 77.8* 78.5* 79.1*  PLT 394 393 433* 276 271   Basic Metabolic Panel: Recent Labs  Lab 02/20/21 0232 02/21/21 0416 02/22/21 0521 02/23/21 0352 02/24/21 0327  NA 127* 127* 127* 132* 132*  K 3.8 3.9 4.4 4.4 4.0  CL 91* 91* 93* 96* 97*  CO2 29 27 24 29 31   GLUCOSE 149* 276* 359* 166* 108*  BUN 9 15 27* 24* 18  CREATININE 0.39* 0.53 0.58 0.45 0.34*  CALCIUM 8.1* 8.5* 8.5* 8.7* 8.5*   GFR: Estimated Creatinine Clearance: 50.8 mL/min (A) (by C-G formula based on SCr of 0.34 mg/dL (L)). Liver Function Tests: Recent Labs  Lab 02/24/21 0327  AST 36  ALT 22  ALKPHOS 157*  BILITOT 0.8  PROT 5.1*  ALBUMIN 1.5*   No results for input(s): LIPASE, AMYLASE in the last 168 hours. No results for input(s): AMMONIA in the last 168 hours. Coagulation Profile: No results for input(s): INR, PROTIME in the last 168 hours. Cardiac Enzymes: No results for input(s): CKTOTAL, CKMB, CKMBINDEX, TROPONINI in the last 168 hours. BNP (last 3 results) No results for input(s): PROBNP in the last 8760 hours. HbA1C: No results for input(s): HGBA1C in the last 72 hours. CBG: Recent Labs  Lab 02/23/21 1135 02/23/21 1653 02/23/21 2118 02/24/21 0609 02/24/21 0808  GLUCAP 133* 139* 115* 82 129*   Lipid Profile: No results for input(s): CHOL, HDL, LDLCALC, TRIG, CHOLHDL, LDLDIRECT in the last 72 hours. Thyroid Function Tests: No results for input(s): TSH, T4TOTAL, FREET4, T3FREE, THYROIDAB in the last 72 hours. Anemia Panel: No results for input(s): VITAMINB12, FOLATE, FERRITIN, TIBC, IRON, RETICCTPCT in the last 72 hours.    Radiology Studies: I have reviewed all of the imaging during this hospital visit  personally     Scheduled Meds: . amoxicillin-clavulanate  1 tablet Oral Q12H  . atorvastatin  10 mg Oral QPM  . calcitonin (sal[place])  1 spray Alternating Nares Daily  . cholecalciferol  1,000 Units Oral Daily  . ethambutol  800 mg Oral Daily  . insulin aspart  0-5 Units Subcutaneous QHS  . insulin aspart  0-9 Units Subcutaneous TID WC  . insulin glargine  10 Units Subcutaneous Daily  . isoniazid  300 mg Oral Daily  . levothyroxine  50 mcg Oral QAC breakfast  . lidocaine  1 patch Transdermal Q24H  . pyrazinamide  1,000 mg Oral Daily  . vitamin B-6  50 mg Oral Daily  . rifampin  600 mg Oral Daily   Continuous Infusions:   LOS: 12 days        Ousman Dise 02/26/21, MD

## 2021-02-24 NOTE — Progress Notes (Signed)
Nutrition Follow-up  DOCUMENTATION CODES:   Non-severe (moderate) malnutrition in context of chronic illness,Underweight  INTERVENTION:   -Continue MVI with minerals  -Continue Greek yogurt with meals   NUTRITION DIAGNOSIS:   Moderate Malnutrition related to chronic illness (DM) as evidenced by energy intake < or equal to 75% for > or equal to 1 month,mild fat depletion,moderate fat depletion,mild muscle depletion,moderate muscle depletion.  Ongoing.  GOAL:   Patient will meet greater than or equal to 90% of their needs  Progressing.  MONITOR:   PO intake,Supplement acceptance,Labs,Weight trends,Skin,I & O's  REASON FOR ASSESSMENT:   Consult Assessment of nutrition requirement/status  ASSESSMENT:   [name] [name] is a 65 y.o. female with a PMH significant for type 2 diabetes, recent vertebral fracture, tobacco use, hyperlipidemia, hypothyroidism.  3/7 - video bronchoscopy w/ fluoro (left)  3/9 - rapid response, respiratory distress, tachycardia   Per MD note, pt has lost 15 kg over the past 3 months which pt attributed to her recent fall and fracture not due to loss of appetite. Pt currently has TB with superimposed pne per ID, multidrug therapy initiated. Pt continues to be at risk for worsening resp failure, but improved oxygen req. Recurrent PTX with large air leak, chest tube functioning adequately. New acute systolic heart failure.   Per chart meal documentation pt has been consuming 25-100% of meals, with most meal completions over 50%. Spoke with RN who mentioned pt has been eating well and also mentioned that pt's husband brings her in food. Pt reports that she's had a good appetite and felt she had been eating more than 50% of her trays. Pt reports that she is liking the [place] yogurts she has been receiving with meals and has been doing her best to eat them.   Pt's weights reviewed and have shown a concerning weight loss since admit. However, unsure of accuracy  of admit weight.  Admit weight: 134.92#  Current weight: 97.2#  Meds reviewed: Vitamin D3 (daily), Vitamin B6 (daily) Labs reviewed: Sodium (132, low), Corrected Calcium (10.5), CBG (82-319)  I&O's reviewed: -4,392.7 since admit  UOP reviewed: 400 mL x 24 hrs Chest tube reviewed: 60 mL x 24 hrs  Diet Order:   Diet Order            Diet regular Room service appropriate? Yes; Fluid consistency: Thin  Diet effective now                 EDUCATION NEEDS:   Education needs have been addressed  Skin:  Skin Assessment: Reviewed RN Assessment  Last BM:  02/23/21 (type 2)  Height:   Ht Readings from Last 1 Encounters:  02/12/21 5\' 6"  (1.676 m)    Weight:   Wt Readings from Last 1 Encounters:  02/24/21 44.1 kg    Ideal Body Weight:  59.1 kg  BMI:  Body mass index is 15.69 kg/m.  Estimated Nutritional Needs:   Kcal:  1800-2000  Protein:  100-115 grams  Fluid:  > 1.8 L    02/26/21, Dietetic Intern 02/24/2021 4:17 PM

## 2021-02-25 ENCOUNTER — Inpatient Hospital Stay (HOSPITAL_COMMUNITY): Payer: BLUE CROSS/BLUE SHIELD

## 2021-02-25 DIAGNOSIS — Z794 Long term (current) use of insulin: Secondary | ICD-10-CM

## 2021-02-25 DIAGNOSIS — J984 Other disorders of lung: Secondary | ICD-10-CM | POA: Diagnosis not present

## 2021-02-25 DIAGNOSIS — A150 Tuberculosis of lung: Secondary | ICD-10-CM | POA: Diagnosis not present

## 2021-02-25 DIAGNOSIS — E113599 Type 2 diabetes mellitus with proliferative diabetic retinopathy without macular edema, unspecified eye: Secondary | ICD-10-CM | POA: Diagnosis not present

## 2021-02-25 LAB — CBC WITH DIFFERENTIAL/PLATELET
Abs Immature Granulocytes: 0.13 10*3/uL — ABNORMAL HIGH (ref 0.00–0.07)
Basophils Absolute: 0 10*3/uL (ref 0.0–0.1)
Basophils Relative: 0 %
Eosinophils Absolute: 0 10*3/uL (ref 0.0–0.5)
Eosinophils Relative: 0 %
HCT: 37.2 % (ref 36.0–46.0)
Hemoglobin: 12.3 g/dL (ref 12.0–15.0)
Immature Granulocytes: 1 %
Lymphocytes Relative: 10 %
Lymphs Abs: 1.4 10*3/uL (ref 0.7–4.0)
MCH: 25.8 pg — ABNORMAL LOW (ref 26.0–34.0)
MCHC: 33.1 g/dL (ref 30.0–36.0)
MCV: 78 fL — ABNORMAL LOW (ref 80.0–100.0)
Monocytes Absolute: 0.9 10*3/uL (ref 0.1–1.0)
Monocytes Relative: 7 %
Neutro Abs: 10.7 10*3/uL — ABNORMAL HIGH (ref 1.7–7.7)
Neutrophils Relative %: 82 %
Platelets: 233 10*3/uL (ref 150–400)
RBC: 4.77 MIL/uL (ref 3.87–5.11)
RDW: 14.2 % (ref 11.5–15.5)
WBC: 13.1 10*3/uL — ABNORMAL HIGH (ref 4.0–10.5)
nRBC: 0 % (ref 0.0–0.2)

## 2021-02-25 LAB — BASIC METABOLIC PANEL
Anion gap: 7 (ref 5–15)
BUN: 17 mg/dL (ref 8–23)
CO2: 30 mmol/L (ref 22–32)
Calcium: 8.2 mg/dL — ABNORMAL LOW (ref 8.9–10.3)
Chloride: 97 mmol/L — ABNORMAL LOW (ref 98–111)
Creatinine, Ser: 0.42 mg/dL — ABNORMAL LOW (ref 0.44–1.00)
GFR, Estimated: >60 mL/min (ref >60)
Glucose, Bld: 83 mg/dL (ref 70–99)
Potassium: 3.6 mmol/L (ref 3.5–5.1)
Sodium: 134 mmol/L — ABNORMAL LOW (ref 135–145)

## 2021-02-25 LAB — GLUCOSE, CAPILLARY
Glucose-Capillary: 95 mg/dL (ref 70–99)
Glucose-Capillary: 144 mg/dL — ABNORMAL HIGH (ref 70–99)
Glucose-Capillary: 85 mg/dL (ref 70–99)
Glucose-Capillary: 118 mg/dL — ABNORMAL HIGH (ref 70–99)

## 2021-02-25 NOTE — TOC Progression Note (Signed)
Transition of Care [facility]) - Progression Note    Patient Details  Name: [name] [name] MRN: [id number] Date of Birth: [birth year]  [facility] CM/SW Contact  Leone Haven, RN Phone Number: 02/25/2021, 4:27 PM  Clinical Narrative:     NCM contacted Hadassah Pais is dhs tb nurse POC (phone [phone]) Gave her the information . She will be contacting Wendi the NCM covering if she needs any thing else.         Expected Discharge Plan and Services                                                 Social Determinants of Health (SDOH) Interventions    Readmission Risk Interventions No flowsheet data found.

## 2021-02-25 NOTE — Progress Notes (Signed)
Occupational Therapy Treatment Patient Details Name: [name] [name] MRN: [id number] DOB: 1956-06-29 Today's Date: 02/25/2021    History of present illness 65 y.o. female with admitted from PCP office on 3/3 with shortness of breath, CXR showing pneumothorax, s/p chest tube placement on 3/3. Workup for TB, vs atypical infection, vs malignancy, plan for bronchoscopy on 3/7. Pt with recent fall, compression fracture of T12 (unsure if from fall or not). PMH significant for type 2 diabetes, recent vertebral fracture, tobacco use, hyperlipidemia, hypothyroidism.   OT comments  Pt making gradual progress towards OT goals this session. Limited session as pt reports dizziness once sitting EOB with pt laying self back down ( see vitals below). Pt continues to present with decreased activity tolerance and  generalized deconditioning. Pt requires light MIN A to transition to EOB, and supervision for LB ADLs. Pt able to complete x10 reps with IS( with education provided on correct technique) and x10 reps with flutter valve to facilitate improved cardiopulmonary status. Pt would continue to benefit from skilled occupational therapy while admitted and after d/c to address the below listed limitations in order to improve overall functional mobility and facilitate independence with BADL participation. DC plan remains appropriate, will follow acutely per POC.   pt on 6L [place] with sats WFL during session  supine 85/73 ( 79) EOB 93/71 ( 79)  supine 108/94 ( 100)  Follow Up Recommendations  Supervision/Assistance - 24 hour;Home health OT    Equipment Recommendations  3 in 1 bedside commode    Recommendations for Other Services      Precautions / Restrictions Precautions Precautions: Fall Precaution Comments: Chest tube to wall suction, can be to water seal for mobility. On airborne precautions. Restrictions Weight Bearing Restrictions: No       Mobility Bed Mobility Overal bed mobility: Needs  Assistance Bed Mobility: Supine to Sit;Sit to Supine     Supine to sit: Min assist Sit to supine: Min guard   General bed mobility comments: pt requried MIN A to elevate trunk into sitting as pt noted to reach out to COTA for assist, min guard to return to supine for safety awareness and management of chest tube line    Transfers                 General transfer comment: defer d/t pt reports of dizziness    Balance Overall balance assessment: Needs assistance Sitting-balance support: No upper extremity supported Sitting balance-Leahy Scale: Fair Sitting balance - Comments: able to reach out of BOS to don socks with no LOB                                   ADL either performed or assessed with clinical judgement   ADL Overall ADL's : Needs assistance/impaired             Lower Body Bathing: Supervison/ safety;Sit to/from stand Lower Body Bathing Details (indicate cue type and reason): simulated via LB dressing     Lower Body Dressing: Supervision/safety;Sitting/lateral leans Lower Body Dressing Details (indicate cue type and reason): to don socks EOB   Toilet Transfer Details (indicate cue type and reason): pt declined OOB transfer d/t reports of dizziness, laying self back down         Functional mobility during ADLs: Minimal assistance (bed mobility only) General ADL Comments: pt continues to present with decresed activity tolerance and generalized deconditioning, pt with soft BP this  session reporting dizziness therefore limited session     Vision       Perception     Praxis      Cognition Arousal/Alertness: Awake/alert Behavior During Therapy: Flat affect Overall Cognitive Status: Impaired/Different from baseline Area of Impairment: Safety/judgement;Problem solving                         Safety/Judgement: Decreased awareness of safety;Decreased awareness of deficits   Problem Solving: Decreased initiation General  Comments: pt seems to have impaired awareness into deficits as slightly self limiting despite education on importance of working on cardioplumonary tools ( IS and flutter valve) and increasing overall functional mobility        Exercises Other Exercises Other Exercises: IS from EOB x10 reps ( education provided on proper technique) Other Exercises: flutter valve x10 reps from EOB   Shoulder Instructions       General Comments pt on 6L [place] with sats WFL during session, supine BP 85/73 ( 79), EOB 93/71 ( 79), supine 108/94 ( 100)    Pertinent Vitals/ Pain       Pain Assessment: Faces Faces Pain Scale: Hurts a little bit Pain Location: generalized with mobility Pain Descriptors / Indicators: Grimacing Pain Intervention(s): Monitored during session  Home Living                                          Prior Functioning/Environment              Frequency  Min 2X/week        Progress Toward Goals  OT Goals(current goals can now be found in the care plan section)  Progress towards OT goals: Progressing toward goals  Acute Rehab OT Goals Patient Stated Goal: not stated Time For Goal Achievement: 02/27/21 Potential to Achieve Goals: Fair  Plan Discharge plan remains appropriate;Frequency remains appropriate    Co-evaluation                 AM-PAC OT "6 Clicks" Daily Activity     Outcome Measure   Help from another person eating meals?: None Help from another person taking care of personal grooming?: A Little Help from another person toileting, which includes using toliet, bedpan, or urinal?: A Little Help from another person bathing (including washing, rinsing, drying)?: A Little Help from another person to put on and taking off regular upper body clothing?: None Help from another person to put on and taking off regular lower body clothing?: A Little 6 Click Score: 20    End of Session Equipment Utilized During Treatment: Oxygen;Other  (comment) (6L [place])  OT Visit Diagnosis: Unsteadiness on feet (R26.81)   Activity Tolerance Patient tolerated treatment well   Patient Left in bed;with call bell/phone within reach;with bed alarm set   Nurse Communication Mobility status        Time: 6283-1517 OT Time Calculation (min): 16 min  Charges: OT General Charges $OT Visit: 1 Visit OT Treatments $Therapeutic Activity: 8-22 mins  Lenor Derrick., COTA/L Acute Rehabilitation Services [phone]    Barron Schmid 02/25/2021, 4:18 PM

## 2021-02-25 NOTE — Progress Notes (Signed)
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 Acute hypoxemic respiratory failure. New systolic heart failure.   65 year old female past medical history for type 2 diabetes mellitus, recent vertebral fracture, tobacco abuse, dyslipidemia and hypothyroidism. As an outpatient she was diagnosed with pneumothorax and referred to the hospital. In the ED she was found to have a 90% left tension hydropneumothorax with severe compression atelectasis of the lung medially and marked mediastinal shift. She had a left chest tube placed with good toleration. Her blood pressure was 146/81, heart rate 94, respiratory rate 23, oxygen saturation 98%. She had increased work of breathing, left-sided chest tube in place with decreased breath sounds bilaterally. Heart S1-S2, present rhythmic, soft abdomen, no lower extremity edema.  Sodium 133, potassium 4.3, chloride 97, bicarb 22, glucose 243, BUN 8, creatinine 0.50, white count 9.3, hemoglobin 12.4, hematocrit 38.8, platelets 578. SARS COVID-19 negative.  Chest radiograph with tension left-sided pneumothorax with mediastinal deviation to the right. EKG 118 bpm, normal axis, normal intervals, sinus rhythm, no ST segment T wave changes, positive LVH per EKG criteria.  Follow-up CT chest showed cavitary lesion. She was placed on broad-spectrum antibiotic therapy. Underwent bronchoscopy showing significant amount of purulent secretions on the left side.  She was placed on respiratory isolation, possible TB.  Not able to clamp chest tube due to recurrent pneumothorax.   Patient had worsening oxygen requirements, placed on heated high flow nasal cannula up to 30  L/min.  She continue to be very weak and deconditioned, microbiology confirmed pulmonary TB (2+AFB), and specific antibiotic was started.   Patient with hypotension, and worsening hypoxemia, back on heated high flow nasal cannula. Started on TB antibiotic regimen 02/20/21  Persistent pneumothorax, with positive air leak, not able to clamp chest tube.  Patient may need decortication per CT surgery, but her physical functional status is very low due to severe deconditioning.   She has a high respiratory rate, rapid shallow breathing, due to asvance deconditioning.   Improved oxygen requirements down to 4 L/min per [place].     Consultants:   ID, PCCM, cardiothoracic, nutrition  Procedures:   Antimicrobials:       Subjective: Feels okay.  No complaints.  Objective: Vitals:   02/25/21 0900 02/25/21 1000 02/25/21 1100 02/25/21 1200  BP: (!) 84/66 109/76 (!) 92/56 98/66  Pulse: (!) 48 100 (!) 105 (!) 46  Resp: (!) 43 (!) 29 (!) 45 (!) 26  Temp:    (!) 97.3 F (36.3 C)  TempSrc:    Oral  SpO2: 94% 95% 95% 94%  Weight:      Height:        Intake/Output Summary (Last 24 hours) at 02/25/2021 1518 Last data filed at 02/25/2021 1300 Gross per 24 hour  Intake 240 ml  Output 1920 ml  Net -1680 ml   Filed Weights   02/23/21 0500 02/24/21 0336 02/25/21 0500  Weight: 45.5 kg 44.1 kg 41.4 kg    Examination:  General exam: Appears calm and comfortable  Respiratory system: Decreased breath sounds at bases, chest tube in place Cardiovascular system: S1 & S2 heard, RRR. No JVD, murmurs, rubs, gallops or clicks.  Gastrointestinal system: Abdomen is nondistended, soft and nontender.Normal bowel sounds heard. Central nervous system: Alert and oriented.  Grossly  intact Extremities: No edema Skin: Warm dry Psychiatry:  Mood & affect appropriate.     Data Reviewed: I have personally reviewed following labs and imaging studies  CBC: Recent Labs  Lab 02/20/21 0232  02/22/21 0521 02/23/21 0352 02/24/21 0327 02/25/21 0453  WBC 7.2 11.4* 16.1* 11.7* 13.1*  NEUTROABS 6.0 9.4* 14.1* 9.9* 10.7*  HGB 10.5* 12.2 11.6* 11.5* 12.3  HCT 32.2* 36.0 34.7* 34.9* 37.2  MCV 79.9* 77.8* 78.5* 79.1* 78.0*  PLT 393 433* 276 271 939   Basic Metabolic Panel: Recent Labs  Lab 02/21/21 0416 02/22/21 0521 02/23/21 0352 02/24/21 0327 02/25/21 0453  NA 127* 127* 132* 132* 134*  K 3.9 4.4 4.4 4.0 3.6  CL 91* 93* 96* 97* 97*  CO2 _0 GLUCOSE 276* 359* 166* 108* 83  BUN 15 27* 24* 18 17  CREATININE 0.53 0.58 0.45 0.34* 0.42*  CALCIUM 8.5* 8.5* 8.7* 8.5* 8.2*   GFR: Estimated Creatinine Clearance: 47.7 mL/min (A) (by C-G formula based on SCr of 0.42 mg/dL (L)). Liver Function Tests: Recent Labs  Lab 02/24/21 0327  AST 36  ALT 22  ALKPHOS 157*  BILITOT 0.8  PROT 5.1*  ALBUMIN 1.5*   No results for input(s): LIPASE, AMYLASE in the last 168 hours. No results for input(s): AMMONIA in the last 168 hours. Coagulation Profile: No results for input(s): INR, PROTIME in the last 168 hours. Cardiac Enzymes: No results for input(s): CKTOTAL, CKMB, CKMBINDEX, TROPONINI in the last 168 hours. BNP (last 3 results) No results for input(s): PROBNP in the last 8760 hours. HbA1C: No results for input(s): HGBA1C in the last 72 hours. CBG: Recent Labs  Lab 02/24/21 1129 02/24/21 1634 02/24/21 2029 02/25/21 0636 02/25/21 1201  GLUCAP 319* 88 108* 95 144*   Lipid Profile: No results for input(s): CHOL, HDL, LDLCALC, TRIG, CHOLHDL, LDLDIRECT in the last 72 hours. Thyroid Function Tests: No results for input(s): TSH, T4TOTAL, FREET4, T3FREE, THYROIDAB in the last 72 hours. Anemia Panel: No results for input(s): VITAMINB12, FOLATE, FERRITIN, TIBC, IRON, RETICCTPCT in the last 72 hours. Sepsis Labs: No results for input(s): PROCALCITON, LATICACIDVEN in the last 168 hours.  Recent Results (from the past 240 hour(s))  Culture, respiratory     Status:  None   Collection Time: 02/16/21 12:12 PM   Specimen: Bronchoalveolar Lavage; Respiratory  Result Value Ref Range Status   Specimen Description BRONCHIAL ALVEOLAR LAVAGE  Final   Special Requests NONE  Final   Gram Stain   Final    FEW WBC PRESENT, PREDOMINANTLY PMN NO ORGANISMS SEEN    Culture   Final    NO GROWTH 2 DAYS Performed at [facility], [address], [place], [place] [address]    Report Status 02/19/2021 FINAL  Final  Acid Fast Smear (AFB)     Status: None   Collection Time: 02/16/21 12:12 PM   Specimen: Bronchial Alveolar Lavage  Result Value Ref Range Status   AFB Specimen Processing Concentration  Final   Acid Fast Smear Negative  Final    Comment: (NOTE) Performed At: [facility] [place], [place] [id number] Rush Farmer MD QJ:[id number]    Source (AFB) BRONCHIAL ALVEOLAR LAVAGE  Final    Comment: LUL Performed at [facility], [place] [address], [place], [place] [address]   Fungus Culture With Stain     Status: None (Preliminary result)   Collection Time: 02/16/21 12:12 PM   Specimen: Bronchial Alveolar Lavage  Result Value Ref Range Status   Fungus Stain Final report  Final    Comment: (NOTE) Performed At: [facility] [place], [place] [id number] Rush Farmer MD [id number]    Fungus (Mycology) Culture PENDING  Incomplete   Fungal Source BRONCHIAL ALVEOLAR LAVAGE  Final    Comment: LUL Performed at [facility], [place] [address], [place], [place] [address]   MTB RIF NAA w/o Culture, Sputum     Status: None   Collection Time: 02/16/21 12:12 PM   Specimen: Expectorated Sputum  Result Value Ref Range Status   M Tuberculosis Complex REFERT  Final    Comment: (NOTE) Please refer to the following specimen for additional lab results.      Reference [phone] for [phone] for BAL sourced specimen      Shaune Leeks notified 02/20/2021 Per the College of American Pathologists (CAP)  guidelines, a culture must be performed on all samples regardless of the molecular test result. By ordering this test code, the client has assumed responsibility for the performance of the culture.    Rifampin NOT PERFORMED  Final    Comment: Test not performed  Fungus Culture Result     Status: None   Collection Time: 02/16/21 12:12 PM  Result Value Ref Range Status   Result 1 Comment  Final    Comment: (NOTE) KOH/Calcofluor preparation:  no fungus observed. Performed At: [facility] [place], [place] [id number] Rush Farmer MD [id number]   MTB RIF NAA Non-Sputum, w/o Culture     Status: Abnormal (Preliminary result)   Collection Time: 02/16/21 12:12 PM  Result Value Ref Range Status   Source PENDING  Incomplete   Specimen Source Bronchial  Final   M tuberculosis complex Comment (A) NOT DETECTED Final    Comment: (NOTE) Mycobacterium tuberculosis complex (MTBC) detected. NOTIFIED/FAXED [place] ON [id number].(BL) FAX [phone] This test was developed and its performance characteristics determined by [facility]. It has not been cleared or approved by the Food and Drug Administration. Per the College of American Pathologists (CAP) guidelines, a culture must be performed on all samples regardless of the molecular test result. By ordering this test code, the client has assumed responsibility for the performance of the culture.    Rifampin Comment Susceptible Final    Comment: (NOTE) Likely rifampin susceptible; No rpoB mutation detected. This test was developed and its performance characteristics determined by [facility]. It has not been cleared or approved by the Food and Drug Administration.    AFB Specimen Processing Concentration  Final    Comment: (NOTE) Performed At: [facility] [place], [place] [id number] Rush Farmer MD KN:[id number]   Culture, respiratory     Status: None   Collection Time: 02/16/21 12:48 PM    Specimen: Bronchoalveolar Lavage; Respiratory  Result Value Ref Range Status   Specimen Description BRONCHIAL ALVEOLAR LAVAGE  Final   Special Requests LINGULA  Final   Gram Stain   Final    RARE WBC PRESENT, PREDOMINANTLY PMN NO ORGANISMS SEEN    Culture   Final    NO GROWTH 2 DAYS Performed at [facility], [address], [place], [place] [address]    Report Status 02/19/2021 FINAL  Final  Acid Fast Smear (AFB)     Status: None   Collection Time: 02/16/21 12:48 PM   Specimen: Bronchial Alveolar Lavage  Result Value Ref Range Status   AFB Specimen Processing Concentration  Final  Acid Fast Smear Negative  Final    Comment: (NOTE) Performed At: [facility] [place], [place] [id number] Rush Farmer MD SP:[id number]    Source (AFB) BRONCHIAL ALVEOLAR LAVAGE  Final    Comment: LINGULA Performed at [facility], [place] [address], [place], [place] [address]   Fungus Culture With Stain     Status: None (Preliminary result)   Collection Time: 02/16/21 12:48 PM   Specimen: Bronchial Alveolar Lavage  Result Value Ref Range Status   Fungus Stain Final report  Final    Comment: (NOTE) Performed At: [facility] [place], [place] [id number] Rush Farmer MD TD:[id number]    Fungus (Mycology) Culture PENDING  Incomplete   Fungal Source BRONCHIAL ALVEOLAR LAVAGE  Final    Comment: LINGULA Performed at [facility], [place] [address], [place], [place] [address]   Fungus Culture Result     Status: None   Collection Time: 02/16/21 12:48 PM  Result Value Ref Range Status   Result 1 Comment  Final    Comment: (NOTE) KOH/Calcofluor preparation:  no fungus observed. Performed At: [facility] [place], [place] [id number] Rush Farmer MD GT:[id number]   Blastomyces Antigen     Status: None   Collection Time: 02/17/21 10:29 AM   Specimen: Blood  Result Value Ref Range Status   Blastomyces  Antigen None Detected None Detected ng/mL Final    Comment: (NOTE) Results reported as ng/mL in 0.2 - 14.7 ng/mL range Results above the limit of detection but below 0.2 ng/mL are reported as 'Positive, Below the Limit of Quantification' Results above 14.7 ng/mL are reported as 'Positive, Above the Limit of Quantification'    Specimen Type SERUM  Final    Comment: (NOTE) Performed At: [place], [place] [id number] Bruce Donath MD CW:[id number]   MTB RIF NAA w/o Culture, Sputum     Status: None   Collection Time: 02/17/21 12:48 PM   Specimen: Expectorated Sputum  Result Value Ref Range Status   M Tuberculosis Complex REFERT  Final    Comment: (NOTE) Please refer to the following specimen for additional lab results.      Reference [phone] for [phone] for BAL sourced specimen.      Shaune Leeks notified 02/20/2021 Per the College of American Pathologists (CAP) guidelines, a culture must be performed on all samples regardless of the molecular test result. By ordering this test code, the client has assumed responsibility for the performance of the culture.    Rifampin NOT PERFORMED  Final    Comment: Test not performed  MTB RIF NAA Non-Sputum, w/o Culture     Status: Abnormal (Preliminary result)   Collection Time: 02/17/21 12:48 PM  Result Value Ref Range Status   Source PENDING  Incomplete   Specimen Source Bronchial  Final   M tuberculosis complex Comment (A) NOT DETECTED Final    Comment: (NOTE) Mycobacterium tuberculosis complex (MTBC) detected. NOTIFIED/FAXED [place] ON [id number].(BL) FAX [phone] This test was developed and its performance characteristics determined by [facility]. It has not been cleared or approved by the Food and Drug Administration. Per the College of American Pathologists (CAP) guidelines, a culture must be performed on all samples regardless of the molecular test result. By ordering this  test code, the client has assumed responsibility for the performance of the culture.    Rifampin Comment Susceptible Final    Comment: (NOTE) Likely rifampin  susceptible; No rpoB mutation detected. This test was developed and its performance characteristics determined by [facility]. It has not been cleared or approved by the Food and Drug Administration.    AFB Specimen Processing Concentration  Final    Comment: (NOTE) Performed At: [facility] [place], [place] [id number] Rush Farmer MD [id number]          Radiology Studies: DG CHEST PORT 1 VIEW  Result Date: 02/25/2021 CLINICAL DATA:  Pneumothorax, chest tube, follow-up EXAM: PORTABLE CHEST 1 VIEW COMPARISON:  Portable exam 0555 hours compared to 02/24/2021 FINDINGS: Pigtail LEFT thoracostomy tube unchanged. Stable heart size mediastinal contours. Persistent large loculated pneumothorax lower LEFT chest with associated small pleural effusion. Atelectasis of lingula and LEFT lower lobe. Cavitary region within LEFT upper lobe unchanged. Diffuse infiltrates in RIGHT lung, greater at RIGHT lung base, unchanged. IMPRESSION: Persistent large loculated LEFT hydropneumothorax. Persistent cavitary region in LEFT upper lobe. RIGHT lung infiltrates especially at RIGHT lung base unchanged. Electronically Signed   By: Lavonia Dana M.D.   On: 02/25/2021 08:10   DG CHEST PORT 1 VIEW  Result Date: 02/24/2021 CLINICAL DATA:  Persistent pneumothorax EXAM: PORTABLE CHEST 1 VIEW COMPARISON:  February 23, 2021 FINDINGS: Chest tube on the left again noted with stable pneumothorax on the left. Consolidation throughout the left mid and lower lung zones remains without apparent change. Cavitary lesion left upper lobe unchanged. Diffuse reticulonodular stage ule prominence throughout the right lung is stable. Subtle ill-defined opacity right base may represent developing pneumonia in this area. Heart size within normal limits. Pulmonary  vascularity unchanged. No bone lesions. IMPRESSION: Question developing infiltrate right base with subtle airspace opacity in this area, new from 1 day prior. Diffuse reticulonodular opacities throughout the right lung remains. Stable pneumothorax on the left with chest tube in place. Consolidation left mid and lower lung regions as well as apparent cavitary lesion left upper lobe stable. Stable cardiac silhouette. Electronically Signed   By: Lowella Grip III M.D.   On: 02/24/2021 08:07        Scheduled Meds: . atorvastatin  10 mg Oral QPM  . calcitonin (salmon)  1 spray Alternating Nares Daily  . cholecalciferol  1,000 Units Oral Daily  . ethambutol  800 mg Oral Daily  . insulin aspart  0-5 Units Subcutaneous QHS  . insulin aspart  0-9 Units Subcutaneous TID WC  . insulin glargine  10 Units Subcutaneous Daily  . isoniazid  300 mg Oral Daily  . levothyroxine  50 mcg Oral QAC breakfast  . lidocaine  1 patch Transdermal Q24H  . pyrazinamide  1,000 mg Oral Daily  . vitamin B-6  50 mg Oral Daily  . 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 Left tension spontaneous hydro-pneumothorax/NEW pulmonary tuberculosis, large cavitary lesion left upper lobe, complicated with superinfection with bacterial pneumonia. Severe sepsis (end-organ failure hypoxemia), present on admission. Acute hypoxemic respiratory failure. -On 4 L satting mid 90s Continue inhalers cts following IS Flutter valve Continue abx ethambutol, isoniazid, rifampin, pyrazinamide Augmentin for superiposed bacterial infection, plan to stop after for 4 weeks,  on 03/18/19 Continue B6 supplementation with paroxetine Recurrent PTX stable since yesterday. CT functioning. CTS following May conisder transfer to tertiary center.   2. 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 Hypo-osmolar hyponatremia, not available urine electrolytes 3/16-stable.  Na improving , 134 today    3. New acute systolic heart failure. EF 20 to 25%, severe decreased LV systolic function, LV with severe akinesis of the left ventricule, mid apical anteroseptal wall and inferior wall.  volume status improving Received lasix  Hold beta blk and RAAS inh. Due to hypotension risk I/o Daily weight  4. T2DMuncontrolled with hyperglycemia/ dyslpidemia.  BG stable Continue RISS /insulin Continue statin    5. HypothyroidContinuelevothyroxine   6. Moderate calorie protein malnutrition.Continue with nutritional supplements. Patient will need home health services at home at discharge.   7. Thoracic vertebral compression fracture.Pain control with as neededmorphine and tramadol. On PRNalprazolam for anxiety.     DVT prophylaxis: SCD Code Status: Full Family Communication: None at bedside   Status is: Inpatient  Remains inpatient appropriate because:Inpatient level of care appropriate due to severity of illness   Dispo: The patient is from: Home              Anticipated d/c is to: Home              Patient currently is not medically stable to d/c.   Difficult to place patient No            LOS: 13 days   Time spent: 35 minutes with more than 50% on [place], MD [facility] Pager [phone]  If 7PM-7AM, please contact night-coverage 02/25/2021, 3:18 PM

## 2021-02-25 NOTE — Progress Notes (Signed)
PT Cancellation Note  Patient Details Name: [name] [name] MRN: [id number] DOB: January 29, 1956   Cancelled Treatment:    Reason Eval/Treat Not Completed: Patient declined, no reason specified.  Pt's session with OT was limited due to dizziness and pt wanting to go quickly back to supine position.  Will wait until 3/17 to see this patient due to limited participate and drive to stay OOB by this patient. 02/25/2021  Jacinto Halim., PT Acute Rehabilitation Services [phone]  (pager) [phone]  (office)   Eliseo Gum Felina Tello 02/25/2021, 5:36 PM

## 2021-02-25 NOTE — Progress Notes (Signed)
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  AFB sputum culture smear positive, cultures pending from 02/13/21.  MTB NAAT also positive.  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   Continue RIPE therapy and vitamin B6 (started 02/20/21) Monitor LFT's Maintain airborne isolation Follow up results AFB cultures I think most of what is going on is related to pulmonary TB and will stop her Augmentin as suspicion for secondary bacterial process is lower Will need patient to be referred to [facility] for post-discharge DOT tx of TB. [name] [name] is dhs tb nurse POC (phone [phone]) CT surgery recommendations noted regarding possible transfer to tertiary center given size of her cavitary lesion and that air leak will likely not resolve without complex resection and interpositional muscle flap Will follow     SUBJECTIVE:   24 hour events:  No acute events noted Afebrile  Patient reports she is doing "so so".  She denies fevers, chills.  Reports breathing unchanged.  She wants to know why she is on so many antibiotics.  Discussed this was needed in order to treat TB.    OBJECTIVE:   Blood pressure (!) 92/56, pulse (!) 105, temperature (!) 97.5 F (36.4 C), temperature source Oral, resp. rate (!) 45, height  5\' 6"  (1.676 m), weight 41.4 kg, SpO2 95 %. Body mass index is 14.73 kg/m.     Lab Results: Lab Results  Component Value Date   WBC 13.1 (H) 02/25/2021   HGB 12.3 02/25/2021   HCT 37.2 02/25/2021   MCV 78.0 (L) 02/25/2021   PLT 233 02/25/2021    Lab Results  Component Value Date   NA 134 (L) 02/25/2021   K 3.6 02/25/2021   CO2 30 02/25/2021   GLUCOSE 83 02/25/2021   BUN 17 02/25/2021   CREATININE 0.42 (L) 02/25/2021   CALCIUM 8.2 (L) 02/25/2021   GFRNONAA >60 02/25/2021   GFRAA 114 01/19/2008    Lab Results  Component Value Date   ALT 22 02/24/2021   AST 36 02/24/2021   ALKPHOS 157 (H) 02/24/2021   BILITOT 0.8 02/24/2021    No results found for: CRP  No results found for: ESRSEDRATE   I have reviewed the micro and lab results in Epic.  Imaging: DG CHEST PORT 1 VIEW  Result Date: 02/25/2021 CLINICAL DATA:  Pneumothorax, chest tube, follow-up EXAM: PORTABLE CHEST 1 VIEW COMPARISON:  Portable exam 0555 hours compared to 02/24/2021 FINDINGS: Pigtail LEFT thoracostomy tube unchanged. Stable heart size mediastinal contours. Persistent large loculated pneumothorax lower LEFT chest with associated small pleural effusion. Atelectasis of lingula and LEFT lower lobe. Cavitary region within LEFT upper lobe unchanged. Diffuse infiltrates in RIGHT lung, greater at RIGHT lung base, unchanged. IMPRESSION: Persistent large loculated LEFT hydropneumothorax. Persistent cavitary region in LEFT upper lobe. RIGHT lung infiltrates  especially at RIGHT lung base unchanged. Electronically Signed   By: Ulyses Southward M.D.   On: 02/25/2021 08:10   DG CHEST PORT 1 VIEW  Result Date: 02/24/2021 CLINICAL DATA:  Persistent pneumothorax EXAM: PORTABLE CHEST 1 VIEW COMPARISON:  February 23, 2021 FINDINGS: Chest tube on the left again noted with stable pneumothorax on the left. Consolidation throughout the left mid and lower lung zones remains without apparent change. Cavitary lesion left upper lobe  unchanged. Diffuse reticulonodular stage ule prominence throughout the right lung is stable. Subtle ill-defined opacity right base may represent developing pneumonia in this area. Heart size within normal limits. Pulmonary vascularity unchanged. No bone lesions. IMPRESSION: Question developing infiltrate right base with subtle airspace opacity in this area, new from 1 day prior. Diffuse reticulonodular opacities throughout the right lung remains. Stable pneumothorax on the left with chest tube in place. Consolidation left mid and lower lung regions as well as apparent cavitary lesion left upper lobe stable. Stable cardiac silhouette. Electronically Signed   By: Bretta Bang III M.D.   On: 02/24/2021 08:07     Imaging  independently reviewed in Epic.    Vedia Coffer for Infectious Disease [facility] [phone] pager 02/25/2021, 12:01 PM

## 2021-02-26 DIAGNOSIS — A150 Tuberculosis of lung: Secondary | ICD-10-CM | POA: Diagnosis not present

## 2021-02-26 LAB — GLUCOSE, CAPILLARY
Glucose-Capillary: 74 mg/dL (ref 70–99)
Glucose-Capillary: 149 mg/dL — ABNORMAL HIGH (ref 70–99)
Glucose-Capillary: 82 mg/dL (ref 70–99)
Glucose-Capillary: 138 mg/dL — ABNORMAL HIGH (ref 70–99)

## 2021-02-26 NOTE — Progress Notes (Signed)
      [address]       [place] [address]             [phone]       10 Days Post-Op Procedure(s) (LRB): VIDEO BRONCHOSCOPY WITH FLUORO (Left) BRONCHIAL BIOPSIES BRONCHIAL BRUSHINGS BRONCHIAL WASHINGS  Subjective: Resting in bed. Awakened easily. Reports no change in her breathing.  On 4L  O2, sats in mid 90's.  Objective: Vital signs in last 24 hours: Temp:  [97.3 F (36.3 C)-98.5 F (36.9 C)] 98.5 F (36.9 C) (03/17 1100) Pulse Rate:  [44-108] 92 (03/17 1100) Cardiac Rhythm: Normal sinus rhythm (03/17 0700) Resp:  [17-46] 24 (03/17 1100) BP: (75-114)/(54-94) 101/78 (03/17 1100) SpO2:  [89 %-100 %] 93 % (03/17 1100) Weight:  [42.4 kg-42.6 kg] 42.6 kg (03/17 0517)      Intake/Output from previous day: 03/16 0701 - 03/17 0700 In: 480 [P.O.:480] Out: 1300 [Urine:1150; Chest Tube:150]   Physical Exam: General:  Awake and alert, appears to be breathing comfortably at rest.  Cardiovascular: HR 90-110, NSR Pulmonary: Coarse breath sounds, and diminished on left, unchanged.  No CXR this AM.  Abdomen: Soft, non tender Extremities: Mild bilateral lower extremity edema. Chest Tube: to suction, has a large, continuous air leak. The chest tube and connections are secure.    Lab Results: CBC: Recent Labs    02/24/21 0327 02/25/21 0453  WBC 11.7* 13.1*  HGB 11.5* 12.3  HCT 34.9* 37.2  PLT 271 233   BMET:  Recent Labs    02/24/21 0327 02/25/21 0453  NA 132* 134*  K 4.0 3.6  CL 97* 97*  CO2 31 30  GLUCOSE 108* 83  BUN 18 17  CREATININE 0.34* 0.42*  CALCIUM 8.5* 8.2*     Assessment/Plan:   -64yo former smoker with 15kg Wt loss over the past 3 months admitted with a left tension PTX that initially resolved with insertion of a left pigtail pleural tube by the ED physician. AFB smear from expectorated sputm now positive.  Pt has TB with superimposed pneumonia per ID. Multidrug therapy initiated.    -Recurrent PTX has been stable. Per  Dr. Cliffton Asters:  The air leak is not likely to resolve without complex surgery for which she is not a candidate given her malnutrition and debilitated state.   No CXR today, will repeat in AM.   Leary Roca, PA-C 02/26/2021,11:08 AM [phone]

## 2021-02-26 NOTE — Progress Notes (Signed)
PT Cancellation Note  Patient Details Name: [name] [name] MRN: [id number] DOB: [birth year]   Cancelled Treatment:    Reason Eval/Treat Not Completed: Patient declined, no reason specified. Will follow-up for PT treatment as schedule permits and pt willing to participate.  Ina Homes, PT, DPT Acute Rehabilitation Services  Pager [phone] Office [phone]  Malachy Chamber 02/26/2021, 4:47 PM

## 2021-02-26 NOTE — Progress Notes (Signed)
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 Acute hypoxemic respiratory failure. New systolic heart failure.   65 year old female past medical history for type 2 diabetes mellitus, recent vertebral fracture, tobacco abuse, dyslipidemia and hypothyroidism. As an outpatient she was diagnosed with pneumothorax and referred to the hospital. In the ED she was found to have a 90% left tension hydropneumothorax with severe compression atelectasis of the lung medially and marked mediastinal shift. She had a left chest tube placed with good toleration. Her blood pressure was 146/81, heart rate 94, respiratory rate 23, oxygen saturation 98%. She had increased work of breathing, left-sided chest tube in place with decreased breath sounds bilaterally. Heart S1-S2, present rhythmic, soft abdomen, no lower extremity edema.  Sodium 133, potassium 4.3, chloride 97, bicarb 22, glucose 243, BUN 8, creatinine 0.50, white count 9.3, hemoglobin 12.4, hematocrit 38.8, platelets 578. SARS COVID-19 negative.  Chest radiograph with tension left-sided pneumothorax with mediastinal deviation to the right. EKG 118 bpm, normal axis, normal intervals, sinus rhythm, no ST segment T wave changes, positive LVH per EKG criteria.  Follow-up CT chest showed cavitary lesion. She was placed on broad-spectrum antibiotic therapy. Underwent bronchoscopy showing significant amount of purulent secretions on the left side.  She was placed on respiratory isolation, possible TB.  Not able to clamp chest tube due to recurrent pneumothorax.   Patient had worsening oxygen requirements, placed on heated high flow nasal cannula up to 30  L/min.  She continue to be very weak and deconditioned, microbiology confirmed pulmonary TB (2+AFB), and specific antibiotic was started.   Patient with hypotension, and worsening hypoxemia, back on heated high flow nasal cannula. Started on TB antibiotic regimen 02/20/21  Persistent pneumothorax, with positive air leak, not able to clamp chest tube.  Patient may need decortication per CT surgery, but her physical functional status is very low due to severe deconditioning.   She has a high respiratory rate, rapid shallow breathing, due to asvance deconditioning.   Improved oxygen requirements down to 4 L/min per [place].   3/17 pt not very hungry today. Offered protein shakes but declined  Consultants:   ID, PCCM, cardiothoracic, nutrition  Procedures:   Antimicrobials:       Subjective: No new complaints.   Objective: Vitals:   02/26/21 0900 02/26/21 1000 02/26/21 1100 02/26/21 1155  BP: 101/72 (!) 75/57 101/78 101/78  Pulse: (!) 102 88 92 94  Resp: (!) 35 (!) 33 (!) 24 16  Temp:   98.5 F (36.9 C) 98.5 F (36.9 C)  TempSrc:   Oral Oral  SpO2: 96% 96% 93% 95%  Weight:      Height:        Intake/Output Summary (Last 24 hours) at 02/26/2021 1702 Last data filed at 02/26/2021 0500 Gross per 24 hour  Intake 480 ml  Output 350 ml  Net 130 ml   Filed Weights   02/25/21 0500 02/26/21 0000 02/26/21 0517  Weight: 41.4 kg 42.4 kg 42.6 kg    Examination:  nad , comfortable cta no w Regular s1/s2 no gallop Soft benign +bs No edema Awake and alert. Grossly intact   Data Reviewed: I have personally reviewed following labs and imaging studies  CBC: Recent Labs  Lab 02/20/21 0232 02/22/21 0521 02/23/21 0352  02/24/21 0327 02/25/21 0453  WBC 7.2 11.4* 16.1* 11.7* 13.1*  NEUTROABS 6.0 9.4* 14.1* 9.9* 10.7*  HGB 10.5* 12.2 11.6* 11.5* 12.3  HCT 32.2* 36.0 34.7* 34.9* 37.2  MCV 79.9* 77.8* 78.5* 79.1* 78.0*  PLT 393 433* 276 271 092   Basic Metabolic  Panel: Recent Labs  Lab 02/21/21 0416 02/22/21 0521 02/23/21 0352 02/24/21 0327 02/25/21 0453  NA 127* 127* 132* 132* 134*  K 3.9 4.4 4.4 4.0 3.6  CL 91* 93* 96* 97* 97*  CO2 _0 GLUCOSE 276* 359* 166* 108* 83  BUN 15 27* 24* 18 17  CREATININE 0.53 0.58 0.45 0.34* 0.42*  CALCIUM 8.5* 8.5* 8.7* 8.5* 8.2*   GFR: Estimated Creatinine Clearance: 49 mL/min (A) (by C-G formula based on SCr of 0.42 mg/dL (L)). Liver Function Tests: Recent Labs  Lab 02/24/21 0327  AST 36  ALT 22  ALKPHOS 157*  BILITOT 0.8  PROT 5.1*  ALBUMIN 1.5*   No results for input(s): LIPASE, AMYLASE in the last 168 hours. No results for input(s): AMMONIA in the last 168 hours. Coagulation Profile: No results for input(s): INR, PROTIME in the last 168 hours. Cardiac Enzymes: No results for input(s): CKTOTAL, CKMB, CKMBINDEX, TROPONINI in the last 168 hours. BNP (last 3 results) No results for input(s): PROBNP in the last 8760 hours. HbA1C: No results for input(s): HGBA1C in the last 72 hours. CBG: Recent Labs  Lab 02/25/21 1606 02/25/21 2052 02/26/21 0521 02/26/21 1154 02/26/21 1610  GLUCAP 85 118* 74 149* 82   Lipid Profile: No results for input(s): CHOL, HDL, LDLCALC, TRIG, CHOLHDL, LDLDIRECT in the last 72 hours. Thyroid Function Tests: No results for input(s): TSH, T4TOTAL, FREET4, T3FREE, THYROIDAB in the last 72 hours. Anemia Panel: No results for input(s): VITAMINB12, FOLATE, FERRITIN, TIBC, IRON, RETICCTPCT in the last 72 hours. Sepsis Labs: No results for input(s): PROCALCITON, LATICACIDVEN in the last 168 hours.  Recent Results (from the past 240 hour(s))  Blastomyces Antigen     Status: None   Collection Time: 02/17/21 10:29 AM   Specimen: Blood  Result Value Ref Range Status   Blastomyces Antigen None Detected None Detected ng/mL Final    Comment: (NOTE) Results reported as ng/mL in 0.2 - 14.7 ng/mL range Results above the limit of detection but below 0.2  ng/mL are reported as 'Positive, Below the Limit of Quantification' Results above 14.7 ng/mL are reported as 'Positive, Above the Limit of Quantification'    Specimen Type SERUM  Final    Comment: (NOTE) Performed At: [place], [place] [id number] Bruce Donath MD [id number]   MTB RIF NAA w/o Culture, Sputum     Status: None   Collection Time: 02/17/21 12:48 PM   Specimen: Expectorated Sputum  Result Value Ref Range Status   M Tuberculosis Complex REFERT  Final    Comment: (NOTE) Please refer to the following specimen for additional lab results.      Reference [id number] for [phone] for BAL sourced specimen.      Shaune Leeks notified 02/20/2021 Per the College of American Pathologists (CAP) guidelines, a culture must be performed on all samples regardless of the molecular test result. By ordering this test code, the client has assumed responsibility for the performance of the culture.    Rifampin NOT PERFORMED  Final    Comment: Test not performed  MTB RIF NAA Non-Sputum, w/o Culture     Status: Abnormal (Preliminary result)  Collection Time: 02/17/21 12:48 PM  Result Value Ref Range Status   Source PENDING  Incomplete   Specimen Source Bronchial  Final   M tuberculosis complex Comment (A) NOT DETECTED Final    Comment: (NOTE) Mycobacterium tuberculosis complex (MTBC) detected. NOTIFIED/FAXED [place] ON [id number].(BL) FAX [phone] This test was developed and its performance characteristics determined by [facility]. It has not been cleared or approved by the Food and Drug Administration. Per the College of American Pathologists (CAP) guidelines, a culture must be performed on all samples regardless of the molecular test result. By ordering this test code, the client has assumed responsibility for the performance of the culture.    Rifampin Comment Susceptible Final    Comment: (NOTE) Likely rifampin susceptible;  No rpoB mutation detected. This test was developed and its performance characteristics determined by [facility]. It has not been cleared or approved by the Food and Drug Administration.    AFB Specimen Processing Concentration  Final    Comment: (NOTE) Performed At: [facility] [place], [place] [id number] Rush Farmer MD YT:[id number]          Radiology Studies: DG CHEST PORT 1 VIEW  Result Date: 02/25/2021 CLINICAL DATA:  Pneumothorax, chest tube, follow-up EXAM: PORTABLE CHEST 1 VIEW COMPARISON:  Portable exam 0555 hours compared to 02/24/2021 FINDINGS: Pigtail LEFT thoracostomy tube unchanged. Stable heart size mediastinal contours. Persistent large loculated pneumothorax lower LEFT chest with associated small pleural effusion. Atelectasis of lingula and LEFT lower lobe. Cavitary region within LEFT upper lobe unchanged. Diffuse infiltrates in RIGHT lung, greater at RIGHT lung base, unchanged. IMPRESSION: Persistent large loculated LEFT hydropneumothorax. Persistent cavitary region in LEFT upper lobe. RIGHT lung infiltrates especially at RIGHT lung base unchanged. Electronically Signed   By: Lavonia Dana M.D.   On: 02/25/2021 08:10        Scheduled Meds: . atorvastatin  10 mg Oral QPM  . calcitonin (salmon)  1 spray Alternating Nares Daily  . cholecalciferol  1,000 Units Oral Daily  . ethambutol  800 mg Oral Daily  . insulin aspart  0-5 Units Subcutaneous QHS  . insulin aspart  0-9 Units Subcutaneous TID WC  . insulin glargine  10 Units Subcutaneous Daily  . isoniazid  300 mg Oral Daily  . levothyroxine  50 mcg Oral QAC breakfast  . lidocaine  1 patch Transdermal Q24H  . pyrazinamide  1,000 mg Oral Daily  . vitamin B-6  50 mg Oral Daily  . 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 Left tension spontaneous hydro-pneumothorax/NEW pulmonary tuberculosis, large cavitary lesion left upper lobe, complicated with superinfection with bacterial pneumonia. Severe sepsis (end-organ failure hypoxemia), present on admission. Acute hypoxemic respiratory failure. -On 4 L satting mid 90s Continue inhalers cts following IS Flutter valve Continue abx ethambutol, isoniazid, rifampin, pyrazinamide Augmentin for superiposed bacterial infection, plan to stop after for 4 weeks, on 03/18/19 Continue B6 supplementation with paroxetine Recurrent PTX stable since yesterday. CT functioning. CTS following May conisder transfer to tertiary center. 3/18- cts following- no cxr today, will repeat in am. Send message to  cts about transfer to tertiary care center if able to help coordinate it with surgeons there, awaiting call back.  2. 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 Hypo-osmolar hyponatremia, not available urine electrolytes 3/17-stable.   Continue to monitor.  On      3. New acute systolic heart failure. EF 20 to 25%, severe decreased LV systolic function, LV with severe akinesis of the left ventricule, mid apical anteroseptal wall and inferior wall.  3/17-volume status improving Received lasix Hold beta blk and RAAS inh due to hypotension I/o Daily weight Will consult cardilogy if pt will continue being here   4. T2DMuncontrolled with hyperglycemia/ dyslpidemia.  bg stable Continue riss/insulin Encouraged po intake Continue statin     5. HypothyroidContinuelevothyroxine   6. Moderate calorie protein malnutrition.Continue with nutritional supplements. Patient will need home health services at home at discharge.   7.  Thoracic vertebral compression fracture.Pain control with as neededmorphine and tramadol. On PRNalprazolam for anxiety.     DVT prophylaxis: SCD Code Status: Full Family Communication: None at bedside   Status is: Inpatient  Remains inpatient appropriate because:Inpatient level of care appropriate due to severity of illness   Dispo: The patient is from: Home              Anticipated d/c is to: Home              Patient currently is not medically stable to d/c.   Difficult to place patient No            LOS: 14 days   Time spent: 35 minutes with more than 50% on [place], MD [facility] Pager [phone]  If 7PM-7AM, please contact night-coverage 02/26/2021, 5:02 PM

## 2021-02-26 NOTE — TOC Progression Note (Signed)
Transition of Care [facility]) - Progression Note    Patient Details  Name: [name] [name] MRN: [id number] Date of Birth: 24-Jun-1956  Transition of Care [facility]) CM/SW Contact  Bess Kinds, RN Phone Number: [phone] 02/26/2021, 9:56 AM  Clinical Narrative:     Received call from Lara Mulch who is a TB nurse with [facility]. Tammy will need to be contacted prior to discharge (preferably the day before) in order to arrange home visits for for patient's TB medication. Tammy's contact information is [phone]. Patient to be provided this information as well - information has been placed on AVS.   Tammy also requesting repeat of quantiferon today. Notified MD of request. Results to be faxed by [facility] to Tammy and Dr. Starr Sinclair at [facility] TB program [phone] when results available.   TOC following for transition needs.        Expected Discharge Plan and Services                                                 Social Determinants of Health (SDOH) Interventions    Readmission Risk Interventions No flowsheet data found.

## 2021-02-27 ENCOUNTER — Inpatient Hospital Stay (HOSPITAL_COMMUNITY): Payer: BLUE CROSS/BLUE SHIELD

## 2021-02-27 DIAGNOSIS — Z515 Encounter for palliative care: Secondary | ICD-10-CM

## 2021-02-27 DIAGNOSIS — A150 Tuberculosis of lung: Secondary | ICD-10-CM | POA: Diagnosis not present

## 2021-02-27 DIAGNOSIS — Z7189 Other specified counseling: Secondary | ICD-10-CM

## 2021-02-27 LAB — GLUCOSE, CAPILLARY
Glucose-Capillary: 132 mg/dL — ABNORMAL HIGH (ref 70–99)
Glucose-Capillary: 162 mg/dL — ABNORMAL HIGH (ref 70–99)
Glucose-Capillary: 78 mg/dL (ref 70–99)

## 2021-02-27 MED ORDER — DEXTROSE 50 % IV SOLN
INTRAVENOUS | Status: AC
  Filled 2021-02-27: qty 50

## 2021-02-27 NOTE — Progress Notes (Addendum)
      [address]       [place] [address]             [phone]      11 Days Post-Op Procedure(s) (LRB): VIDEO BRONCHOSCOPY WITH FLUORO (Left) BRONCHIAL BIOPSIES BRONCHIAL BRUSHINGS BRONCHIAL WASHINGS Subjective: Says breathing is slightly improved  Objective: Vital signs in last 24 hours: Temp:  [97.5 F (36.4 C)-98.6 F (37 C)] 97.5 F (36.4 C) (03/18 0400) Pulse Rate:  [77-102] 85 (03/18 0400) Cardiac Rhythm: Normal sinus rhythm (03/18 0700) Resp:  [16-35] 22 (03/18 0400) BP: (75-102)/(55-78) 99/72 (03/18 0400) SpO2:  [93 %-100 %] 96 % (03/18 0400) Weight:  [44.1 kg] 44.1 kg (03/18 0100)  Hemodynamic parameters for last 24 hours:    Intake/Output from previous day: 03/17 0701 - 03/18 0700 In: 240 [P.O.:240] Out: 300 [Chest Tube:300] Intake/Output this shift: No intake/output data recorded.  General appearance: fatigued and mild distress Heart: regular rate and rhythm Lungs: coarse BS  Lab Results: Recent Labs    02/25/21 0453  WBC 13.1*  HGB 12.3  HCT 37.2  PLT 233   BMET:  Recent Labs    02/25/21 0453  NA 134*  K 3.6  CL 97*  CO2 30  GLUCOSE 83  BUN 17  CREATININE 0.42*  CALCIUM 8.2*    PT/INR: No results for input(s): LABPROT, INR in the last 72 hours. ABG No results found for: PHART, HCO3, TCO2, ACIDBASEDEF, O2SAT CBG (last 3)  Recent Labs    02/26/21 1610 02/26/21 2126 02/27/21 0517  GLUCAP 82 138* 132*    Meds Scheduled Meds: . atorvastatin  10 mg Oral QPM  . calcitonin (salmon)  1 spray Alternating Nares Daily  . cholecalciferol  1,000 Units Oral Daily  . ethambutol  800 mg Oral Daily  . insulin aspart  0-5 Units Subcutaneous QHS  . insulin aspart  0-9 Units Subcutaneous TID WC  . insulin glargine  10 Units Subcutaneous Daily  . isoniazid  300 mg Oral Daily  . levothyroxine  50 mcg Oral QAC breakfast  . lidocaine  1 patch Transdermal Q24H  . pyrazinamide  1,000 mg Oral Daily  . vitamin B-6  50 mg  Oral Daily  . rifampin  600 mg Oral Daily   Continuous Infusions: PRN Meds:.acetaminophen **OR** acetaminophen, albuterol, ALPRAZolam, Ipratropium-Albuterol, morphine injection, traMADol  Xrays No results found.  Assessment/Plan: S/P Procedure(s) (LRB): VIDEO BRONCHOSCOPY WITH FLUORO (Left) BRONCHIAL BIOPSIES BRONCHIAL BRUSHINGS BRONCHIAL WASHINGS  1 afeb, some hypotension 2 sats ok on 2 liters 3 CXR stable in appearance, CT conts with large air leak 4 with no surgical options, recommend palliative consult- she appears to be giving  Up with limited nutrition intake and refusing therapies   LOS: 15 days    Rowe Clack PA-C Pager [phone] 02/27/2021  Agree with above Poor surgical candidate Given nutritional status, surgical intervention would cary high mortality risk Discussed with colleagues, and all agree Palliative care.  Eamonn Sermeno Keane Scrape

## 2021-02-27 NOTE — Progress Notes (Signed)
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 Acute hypoxemic respiratory failure. New systolic heart failure.   65 year old female past medical history for type 2 diabetes mellitus, recent vertebral fracture, tobacco abuse, dyslipidemia and hypothyroidism. As an outpatient she was diagnosed with pneumothorax and referred to the hospital. In the ED she was found to have a 90% left tension hydropneumothorax with severe compression atelectasis of the lung medially and marked mediastinal shift. She had a left chest tube placed with good toleration. Her blood pressure was 146/81, heart rate 94, respiratory rate 23, oxygen saturation 98%. She had increased work of breathing, left-sided chest tube in place with decreased breath sounds bilaterally. Heart S1-S2, present rhythmic, soft abdomen, no lower extremity edema.  Sodium 133, potassium 4.3, chloride 97, bicarb 22, glucose 243, BUN 8, creatinine 0.50, white count 9.3, hemoglobin 12.4, hematocrit 38.8, platelets 578. SARS COVID-19 negative.  Chest radiograph with tension left-sided pneumothorax with mediastinal deviation to the right. EKG 118 bpm, normal axis, normal intervals, sinus rhythm, no ST segment T wave changes, positive LVH per EKG criteria.  Follow-up CT chest showed cavitary lesion. She was placed on broad-spectrum antibiotic therapy. Underwent bronchoscopy showing significant amount of purulent secretions on the left side.  She was placed on respiratory isolation, possible TB.  Not able to clamp chest tube due to recurrent pneumothorax.   Patient had worsening oxygen requirements, placed on heated high flow nasal cannula up to 30  L/min.  She continue to be very weak and deconditioned, microbiology confirmed pulmonary TB (2+AFB), and specific antibiotic was started.   Patient with hypotension, and worsening hypoxemia, back on heated high flow nasal cannula. Started on TB antibiotic regimen 02/20/21  Persistent pneumothorax, with positive air leak, not able to clamp chest tube.  Patient may need decortication per CT surgery, but her physical functional status is very low due to severe deconditioning.   She has a high respiratory rate, rapid shallow breathing, due to asvance deconditioning.   Improved oxygen requirements down to 4 L/min per .   3/17 pt not very hungry today. Offered protein shakes but declined 3/18- no complaints. Still with decrease po intake and appetite. Earlier this am had vasovagal event in the bathroom and RR was called.currently stable asx   Consultants:   ID, PCCM, cardiothoracic, nutrition  Procedures:   Antimicrobials:       Subjective:   Objective: Vitals:   02/26/21 1155 02/26/21 2000 02/27/21 0100 02/27/21 0400  BP: 101/78 102/68 (!) 95/55 99/72  Pulse: 94 77 78 85  Resp: 16 (!) 21 (!) 21 (!) 22  Temp: 98.5 F (36.9 C) 98.6 F (37 C) 97.8 F (36.6 C) (!) 97.5 F (36.4 C)  TempSrc: Oral Oral Oral Oral  SpO2: 95% 100% 97% 96%  Weight:   44.1 kg   Height:        Intake/Output Summary (Last 24 hours) at 02/27/2021 0833 Last data filed at 02/27/2021 0500 Gross per 24 hour  Intake 240 ml  Output 300 ml  Net -60 ml   Filed Weights   02/26/21 0000 02/26/21 0517 02/27/21 0100  Weight: 42.4 kg 42.6 kg 44.1 kg    Examination: Calm, NAD CTA anteriorly Regular S1-S2 no gallops Soft benign positive bowel sounds No edema Grossly  intact   Data Reviewed: I have personally reviewed following labs and imaging studies  CBC: Recent Labs  Lab 02/22/21 0521 02/23/21 0352 02/24/21 0327 02/25/21 0453  WBC 11.4* 16.1* 11.7* 13.1*  NEUTROABS 9.4* 14.1*  9.9* 10.7*  HGB 12.2 11.6* 11.5* 12.3  HCT 36.0 34.7* 34.9* 37.2  MCV 77.8* 78.5* 79.1* 78.0*  PLT 433* 276 271 163   Basic Metabolic Panel: Recent Labs  Lab 02/21/21 0416 02/22/21 0521 02/23/21 0352 02/24/21 0327 02/25/21 0453  NA 127* 127* 132* 132* 134*  K 3.9 4.4 4.4 4.0 3.6  CL 91* 93* 96* 97* 97*  CO2 _0 GLUCOSE 276* 359* 166* 108* 83  BUN 15 27* 24* 18 17  CREATININE 0.53 0.58 0.45 0.34* 0.42*  CALCIUM 8.5* 8.5* 8.7* 8.5* 8.2*   GFR: Estimated Creatinine Clearance: 50.8 mL/min (A) (by C-G formula based on SCr of 0.42 mg/dL (L)). Liver Function Tests: Recent Labs  Lab 02/24/21 0327  AST 36  ALT 22  ALKPHOS 157*  BILITOT 0.8  PROT 5.1*  ALBUMIN 1.5*   No results for input(s): LIPASE, AMYLASE in the last 168 hours. No results for input(s): AMMONIA in the last 168 hours. Coagulation Profile: No results for input(s): INR, PROTIME in the last 168 hours. Cardiac Enzymes: No results for input(s): CKTOTAL, CKMB, CKMBINDEX, TROPONINI in the last 168 hours. BNP (last 3 results) No results for input(s): PROBNP in the last 8760 hours. HbA1C: No results for input(s): HGBA1C in the last 72 hours. CBG: Recent Labs  Lab 02/26/21 0521 02/26/21 1154 02/26/21 1610 02/26/21 2126 02/27/21 0517  GLUCAP 74 149* 82 138* 132*   Lipid Profile: No results for input(s): CHOL, HDL, LDLCALC, TRIG, CHOLHDL, LDLDIRECT in the last 72 hours. Thyroid Function Tests: No results for input(s): TSH, T4TOTAL, FREET4, T3FREE, THYROIDAB in the last 72 hours. Anemia Panel: No results for input(s): VITAMINB12, FOLATE, FERRITIN, TIBC, IRON, RETICCTPCT in the last 72 hours. Sepsis Labs: No results for input(s): PROCALCITON, LATICACIDVEN in the last 168 hours.  Recent Results (from the past 240 hour(s))  Blastomyces Antigen     Status: None   Collection Time: 02/17/21 10:29 AM   Specimen: Blood  Result Value Ref Range Status   Blastomyces Antigen None Detected None Detected  ng/mL Final    Comment: (NOTE) Results reported as ng/mL in 0.2 - 14.7 ng/mL range Results above the limit of detection but below 0.2 ng/mL are reported as 'Positive, Below the Limit of Quantification' Results above 14.7 ng/mL are reported as 'Positive, Above the Limit of Quantification'    Specimen Type SERUM  Final    Comment: (NOTE) Performed At: [place], [place] [id number] Bruce Donath MD HO:[id number]   MTB RIF NAA w/o Culture, Sputum     Status: None   Collection Time: 02/17/21 12:48 PM   Specimen: Expectorated Sputum  Result Value Ref Range Status   M Tuberculosis Complex REFERT  Final    Comment: (NOTE) Please refer to the following specimen for additional lab results.      Reference [phone] for [phone] for BAL sourced specimen.      Shaune Leeks notified 02/20/2021 Per the College of American Pathologists (CAP) guidelines, a culture must be performed on all samples regardless of the molecular test result. By ordering this test code, the client has assumed responsibility for the performance of the culture.    Rifampin NOT PERFORMED  Final    Comment: Test  not performed  MTB RIF NAA Non-Sputum, w/o Culture     Status: Abnormal (Preliminary result)   Collection Time: 02/17/21 12:48 PM  Result Value Ref Range Status   Source PENDING  Incomplete   Specimen Source Bronchial  Final   M tuberculosis complex Comment (A) NOT DETECTED Final    Comment: (NOTE) Mycobacterium tuberculosis complex (MTBC) detected. NOTIFIED/FAXED [place] ON [id number].(BL) FAX [phone] This test was developed and its performance characteristics determined by [facility]. It has not been cleared or approved by the Food and Drug Administration. Per the College of American Pathologists (CAP) guidelines, a culture must be performed on all samples regardless of the molecular test result. By ordering this test code, the client has  assumed responsibility for the performance of the culture.    Rifampin Comment Susceptible Final    Comment: (NOTE) Likely rifampin susceptible; No rpoB mutation detected. This test was developed and its performance characteristics determined by [facility]. It has not been cleared or approved by the Food and Drug Administration.    AFB Specimen Processing Concentration  Final    Comment: (NOTE) Performed At: [facility] [place], [place] [id number] Rush Farmer MD MV:[id number]          Radiology Studies: DG CHEST PORT 1 VIEW  Result Date: 02/27/2021 CLINICAL DATA:  Pneumothorax EXAM: PORTABLE CHEST 1 VIEW COMPARISON:  Portable exam 0640 hours compared to 02/25/2021 FINDINGS: Pigtail thoracostomy tube again identified, pigtail near the lateral costal margin. Again identified loculated pneumothorax at the lateral LEFT inferior hemithorax with significant atelectasis of the LEFT lung. Cavitary lesion in the LEFT upper lobe unchanged. Diffuse infiltrates RIGHT lung unchanged. Decrease in LEFT pleural effusion. No RIGHT pneumothorax or acute osseous findings. IMPRESSION: Persistent loculated LEFT basilar pneumothorax with extensive atelectasis of LEFT lung and persistent LEFT upper lobe cavitary lesion. Persistent diffuse RIGHT lung infiltrates. Electronically Signed   By: Lavonia Dana M.D.   On: 02/27/2021 08:21        Scheduled Meds:  atorvastatin  10 mg Oral QPM   calcitonin ([place])  1 spray Alternating Nares Daily   cholecalciferol  1,000 Units Oral Daily   ethambutol  800 mg Oral Daily   insulin aspart  0-5 Units Subcutaneous QHS   insulin aspart  0-9 Units Subcutaneous TID WC   insulin glargine  10 Units Subcutaneous Daily   isoniazid  300 mg Oral Daily   levothyroxine  50 mcg Oral QAC breakfast   lidocaine  1 patch Transdermal Q24H   pyrazinamide  1,000 mg Oral Daily   vitamin B-6  50 mg Oral Daily   rifampin  600 mg Oral Daily    Continuous Infusions:  Assessment & Plan:   Principal Problem:   Pulmonary TB Active Problems:   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 Left tension spontaneous hydro-pneumothorax/NEW pulmonary tuberculosis, large cavitary lesion left upper lobe, complicated with superinfection with bacterial pneumonia. Severe sepsis (end-organ failure hypoxemia), present on admission. Acute hypoxemic respiratory failure. CTS and ID following Augmentin for superiposed bacterial infection, plan to stop after for 4 weeks, on 03/18/19 Continue B6 supplementation with paroxetine Recurrent PTX stable since yesterday. CT functioning. CTS following May conisder transfer to tertiary center. 3/18-CTS following patient is  not a good surgical candidate due to her nutrition status and refusing therapies Palliative care was consulted Repeat chest x-ray appears to be stable Chest tube continues with large air leak CTS and ID following Continue ethambutol, INH, rifampin, pyrazinamide (started 02/20/21) F/u with health dept, for continued TB tx at discharge   2. 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 Hypo-osmolar hyponatremia, not available urine electrolytes 3/18-stable Continue to monitor      3. New acute systolic heart failure. EF 20 to 25%, severe decreased LV systolic function, LV with severe akinesis of the left ventricule, mid apical anteroseptal wall and inferior wall.  3/18-volume status stable Received Lasix Hold beta-blockers RAAS inhibitors due to hypotension I's and O's, daily weight     4. T2DMuncontrolled with hyperglycemia/ dyslpidemia.  Blood glucose stable Continue R-ISS/insulin Continue  statin Encourage p.o. intake      5. HypothyroidContinuelevothyroxine   6. Moderate calorie protein malnutrition.Continue with nutritional supplements. Patient will need home health services at home at discharge.   7. Thoracic vertebral compression fracture.Pain control with as neededmorphine and tramadol. On PRNalprazolam for anxiety.     DVT prophylaxis: SCD Code Status: Full Family Communication: Called husband got voicemail however voicemail was not set up  Status is: Inpatient  Remains inpatient appropriate because:Inpatient level of care appropriate due to severity of illness   Dispo: The patient is from: Home              Anticipated d/c is to: Home              Patient currently is not medically stable to d/c.   Difficult to place patient No            LOS: 15 days   Time spent: 35 minutes with more than 50% on [place], MD [facility] Pager [phone]  If 7PM-7AM, please contact night-coverage 02/27/2021, 8:33 AM

## 2021-02-27 NOTE — Progress Notes (Signed)
OT Cancellation Note  Patient Details Name: [name] [name] MRN: [id number] DOB: 1956/09/26   Cancelled Treatment:    Reason Eval/Treat Not Completed: Medical issues which prohibited therapy.  Patient with rapid response called during PT session.  OT will continue efforts as appropriate.    Richard D Popella 02/27/2021, 11:19 AM

## 2021-02-27 NOTE — Consult Note (Signed)
Palliative Medicine Inpatient Consult Note  Reason for consult:  Goals of Care  HPI:  Per intake H&P --> [name] 65 y.o. female with a PMH significant for type 2 diabetes, recent vertebral fracture, tobacco use, hyperlipidemia, hypothyroidism. 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 Acute hypoxemic respiratory failure.New systolic heart failure.  Palliative care has been asked to discuss goals of care.   Clinical Assessment/Goals of Care:  *Please note that this is a verbal dictation therefore any spelling or grammatical errors are due to the "[employer]" system interpretation.  For the purposes of this interview, I utilized [employer] Interpretation services for a [place] Interpretor Luanna Salk ID [phone]  I have reviewed medical records including EPIC notes, labs and imaging, received report from bedside RN, assessed the patient who is lying in bed coughing.    I met with [name] [name] to further discuss diagnosis prognosis, GOC, EOL wishes, disposition and options.   I introduced Palliative Medicine as specialized medical care for people living with serious illness. It focuses on providing relief from the symptoms and stress of a serious illness. The goal is to improve quality of life for both the patient and the family.  I asked [place] to tell me about herself. She shares that she is from [place] originally. She is an immigrant who traveled to the [place] > 20 years ago. She is married to her second husband, Nadara Mustard who she has been with for > 19 years. She has one daughter who lives in [place], [place] and two grandchildren. She is a former Civil engineer, contracting. She expresses that she enjoys life and living and likes to do "a little bit of everything." She is not a religious woman.  Prior to admission, [name] [name]  lived in a single family home with her husband in [place]. She was fully independent of all bADL's and iADL's.  A detailed discussion was had today regarding advanced directives - [name] [name] has never completed these though she would rely on her husband, Nadara Mustard to make decisions for her should she for any reason be incapacitated.    Concepts specific to code status, artifical feeding and hydration, continued IV antibiotics and rehospitalization was had. Reviewed that in the setting of weakness and hypoalbuminemia I would worry tremendously if we were to perform cardiopulmonary resuscitation that we would cause Thia much more harm than benefit. She shares with me that she disagrees and would want these measure performed.   Furthermore we reviewed that given the above she would be a very poorly surgical candidate and would likely not survive surgery. Again, she disagrees and tells me how "tough she is". I shared with her that I can tell she is a tough woman though I am very concerned that her current medical conditions are very likely to cause the end of her life. She however, does not feel this way.  Discussed the importance of continued conversation with family and their  medical providers regarding overall plan of care and treatment options, ensuring decisions are within the context of the patients values and GOCs. ______________________________________ I have made multiple attempts to contact patients husband, Nadara Mustard however his phone VM is not set up.   Decision Maker: Patient is alert and oriented. Her husband, howard would make decisions for her if she were unable to do so.  SUMMARY OF RECOMMENDATIONS   Full Code / Full Scope of Care -->  Encouraged consideration of DNR, patient is not at terms with her poor health condition presently  Appreciate Dr. Kipp Brood weighing in in terms of candidacy for surgery which is very poor  Appreciate Dietary weighing in to aid in nutrition  support  Appreciate PT/OT in the setting of generalized weakness  Patient adamant that she wants surgery and is not in a place where she is accepting that this will not be offered.   Plan to meet with patients husband when he comes in to bedside to share patients poor prognosis  Ongoing continued support   Code Status/Advance Care Planning: FULL CODE   Palliative Prophylaxis:   Oral Care, Mobility, Nutrition  Additional Recommendations (Limitations, Scope, Preferences):  Continue full scope of care  Psycho-social/Spiritual:   Desire for further Chaplaincy support: No, patient is not religious  Additional Recommendations: Continue to support patient and educate on severity of illness   Prognosis: Patient has an exceptionally poor prognosis.  Discharge Planning: Unclear  Oral Intake %:  Poor I/O:  (+) urination Bowel Movements:  (+) BM today Mobility: Limited  Vitals:   02/27/21 1130 02/27/21 1200  BP:  94/68  Pulse: 95 97  Resp:    Temp:    SpO2: 97% 95%    Intake/Output Summary (Last 24 hours) at 02/27/2021 1339 Last data filed at 02/27/2021 0500 Gross per 24 hour  Intake 240 ml  Output 300 ml  Net -60 ml   Last Weight  Most recent update: 02/27/2021  1:02 AM   Weight  44.1 kg (97 lb 3.6 oz)           Gen:  Frail chronically ill appearing woman HEENT: moist mucous membranes CV: Regular rate and rhythm  PULM: 4LPM [place] ABD: soft/nontender  EXT: No edema  Neuro: Alert and oriented x3   PPS: 30%   This conversation/these recommendations were discussed with patient primary care team, Dr. Kurtis Bushman  Time In: 1615 Time Out:  1725 Total Time: 70 Greater than 50%  of this time was spent counseling and coordinating care related to the above assessment and plan.  [place] Team Team Cell Phone: [phone] Please utilize secure chat with additional questions, if there is no response within 30 minutes please call the  above phone number  Palliative Medicine Team providers are available by phone from 7am to 7pm daily and can be reached through the team cell phone.  Should this patient require assistance outside of these hours, please call the patient's attending physician.

## 2021-02-27 NOTE — Progress Notes (Signed)
Morrie Sheldon RN called. She is a family friend and wanted to let staff know that her husband speaks russian  and was able to communicate with family if needed by staff. Information given to MD.  Morrie Sheldon RN: [phone] Merlin: [phone]

## 2021-02-27 NOTE — Progress Notes (Addendum)
1040: Staff assist alarm initiated by Physical Therapy during session with pt; Entered room to find pt on the BSC, appeared pale, cool to the touch and unresponsive to any questions from staff; staff assisted pt back to bed; VS taken; BP-64/42, HR-105, O2-94% on 4LNC, RR-50's,  BS- 155; Rapid Response RN called to pt room to assess; Rapid RN came to bedside and gave 250 mL NS Bolus; VS cycled; BP-90/70, HR-104, RR-35; Pt more responsive and able to answer questions when asked. Will continue monitor.

## 2021-02-27 NOTE — Progress Notes (Signed)
    [facility]   Date of Admission:  02/12/2021     Brief ID note: Follow up on pulmonary TB  Interval History: CT surgery recommendations noted with no surgical options and consideration of palliative consult.  Otherwise remains afebrile and O2 sats stable on nasal cannula.  Assessment:  Pulmonary TB  Recommendations: -- No changes today -- Continue RIPE therapy and vitamin B6 (started 02/20/21) -- Follow up with health department for continued TB treatment at discharge -- Please call as needed    Vedia Coffer for Infectious Disease [facility] [phone] pager 02/27/2021, 12:16 PM

## 2021-02-27 NOTE — Significant Event (Signed)
Rapid Response Event Note   Reason for Call :  Brief loss of consciousness while up to bedside commode  Initial Focused Assessment:  Pt now in bed. Skin is pale, warm, dry. No distress. Denies lightheadedness, dizziness, pain. Following commands. PERRLA, 7mm. Lung sounds are clear. Left lung diminished, chest tube suction can be heard on auscultation. Tachypneic, no distress. Pulses 1+ peripherally.   VS: BP 90/75, HR 104, RR 35, SpO2 95% on 4LNC  Interventions:  -250 cc bolus  Plan of Care:  -Continue to monitor mental status -Up with stand-by assistance - fall precautions -Increased frequency of VS  Call rapid response for additional needs  Event Summary:  MD Notified: Dr. Marylu Lund- MD at bedside to evaluate pt Call Time: 1033 Arrival Time: 1035 End Time: 1045  Jennye Moccasin, RN

## 2021-02-27 NOTE — Progress Notes (Addendum)
Physical Therapy Treatment Patient Details Name: [name] [name] MRN: [id number] DOB: 22-Jul-1956 Today's Date: 02/27/2021    History of Present Illness Pt is a 65 y.o. female admitted from PCP's office 02/12/21 with SOB. CXR with pneumothorax; s/p chest tube placement 3/3. Workup for TB vs. atypical infection vs. malignancy. S/p bronchoscopy with biopsy 3/7. 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 Per cardiothoracic sx, recurrent air leak likely not to resolve without complex sx; pt not surgical candidate due to malnutrition and debilitation. Of note, pt with recent fall; imaging revealed T12 compression fx. PMH includes DM2, tobacco use.   PT Comments    Pt with increased fatigue this session, initially requiring minA+2 to stand and take a few steps to [facility]; pt then able to stand with modA and perform pericare. After this second bout of standing, pt requriing return to sit and becoming unresponsive with eyes open but no verbalizations or command following despite max stimulation; totalA for return to bed with BP 60s/40s. SpO2 >/94% on O2 . Multiple RNs and rapid response present during session; pt becoming more responsive with return to supine. Based on pt's decline in functional mobility, now recommending SNF-level therapies to maximize functional mobility and independence prior to return home. Noted consult for Palliative Medicine.    Follow Up Recommendations  SNF;Supervision for mobility/OOB     Equipment Recommendations  Rolling walker with 5" wheels;3in1 (PT);Wheelchair (measurements PT);Wheelchair cushion (measurements PT)    Recommendations for Other Services  Palliative Medicine     Precautions / Restrictions Precautions Precautions: Fall;Other (comment) Precaution Comments: Chest tube to suction, can be water seal for mobility; airborne precautions for (+) TB; rapid response called during PT session 3/18 due to episode of unresponsiveness with significant  hypotension Restrictions Weight Bearing Restrictions: No    Mobility  Bed Mobility Overal bed mobility: Needs Assistance Bed Mobility: Supine to Sit;Sit to Supine     Supine to sit: Mod assist Sit to supine: Total assist;+2 for physical assistance   General bed mobility comments: ModA for trunk elevation; totalA for return to supine due to pt's unresponsiveness    Transfers Overall transfer level: Needs assistance Equipment used: 2 person hand held assist Transfers: Sit to/from [employer] Sit to Stand: Min assist;+2 physical assistance;+2 safety/equipment   Squat pivot transfers: Total assist;+2 physical assistance     General transfer comment: Initial stand from EOB with bilateral HHA and minA+2 for trunk elevation and stability; pt able to stand again from [facility] with modA for trunk elevation; pt becoming unresponsive requiring totalA for transfer from [facility] to bed  Ambulation/Gait Ambulation/Gait assistance: Min assist;+2 physical assistance Gait Distance (Feet): 4 Feet Assistive device: 2 person hand held assist Gait Pattern/deviations: Step-to pattern;Trunk flexed     General Gait Details: Slow, fatigued steps from bed to [facility] with minA for stability and bilateral HHA   Stairs             Wheelchair Mobility    Modified Rankin (Stroke Patients Only)       Balance Overall balance assessment: Needs assistance Sitting-balance support: No upper extremity supported Sitting balance-Leahy Scale: Fair Sitting balance - Comments: Able to sit EOB without UE support; poor balance in BSC with decreased responsiveness   Standing balance support: Bilateral upper extremity supported Standing balance-Leahy Scale: Poor                              Cognition Arousal/Alertness: Lethargic  Behavior During Therapy: Flat affect Overall Cognitive Status: Impaired/Different from baseline                                 General  Comments: Pt with minimal verbalizations; initially following verbal and gestural commands appropriately; becoming unresponsive with wide eyes and not following commands (rapid response called). At end of session, able to make needs known again      Exercises      General Comments General comments (skin integrity, edema, etc.): SBP 90s at beginning of session; pt becoming unresponsive on BSC requiring totalA+2 for return to bed with BP 60s/40s; HR 100s; SpO2 95% on O2 [place]. Multiple RNs and rapid response RN present at end of session to assist with pt      Pertinent Vitals/Pain Pain Assessment: Faces Faces Pain Scale: Hurts a little bit Pain Location: generalized with mobility - pt unable to specify Pain Descriptors / Indicators: Tiring Pain Intervention(s): Monitored during session    Home Living                      Prior Function            PT Goals (current goals can now be found in the care plan section) Acute Rehab PT Goals Patient Stated Goal: None stated PT Goal Formulation: With patient Time For Goal Achievement: 03/13/21 Potential to Achieve Goals: Fair Progress towards PT goals: Not progressing toward goals - comment (lack of participation; now with significant hypotension requiring rapid response call)    Frequency    Min 2X/week      PT Plan Discharge plan needs to be updated;Frequency needs to be updated    Co-evaluation              AM-PAC PT "6 Clicks" Mobility   Outcome Measure  Help needed turning from your back to your side while in a flat bed without using bedrails?: A Lot Help needed moving from lying on your back to sitting on the side of a flat bed without using bedrails?: A Lot Help needed moving to and from a bed to a chair (including a wheelchair)?: A Lot Help needed standing up from a chair using your arms (e.g., wheelchair or bedside chair)?: A Lot Help needed to walk in hospital room?: A Lot Help needed climbing 3-5 steps  with a railing? : A Lot 6 Click Score: 12    End of Session Equipment Utilized During Treatment: Oxygen Activity Tolerance: Treatment limited secondary to medical complications (Comment) Patient left: in bed;with call bell/phone within reach;with bed alarm set;with nursing/sitter in room Nurse Communication: Mobility status PT Visit Diagnosis: Other abnormalities of gait and mobility (R26.89);Difficulty in walking, not elsewhere classified (R26.2);Pain     Time: 1478-2956 PT Time Calculation (min) (ACUTE ONLY): 18 min  Charges:  $Therapeutic Activity: 8-22 mins                     Ina Homes, PT, DPT Acute Rehabilitation Services  Pager [phone] Office [phone]  Malachy Chamber 02/27/2021, 12:34 PM

## 2021-02-28 ENCOUNTER — Inpatient Hospital Stay (HOSPITAL_COMMUNITY): Payer: BLUE CROSS/BLUE SHIELD

## 2021-02-28 DIAGNOSIS — Z515 Encounter for palliative care: Secondary | ICD-10-CM | POA: Diagnosis not present

## 2021-02-28 DIAGNOSIS — A150 Tuberculosis of lung: Secondary | ICD-10-CM | POA: Diagnosis not present

## 2021-02-28 DIAGNOSIS — Z66 Do not resuscitate: Secondary | ICD-10-CM

## 2021-02-28 DIAGNOSIS — Z7189 Other specified counseling: Secondary | ICD-10-CM | POA: Diagnosis not present

## 2021-02-28 DIAGNOSIS — L8990 Pressure ulcer of unspecified site, unspecified stage: Secondary | ICD-10-CM | POA: Insufficient documentation

## 2021-02-28 LAB — GLUCOSE, CAPILLARY
Glucose-Capillary: 155 mg/dL — ABNORMAL HIGH (ref 70–99)
Glucose-Capillary: 71 mg/dL (ref 70–99)
Glucose-Capillary: 120 mg/dL — ABNORMAL HIGH (ref 70–99)
Glucose-Capillary: 107 mg/dL — ABNORMAL HIGH (ref 70–99)
Glucose-Capillary: 77 mg/dL (ref 70–99)

## 2021-02-28 NOTE — Care Management (Signed)
Went to bedside to speak with patient and spouse. [place] interpreter used. Offered home hospice choice. Chose [employer] [employer].  Raiford Noble, MSN, RN,BSN [facility] Case Manager [phone]

## 2021-02-28 NOTE — Progress Notes (Signed)
      [address]       [place] [address]             [phone]       12 Days Post-Op Procedure(s) (LRB): VIDEO BRONCHOSCOPY WITH FLUORO (Left) BRONCHIAL BIOPSIES BRONCHIAL BRUSHINGS BRONCHIAL WASHINGS  Subjective: Patient without specific complaint this am.  Objective: Vital signs in last 24 hours: Temp:  [98.6 F (37 C)-98.8 F (37.1 C)] 98.6 F (37 C) (03/19 0600) Pulse Rate:  [38-104] 78 (03/19 0600) Cardiac Rhythm: Normal sinus rhythm (03/19 0700) Resp:  [20-44] 28 (03/19 0600) BP: (84-108)/(63-92) 92/78 (03/19 0600) SpO2:  [89 %-99 %] 95 % (03/19 0600) Weight:  [44 kg] 44 kg (03/19 0620)   Intake/Output from previous day: 03/18 0701 - 03/19 0700 In: 780 [P.O.:780] Out: 550 [Urine:400; Chest Tube:150]   Physical Exam:  Cardiovascular: RRR Pulmonary: Coarse breath sounds L>R Abdomen: Soft, non tender, bowel sounds present. Extremities: No lower extremity edema. Wounds: Clean and dry.  No erythema or signs of infection. Chest Tube: to suction, with +++ persistent air leak  Lab Results: CBC:No results for input(s): WBC, HGB, HCT, PLT in the last 72 hours. BMET: No results for input(s): NA, K, CL, CO2, GLUCOSE, BUN, CREATININE, CALCIUM in the last 72 hours.  PT/INR: No results for input(s): LABPROT, INR in the last 72 hours. ABG:  INR: Will add last result for INR, ABG once components are confirmed Will add last 4 CBG results once components are confirmed  Assessment/Plan:  1. CV - SR 2.  Pulmonary - On 4 liters of oxygen via [place]. Chest tube is to suction and there has been a persistent air leak. Chest tube has had 150 cc of output last 12 hours. CXR this am not ordered so will order for am. Patient is NOT a surgical candidate. 3. History of hypothyroidism-continue Levothyroxine 50 mcg daily 4. ID-on for Isoniazid and Rifampin forTB 5. Appreciate Palliative Care's assistance  Lelon Huh ZimmermanPA-C 02/28/2021,8:55  AM [phone]

## 2021-02-28 NOTE — Plan of Care (Signed)
  Problem: Education: Goal: Knowledge of General Education information will improve Description: Including pain rating scale, medication(s)/side effects and non-pharmacologic comfort measures Outcome: Progressing   Problem: Health Behavior/Discharge Planning: Goal: Ability to manage health-related needs will improve Outcome: Progressing   Problem: Clinical Measurements: Goal: Will remain free from infection Outcome: Progressing Goal: Respiratory complications will improve Outcome: Progressing   Problem: Clinical Measurements: Goal: Ability to maintain clinical measurements within normal limits will improve Outcome: Not Progressing Goal: Diagnostic test results will improve Outcome: Not Progressing

## 2021-02-28 NOTE — Progress Notes (Signed)
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 Acute hypoxemic respiratory failure. New systolic heart failure.   65 year old female past medical history for type 2 diabetes mellitus, recent vertebral fracture, tobacco abuse, dyslipidemia and hypothyroidism. As an outpatient she was diagnosed with pneumothorax and referred to the hospital. In the ED she was found to have a 90% left tension hydropneumothorax with severe compression atelectasis of the lung medially and marked mediastinal shift. She had a left chest tube placed with good toleration. Her blood pressure was 146/81, heart rate 94, respiratory rate 23, oxygen saturation 98%. She had increased work of breathing, left-sided chest tube in place with decreased breath sounds bilaterally. Heart S1-S2, present rhythmic, soft abdomen, no lower extremity edema.  Sodium 133, potassium 4.3, chloride 97, bicarb 22, glucose 243, BUN 8, creatinine 0.50, white count 9.3, hemoglobin 12.4, hematocrit 38.8, platelets 578. SARS COVID-19 negative.  Chest radiograph with tension left-sided pneumothorax with mediastinal deviation to the right. EKG 118 bpm, normal axis, normal intervals, sinus rhythm, no ST segment T wave changes, positive LVH per EKG criteria.  Follow-up CT chest showed cavitary lesion. She was placed on broad-spectrum antibiotic therapy. Underwent bronchoscopy showing significant amount of purulent secretions on the left side.  She was placed on respiratory isolation, possible TB.  Not able to clamp chest tube due to recurrent pneumothorax.   Patient had worsening oxygen requirements, placed on heated high flow nasal cannula up to 30  L/min.  She continue to be very weak and deconditioned, microbiology confirmed pulmonary TB (2+AFB), and specific antibiotic was started.   Patient with hypotension, and worsening hypoxemia, back on heated high flow nasal cannula. Started on TB antibiotic regimen 02/20/21  Persistent pneumothorax, with positive air leak, not able to clamp chest tube.  Patient may need decortication per CT surgery, but her physical functional status is very low due to severe deconditioning.   She has a high respiratory rate, rapid shallow breathing, due to asvance deconditioning.   Improved oxygen requirements down to 4 L/min per .   3/17 pt not very hungry today. Offered protein shakes but declined 3/18- no complaints. Still with decrease po intake and appetite. Earlier this am had vasovagal event in the bathroom and RR was called.currently stable asx  3/19-family and pt decided on home with hospice   Consultants:   ID, PCCM, cardiothoracic, nutrition  Procedures:   Antimicrobials:       Subjective: No new complaints  Objective: Vitals:   02/28/21 0400 02/28/21 0600 02/28/21 0620 02/28/21 1200  BP: (!) 84/64 92/78  (!) 83/57  Pulse: (!) 38 78  98  Resp: 20 (!) 28  20  Temp:  98.6 F (37 C)  98.7 F (37.1 C)  TempSrc:  Axillary  Oral  SpO2: 99% 95%  96%  Weight:   44 kg   Height:        Intake/Output Summary (Last 24 hours) at 02/28/2021 1515 Last data filed at 02/28/2021 8119 Gross per 24 hour  Intake 360 ml  Output 550 ml  Net -190 ml   Filed Weights   02/26/21 0517 02/27/21 0100 02/28/21 0620  Weight: 42.6 kg 44.1 kg 44 kg    Examination: Calm, nad cta no w/r Regular s1/s2  Soft benign +bs No  edema aaoxo3  Data Reviewed: I have personally reviewed following labs and imaging studies  CBC: Recent Labs  Lab 02/22/21 0521 02/23/21 0352 02/24/21 0327 02/25/21 0453  WBC 11.4* 16.1* 11.7* 13.1*  NEUTROABS 9.4* 14.1* 9.9* 10.7*  HGB 12.2 11.6* 11.5*  12.3  HCT 36.0 34.7* 34.9* 37.2  MCV 77.8* 78.5* 79.1* 78.0*  PLT 433* 276 271 233   Basic Metabolic Panel: Recent Labs  Lab 02/22/21 0521 02/23/21 0352 02/24/21 0327 02/25/21 0453  NA 127* 132* 132* 134*  K 4.4 4.4 4.0 3.6  CL 93* 96* 97* 97*  CO2 24 29 31 30   GLUCOSE 359* 166* 108* 83  BUN 27* 24* 18 17  CREATININE 0.58 0.45 0.34* 0.42*  CALCIUM 8.5* 8.7* 8.5* 8.2*   GFR: Estimated Creatinine Clearance: 50.6 mL/min (A) (by C-G formula based on SCr of 0.42 mg/dL (L)). Liver Function Tests: Recent Labs  Lab 02/24/21 0327  AST 36  ALT 22  ALKPHOS 157*  BILITOT 0.8  PROT 5.1*  ALBUMIN 1.5*   No results for input(s): LIPASE, AMYLASE in the last 168 hours. No results for input(s): AMMONIA in the last 168 hours. Coagulation Profile: No results for input(s): INR, PROTIME in the last 168 hours. Cardiac Enzymes: No results for input(s): CKTOTAL, CKMB, CKMBINDEX, TROPONINI in the last 168 hours. BNP (last 3 results) No results for input(s): PROBNP in the last 8760 hours. HbA1C: No results for input(s): HGBA1C in the last 72 hours. CBG: Recent Labs  Lab 02/27/21 0517 02/27/21 1646 02/27/21 2046 02/28/21 0609 02/28/21 1218  GLUCAP 132* 162* 78 71 120*   Lipid Profile: No results for input(s): CHOL, HDL, LDLCALC, TRIG, CHOLHDL, LDLDIRECT in the last 72 hours. Thyroid Function Tests: No results for input(s): TSH, T4TOTAL, FREET4, T3FREE, THYROIDAB in the last 72 hours. Anemia Panel: No results for input(s): VITAMINB12, FOLATE, FERRITIN, TIBC, IRON, RETICCTPCT in the last 72 hours. Sepsis Labs: No results for input(s): PROCALCITON, LATICACIDVEN in the last 168 hours.  No results found for this or any previous visit (from the past 240 hour(s)).       Radiology Studies: DG CHEST PORT 1 VIEW  Result Date: 02/27/2021 CLINICAL DATA:  Pneumothorax EXAM: PORTABLE CHEST 1 VIEW COMPARISON:  Portable exam 0640 hours compared to 02/25/2021 FINDINGS: Pigtail thoracostomy  tube again identified, pigtail near the lateral costal margin. Again identified loculated pneumothorax at the lateral LEFT inferior hemithorax with significant atelectasis of the LEFT lung. Cavitary lesion in the LEFT upper lobe unchanged. Diffuse infiltrates RIGHT lung unchanged. Decrease in LEFT pleural effusion. No RIGHT pneumothorax or acute osseous findings. IMPRESSION: Persistent loculated LEFT basilar pneumothorax with extensive atelectasis of LEFT lung and persistent LEFT upper lobe cavitary lesion. Persistent diffuse RIGHT lung infiltrates. Electronically Signed   By: 02/27/2021 M.D.   On: 02/27/2021 08:21        Scheduled Meds: . atorvastatin  10 mg Oral QPM  . calcitonin (salmon)  1 spray Alternating Nares Daily  . cholecalciferol  1,000 Units Oral Daily  . ethambutol  800 mg Oral Daily  . insulin aspart  0-5 Units Subcutaneous QHS  . insulin aspart  0-9 Units Subcutaneous TID WC  . insulin glargine  10 Units Subcutaneous Daily  . isoniazid  300 mg Oral Daily  . levothyroxine  50 mcg Oral QAC breakfast  . lidocaine  1 patch Transdermal Q24H  . pyrazinamide  1,000 mg Oral Daily  . vitamin B-6  50 mg Oral Daily  . 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 Left tension spontaneous hydro-pneumothorax/NEW pulmonary tuberculosis, large cavitary lesion left upper lobe, complicated with superinfection with bacterial pneumonia. Severe sepsis (end-organ failure hypoxemia), present on admission. Acute hypoxemic respiratory failure. CTS and ID following Augmentin for superiposed bacterial infection, plan to stop after for  4 weeks, on 03/18/19 Continue B6 supplementation with paroxetine Recurrent PTX stable since yesterday. CT functioning. CTS following May conisder transfer to tertiary center. 3/18-CTS following patient is not a good surgical candidate due to her nutrition status and refusing therapies Palliative care was consulted Repeat chest x-ray appears to be stable Chest tube continues with large air leak CTS and ID following Continue ethambutol, INH, rifampin, pyrazinamide (started 02/20/21) F/u with health dept, for continued TB tx at discharge 3/19-since not a surgical candidate, plan to go home with hospice. Still with decreased p.o. intake   2. 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 Hypo-osmolar hyponatremia, not available urine electrolytes 3/19-stable      3. New acute systolic heart failure. EF 20 to 25%, severe decreased LV systolic function, LV with severe akinesis of the left ventricule, mid apical anteroseptal wall and inferior wall.  3/19 volume status stable, without exacerbation Initially received Lasix Hold cardiac meds due to hypotension     4. T2DMuncontrolled with hyperglycemia/ dyslpidemia.  BG stable  Continue R-ISS  Decreased p.o. intake monitor closely  Continue statins      5. HypothyroidContinuelevothyroxine   6. Moderate calorie protein malnutrition.Continue with nutritional supplements. Patient will need home health services at home at discharge.   7. Thoracic vertebral compression fracture.Pain control with as neededmorphine and tramadol. On PRNalprazolam for anxiety.     DVT prophylaxis: SCD Code Status: Full Family Communication: Called husband got voicemail however voicemail was not set up  Status is: Inpatient  Remains inpatient appropriate because:Inpatient level of care appropriate due to severity of illness   Dispo: The patient is from: Home              Anticipated d/c is to:  Home              Patient currently is not medically stable to d/c.   Difficult to place patient No            LOS: 16 days   Time spent: 35 minutes with more than 50% on COC    Lynn Ito, MD [facility] Pager [phone]  If 7PM-7AM, please contact night-coverage 02/28/2021, 3:15 PM

## 2021-02-28 NOTE — Progress Notes (Signed)
[facility])  Referral for hospice at home received from [facility] and PMT.  Met with pt and her spouse at the bedside. Pt advised to speak with her husband and did not want to discuss via interpreter.  The couple seem overwhelmed. Mr. Diebold states they have little help to in terms of family/friends that can help them. I am not sure they could manage a CT to suction at home, nor would this be safe considering the potential for dislodgement. Attempts to get to water seal likely need to occur.  DME discussed, they would need a hospital bed, O2, BSC, walker, WC, and shower chair.  [facility] can order this DME, but Mr. Sens has to clear space for this in his home and he is somewhat frail himself. He was going to reach out to an acquaintance to see if they can help him make room. They likely need additional resources at home (personal aides) for this to be a safe discharge plan.  [facility] will continue to follow and anticipate enrolling [name] [name] in hospice once above has been addressed.  Thank you, Venia Carbon RN, BSN, [facility] Liaison

## 2021-02-28 NOTE — Progress Notes (Signed)
Palliative Medicine Inpatient Follow Up Note  Reason for consult:  Goals of Care  HPI:  Per intake H&P --> [name] [name] 65 y.o.femalewith a PMH significant for type 2 diabetes, recent vertebral fracture, tobacco use, hyperlipidemia, hypothyroidism. 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 Acute hypoxemic respiratory failure.New systolic heart failure.  Palliative care has been asked to discuss goals of care.   Today's Discussion (02/28/2021):  *Please note that this is a verbal dictation therefore any spelling or grammatical errors are due to the "[place] [employer]" system interpretation.  Chart reviewed. I met with [name] [name] this morning. Utilized [employer] interpretation services [name] [name] ID [phone]. We reviewed that [name] [name] is not a surgical candidate. We again reviewed her medical history and poor physical condition at this time. Discussed the concerns that she would not survive the surgery. [name] [name] shares that she remains to want surgery. I told her that I understand but her case has been discussed with multiple surgeons who all agree that she is not a candidate for these advanced therapies. She is reasonably disappointed by this. We discussed the idea of her transferring to a different hospital though she states that she wants to stay here and receive treatment here. I shared with her that treatment options are quite limited in the absence of surgery.  I did broach the topic of hospice. She requested that I return later to share the above with her husband. ___________________________________________________________ Addendum:  AMN Interpretor, [name] [name] [phone]  I met with [name] [name] and her husband, [name] [name] at bedside. I again reviewed that [name] [name] has E9BM, systolic heart failure (EF 20-25%), hypothyroidism, and more actively TB  and spontaneous L Pneumothorax requiring chest tube insertion. We reviewed that she has tremendous muscular wasting and hypoalbuminemia. We discussed that as it is today she is doing very poorly and options such as surgery are not on the table. We discussed the reasons why this is and the risks associated with cardiothoracic surgeries.   We reviewed that options at this point are limited and I worry if we are hoping for complete recovery that this may not happen. I discussed hospice care which would be an alternative and provide the patient and her family the necessary aid to provide care. Patients husband, [name] [name] and [name] [name] agree that this would be a feasible option. I shared that I would ask the case management team to come by to further discuss hospice choice with them.  We went on to discuss code status. I again reviewed that if [name] [name] had a cardiac arrest or respiratory arrest that given her underlying medical conditions her outcomes would not be favorable. We discussed that the stress we may put on her body would be tremendous with little to no benefit. She and her husband both agreed to proceed with a DNAR/DNI order.  Questions and concerns addressed   Objective Assessment: Vital Signs Vitals:   02/28/21 0400 02/28/21 0600  BP: (!) 84/64 92/78  Pulse: (!) 38 78  Resp: 20 (!) 28  Temp:  98.6 F (37 C)  SpO2: 99% 95%    Intake/Output Summary (Last 24 hours) at 02/28/2021 1233 Last data filed at 02/28/2021 8413 Gross per 24 hour  Intake 660 ml  Output 550 ml  Net 110 ml   Last Weight  Most recent update: 02/28/2021  6:21 AM   Weight  44 kg (97 lb)  Gen:  Frail chronically ill appearing woman HEENT: moist mucous membranes CV: Regular rate and rhythm  PULM: 4LPM [place] ABD: soft/nontender  EXT: No edema  Neuro: Alert and oriented x3   SUMMARY OF RECOMMENDATIONS DNAR/DNI  Will Place Gold DNR on Chart  TOC - Appreciate consult for home hospice, family has selected  [facility]  Ongoing continued support  Time Spent: 60 minutes Greater than 50% of the time was spent in counseling and coordination of care ______________________________________________________________________________________ [place] Team Team Cell Phone: [phone] Please utilize secure chat with additional questions, if there is no response within 30 minutes please call the above phone number  Palliative Medicine Team providers are available by phone from 7am to 7pm daily and can be reached through the team cell phone.  Should this patient require assistance outside of these hours, please call the patient's attending physician.

## 2021-03-01 ENCOUNTER — Inpatient Hospital Stay (HOSPITAL_COMMUNITY): Payer: BLUE CROSS/BLUE SHIELD

## 2021-03-01 DIAGNOSIS — Z515 Encounter for palliative care: Secondary | ICD-10-CM | POA: Diagnosis not present

## 2021-03-01 DIAGNOSIS — Z66 Do not resuscitate: Secondary | ICD-10-CM | POA: Diagnosis not present

## 2021-03-01 DIAGNOSIS — Z7189 Other specified counseling: Secondary | ICD-10-CM | POA: Diagnosis not present

## 2021-03-01 DIAGNOSIS — A150 Tuberculosis of lung: Secondary | ICD-10-CM | POA: Diagnosis not present

## 2021-03-01 LAB — GLUCOSE, CAPILLARY
Glucose-Capillary: 92 mg/dL (ref 70–99)
Glucose-Capillary: 113 mg/dL — ABNORMAL HIGH (ref 70–99)
Glucose-Capillary: 111 mg/dL — ABNORMAL HIGH (ref 70–99)
Glucose-Capillary: 50 mg/dL — ABNORMAL LOW (ref 70–99)
Glucose-Capillary: 53 mg/dL — ABNORMAL LOW (ref 70–99)
Glucose-Capillary: 113 mg/dL — ABNORMAL HIGH (ref 70–99)
Glucose-Capillary: 136 mg/dL — ABNORMAL HIGH (ref 70–99)

## 2021-03-01 NOTE — Progress Notes (Signed)
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 Acute hypoxemic respiratory failure. New systolic heart failure.   65 year old female past medical history for type 2 diabetes mellitus, recent vertebral fracture, tobacco abuse, dyslipidemia and hypothyroidism. As an outpatient she was diagnosed with pneumothorax and referred to the hospital. In the ED she was found to have a 90% left tension hydropneumothorax with severe compression atelectasis of the lung medially and marked mediastinal shift. She had a left chest tube placed with good toleration. Her blood pressure was 146/81, heart rate 94, respiratory rate 23, oxygen saturation 98%. She had increased work of breathing, left-sided chest tube in place with decreased breath sounds bilaterally. Heart S1-S2, present rhythmic, soft abdomen, no lower extremity edema.  Sodium 133, potassium 4.3, chloride 97, bicarb 22, glucose 243, BUN 8, creatinine 0.50, white count 9.3, hemoglobin 12.4, hematocrit 38.8, platelets 578. SARS COVID-19 negative.  Chest radiograph with tension left-sided pneumothorax with mediastinal deviation to the right. EKG 118 bpm, normal axis, normal intervals, sinus rhythm, no ST segment T wave changes, positive LVH per EKG criteria.  Follow-up CT chest showed cavitary lesion. She was placed on broad-spectrum antibiotic therapy. Underwent bronchoscopy showing significant amount of purulent secretions on the left side.  She was placed on respiratory isolation, possible TB.  Not able to clamp chest tube due to recurrent pneumothorax.   Patient had worsening oxygen requirements, placed on heated high flow nasal cannula up to 30  L/min.  She continue to be very weak and deconditioned, microbiology confirmed pulmonary TB (2+AFB), and specific antibiotic was started.   Patient with hypotension, and worsening hypoxemia, back on heated high flow nasal cannula. Started on TB antibiotic regimen 02/20/21  Persistent pneumothorax, with positive air leak, not able to clamp chest tube.  Patient may need decortication per CT surgery, but her physical functional status is very low due to severe deconditioning.   She has a high respiratory rate, rapid shallow breathing, due to asvance deconditioning.   Improved oxygen requirements down to 4 L/min per [place].   3/17 pt not very hungry today. Offered protein shakes but declined 3/18- no complaints. Still with decrease po intake and appetite. Earlier this am had vasovagal event in the bathroom and RR was called.currently stable asx  3/19-family and pt decided on home with hospice  3/20-has no appetite, decrease po intake  Consultants:   ID, PCCM, cardiothoracic, nutrition  Procedures:   Antimicrobials:       Subjective: No complaints. Denies sob, dizziness. Lying in bed, quiet.  Objective: Vitals:   03/01/21 0800 03/01/21 0801 03/01/21 0900 03/01/21 1100  BP: 91/64 91/64 92/67  92/64  Pulse: 100 100 98 94  Resp: (!) 28 (!) 30    Temp:  98 F (36.7 C)    TempSrc:  Oral    SpO2: 94% 94% 96%   Weight:      Height:        Intake/Output Summary (Last 24 hours) at 03/01/2021 1338 Last data filed at 03/01/2021 1015 Gross per 24 hour  Intake 240 ml  Output 90 ml  Net 150 ml   Filed Weights   02/27/21 0100 02/28/21 0620 03/01/21 0100  Weight: 44.1 kg 44 kg 43.5 kg    Examination: Appears tired, nad cta no w/r  rrr s1/s2 Benign +bs No edema  Data Reviewed: I have personally reviewed following labs and imaging studies  CBC: Recent Labs  Lab 02/23/21 0352 02/24/21 0327 02/25/21 0453  WBC 16.1* 11.7* 13.1*  NEUTROABS 14.1* 9.9* 10.7*  HGB 11.6*  11.5* 12.3  HCT 34.7* 34.9* 37.2  MCV 78.5* 79.1* 78.0*  PLT 276 271 233   Basic Metabolic Panel: Recent Labs  Lab 02/23/21 0352 02/24/21 0327 02/25/21 0453  NA 132* 132* 134*  K 4.4 4.0 3.6  CL 96* 97* 97*  CO2 29 31 30   GLUCOSE 166* 108* 83  BUN 24* 18 17  CREATININE 0.45 0.34* 0.42*  CALCIUM 8.7* 8.5* 8.2*   GFR: Estimated Creatinine Clearance: 50.1 mL/min (A) (by C-G formula based on SCr of 0.42 mg/dL (L)). Liver Function Tests: Recent Labs  Lab 02/24/21 0327  AST 36  ALT 22  ALKPHOS 157*  BILITOT 0.8  PROT 5.1*  ALBUMIN 1.5*   No results for input(s): LIPASE, AMYLASE in the last 168 hours. No results for input(s): AMMONIA in the last 168 hours. Coagulation Profile: No results for input(s): INR, PROTIME in the last 168 hours. Cardiac Enzymes: No results for input(s): CKTOTAL, CKMB, CKMBINDEX, TROPONINI in the last 168 hours. BNP (last 3 results) No results for input(s): PROBNP in the last 8760 hours. HbA1C: No results for input(s): HGBA1C in the last 72 hours. CBG: Recent Labs  Lab 02/28/21 1218 02/28/21 1739 02/28/21 2055 03/01/21 0107 03/01/21 0604  GLUCAP 120* 107* 77 92 113*   Lipid Profile: No results for input(s): CHOL, HDL, LDLCALC, TRIG, CHOLHDL, LDLDIRECT in the last 72 hours. Thyroid Function Tests: No results for input(s): TSH, T4TOTAL, FREET4, T3FREE, THYROIDAB in the last 72 hours. Anemia Panel: No results for input(s): VITAMINB12, FOLATE, FERRITIN, TIBC, IRON, RETICCTPCT in the last 72 hours. Sepsis Labs: No results for input(s): PROCALCITON, LATICACIDVEN in the last 168 hours.  No results found for this or any previous visit (from the past 240 hour(s)).       Radiology Studies: DG CHEST PORT 1 VIEW  Result Date: 03/01/2021 CLINICAL DATA:  Evaluate pneumothorax EXAM: PORTABLE CHEST 1 VIEW COMPARISON:  February 28, 2021 FINDINGS: Mediastinal contour and cardiac silhouette are stable. Left chest tube is unchanged. Left pneumothorax  is unchanged compared prior exam. Collapse of the left lung is unchanged. Diffuse right pulmonary nodularity is unchanged. The mediastinal contour and cardiac silhouette are stable. IMPRESSION: Left pneumothorax is unchanged compared prior exam. Left chest tube is unchanged. Electronically Signed   By: Sherian ReinWei-Chen  Lin M.D.   On: 03/01/2021 09:19   DG Chest Port 1 View  Result Date: 02/28/2021 CLINICAL DATA:  65 year old female with acute respiratory distress. Pneumothorax. EXAM: PORTABLE CHEST 1 VIEW COMPARISON:  Chest radiograph dated 02/27/2021. FINDINGS: Left-sided chest tube in similar position. Persistent large left pneumothorax increased since the study of 02/27/2021 but similar to 02/25/2021. There is collapse of the majority of the left lung. Diffuse right lung nodular infiltrates similar to prior radiograph. No pleural effusion. Is stable cardiomediastinal silhouette. No acute osseous pathology. IMPRESSION: 1. Persistent large left pneumothorax with collapse of the majority of the left lung. Left-sided chest tube in similar position. 2. Diffuse right lung infiltrate. Electronically Signed   By: Elgie CollardArash  Radparvar M.D.   On: 02/28/2021 20:27        Scheduled Meds: . atorvastatin  10 mg Oral QPM  . calcitonin (salmon)  1 spray Alternating Nares Daily  . cholecalciferol  1,000 Units Oral Daily  .  ethambutol  800 mg Oral Daily  . insulin aspart  0-5 Units Subcutaneous QHS  . insulin aspart  0-9 Units Subcutaneous TID WC  . insulin glargine  10 Units Subcutaneous Daily  . isoniazid  300 mg Oral Daily  . levothyroxine  50 mcg Oral QAC breakfast  . lidocaine  1 patch Transdermal Q24H  . pyrazinamide  1,000 mg Oral Daily  . vitamin B-6  50 mg Oral Daily  . 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 Left tension spontaneous hydro-pneumothorax/NEW pulmonary tuberculosis, large cavitary lesion left upper lobe, complicated with superinfection with bacterial pneumonia. Severe sepsis (end-organ failure hypoxemia), present on admission. Acute hypoxemic respiratory failure. CTS and ID following Augmentin for superiposed bacterial infection, plan to stop after for 4 weeks, on 03/18/19 Continue B6 supplementation with paroxetine Recurrent PTX stable since yesterday. CT functioning. CTS following May conisder transfer to tertiary center. 3/18-CTS following patient is not a good surgical candidate due to her nutrition status and refusing therapies Palliative care was consulted Repeat chest x-ray appears to be stable Chest tube continues with large air leak CTS and ID following Continue ethambutol, INH, rifampin, pyrazinamide (started 02/20/21) F/u with health dept, for continued TB tx at discharge 3/20-since not a surgical candidate, plan to go home with hospice care  Trying to set up home health for patient Willing to discuss with CT surgery) on discharge   2. 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 Hypo-osmolar hyponatremia, not available urine electrolytes 3/20-stable  encouraged to increase p.o. intake      3. New acute systolic heart failure. EF 20 to 25%, severe decreased LV systolic function, LV with severe akinesis of the left ventricule, mid apical anteroseptal wall and inferior wall.  3/20 -volume status stable, without exacerbation  Initially received Lasix  Holding cardiac meds due to hypotension       4. T2DMuncontrolled with hyperglycemia/ dyslpidemia.  BG stable  Continue ISS increase she increases her p.o. intake  Continue  statins       5. HypothyroidContinuelevothyroxine   6. Moderate calorie protein malnutrition.Continue with nutritional supplements. Patient will need home health services at home at discharge.   7. Thoracic vertebral compression fracture.Pain control with as neededmorphine and tramadol. On PRNalprazolam for anxiety.     DVT prophylaxis: SCD Code Status: Full Family Communication: None at bedside Status is: Inpatient  Remains inpatient appropriate because:Inpatient level of care appropriate due to severity of illness   Dispo: The patient is from: Home              Anticipated d/c is to: Home              Patient currently is not medically stable to d/c.   Difficult to place patient No  Pt plans to go home with hospice. HH needs to be setup. Also has chest tube currently .          LOS: 17 days   Time spent: 35 minutes with more than 50% on COC    Lynn Ito, MD [facility]  Pager [phone]  If 7PM-7AM, please contact night-coverage 03/01/2021, 1:38 PM

## 2021-03-01 NOTE — Progress Notes (Signed)
Palliative Medicine Inpatient Follow Up Note  Reason for consult:  Goals of Care  HPI:  Per intake H&P --> [name] [name] 65 y.o.femalewith a PMH significant for type 2 diabetes, recent vertebral fracture, tobacco use, hyperlipidemia, hypothyroidism. 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 Acute hypoxemic respiratory failure.New systolic heart failure.  Palliative care has been asked to discuss goals of care.   Today's Discussion (03/01/2021):  *Please note that this is a verbal dictation therefore any spelling or grammatical errors are due to the "[place] [employer]" system interpretation.  I met with [name] [name] this afternoon. We communicated through the help of AMN interpretor service - Alinda Sierras [phone]. I shared with [name] [name] my concerns about her going home. We discussed that she is very sick and I worry that she will have many care needs that her husband, [name] [name] cannot solely address. We discussed that it is "just them". Though there are some friends who they have nearby though no formal family members. Patients daughter lives in [place] and is not planning on coming to the [place]. We reviewed at this point if [name] [name] would like for [place] to pursue comfort measures. We discussed stopping monitoring, IVS, and needle sticks. She shares that her goal is to "go home". She wants to remain as well as possible to get home. We again reviewed hospice and the values of hospice which are to preserve dignity at the end of patients lives. We discussed that she sadly has a very poor prognosis and will likely not be with [place] for very long.  She looked at me for a period of time before accepting this verbally.   I was able to call patients husband, [name] [name]. Per conversations with [name] [name] he is nervous about her coming home. He shares that he  realizes she will not be on earth for very much longer. He would like to respect her wishes to get home. While on the phone he shared with me that he had multiple [place] friends and his neighbor who were helping to move all of the furniture out to allow DME delivery. [name] [name] expresses that he does have support if needed, most notably through a neighbor. He does feel they would be reliable if needed. We discussed the difficulties he will encounter once [name] [name] gets home. He expresses that he wants to "cook for [name] [name] again". He also shares that they met during an [place] trip. I offer support through therapeutic listening.  I was able thereafter to touch base with [name] [name]'s care team. As of right now DME still need to be delivered and [facility] needs to verify they can accommodate a chest tube to suction. If all of this is verified then [name] [name] can discharge home barring no acute events.   Questions and concerns addressed   Objective Assessment: Vital Signs Vitals:   03/01/21 0900 03/01/21 1100  BP: 92/67 92/64  Pulse: 98 94  Resp:    Temp:    SpO2: 96%     Intake/Output Summary (Last 24 hours) at 03/01/2021 1540 Last data filed at 03/01/2021 1015 Gross per 24 hour  Intake 240 ml  Output 90 ml  Net 150 ml   Last Weight  Most recent update: 03/01/2021  1:11 AM   Weight  43.5 kg (95 lb 14.4 oz)           Gen:  Frail chronically ill appearing woman HEENT: moist  mucous membranes CV: Regular rate and rhythm  PULM: 5LPM [place] ABD: soft/nontender  EXT: No edema  Neuro: Alert and oriented x3   SUMMARY OF RECOMMENDATIONS DNAR/DNI  Plan for discharge home with [facility] - Need to verify they can accommodate a chest tube to suction as CTS does not have an alterative option to date  Social support is present through patients neighbors and [place]  PMT will continue to check in incrementally  Time Spent: 60 minutes Greater than 50% of the time was spent in counseling  and coordination of care ______________________________________________________________________________________ [place] Team Team Cell Phone: [phone] Please utilize secure chat with additional questions, if there is no response within 30 minutes please call the above phone number  Palliative Medicine Team providers are available by phone from 7am to 7pm daily and can be reached through the team cell phone.  Should this patient require assistance outside of these hours, please call the patient's attending physician.

## 2021-03-01 NOTE — Progress Notes (Addendum)
      [address]       [place] [address]             [phone]       13 Days Post-Op Procedure(s) (LRB): VIDEO BRONCHOSCOPY WITH FLUORO (Left) BRONCHIAL BIOPSIES BRONCHIAL BRUSHINGS BRONCHIAL WASHINGS  Subjective: Patient when asked if breathing ok, nodded yes  Objective: Vital signs in last 24 hours: Temp:  [97.6 F (36.4 C)-98.7 F (37.1 C)] 97.6 F (36.4 C) (03/20 0000) Pulse Rate:  [85-112] 98 (03/20 0500) Cardiac Rhythm: Sinus tachycardia (03/19 1908) Resp:  [20-41] 34 (03/20 0500) BP: (82-111)/(51-75) 95/69 (03/20 0500) SpO2:  [89 %-96 %] 92 % (03/20 0500) Weight:  [43.5 kg] 43.5 kg (03/20 0100)   Intake/Output from previous day: 03/19 0701 - 03/20 0700 In: -  Out: 90 [Chest Tube:90]   Physical Exam:  Cardiovascular: RRR Pulmonary: Diminished breath sounds on left, especially at left base Abdomen: Soft, non tender, bowel sounds present. Extremities: No lower extremity edema. Wounds: Clean and dry.  No erythema or signs of infection. Chest Tube: to suction, with +++ persistent air leak  Lab Results: CBC:No results for input(s): WBC, HGB, HCT, PLT in the last 72 hours. BMET: No results for input(s): NA, K, CL, CO2, GLUCOSE, BUN, CREATININE, CALCIUM in the last 72 hours.  PT/INR: No results for input(s): LABPROT, INR in the last 72 hours. ABG:  INR: Will add last result for INR, ABG once components are confirmed Will add last 4 CBG results once components are confirmed  Assessment/Plan:  1. CV - SR 2.  Pulmonary - On 5 liters of oxygen via [name]. Has had tachypnea. Chest tube is to suction and there has been a persistent air leak. Chest tube has had 90 cc of output last 12 hours. CXR this am appears relatively stable (large left pneumothorax, diffuse right lung infiltrate). Patient is NOT a surgical candidate. 3. History of hypothyroidism-continue Levothyroxine 50 mcg daily 4. ID-on for Isoniazid and Rifampin forTB 5. Appreciate  Palliative Care's assistance. Patient now a DNR  [name] Huh ZimmermanPA-C 03/01/2021,7:51 AM [phone]  Will attempt water seal today for dispo planning Will check cxr in 2 hrs  [name] [name]

## 2021-03-02 LAB — GLUCOSE, CAPILLARY
Glucose-Capillary: 117 mg/dL — ABNORMAL HIGH (ref 70–99)
Glucose-Capillary: 177 mg/dL — ABNORMAL HIGH (ref 70–99)
Glucose-Capillary: 100 mg/dL — ABNORMAL HIGH (ref 70–99)
Glucose-Capillary: 130 mg/dL — ABNORMAL HIGH (ref 70–99)

## 2021-03-02 LAB — QUANTIFERON-TB GOLD PLUS: QuantiFERON-TB Gold Plus: NEGATIVE

## 2021-03-02 LAB — QUANTIFERON-TB GOLD PLUS (RQFGPL)
QuantiFERON Mitogen Value: 1.61 IU/mL
QuantiFERON Nil Value: 0.15 IU/mL
QuantiFERON TB1 Ag Value: 0.28 IU/mL
QuantiFERON TB2 Ag Value: 0.32 IU/mL

## 2021-03-02 NOTE — Progress Notes (Signed)
Patient ID: [name] [name], female   DOB: 1956/08/08, 65 y.o.   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 Acute hypoxemic respiratory failure. New systolic heart failure.   65 year old female past medical history for type 2 diabetes mellitus, recent vertebral fracture, tobacco abuse, dyslipidemia and hypothyroidism. As an outpatient she was diagnosed with pneumothorax and referred to the hospital. In the ED she was found to have a 90% left tension hydropneumothorax with severe compression atelectasis of the lung medially and marked mediastinal shift. She had a left chest tube placed with good toleration. Her blood pressure was 146/81, heart rate 94, respiratory rate 23, oxygen saturation 98%. She had increased work of breathing, left-sided chest tube in place with decreased breath sounds bilaterally. Heart S1-S2, present rhythmic, soft abdomen, no lower extremity edema.  Sodium 133, potassium 4.3, chloride 97, bicarb 22, glucose 243, BUN 8, creatinine 0.50, white count 9.3, hemoglobin 12.4, hematocrit 38.8, platelets 578. SARS COVID-19 negative.  Chest radiograph with tension left-sided pneumothorax with mediastinal deviation to the right. EKG 118 bpm, normal axis, normal intervals, sinus rhythm, no ST segment T wave changes, positive LVH per EKG criteria.  Follow-up CT chest showed cavitary lesion. She was placed on broad-spectrum antibiotic therapy. Underwent bronchoscopy showing significant amount of purulent secretions on the left side.  She was placed on respiratory isolation, possible TB.  Not able to clamp chest tube due to recurrent pneumothorax.   Patient had worsening  oxygen requirements, placed on heated high flow nasal cannula up to 30 L/min.  She continue to be very weak and deconditioned, microbiology confirmed pulmonary TB (2+AFB), and specific antibiotic was started.   Patient with hypotension, and worsening hypoxemia, back on heated high flow nasal cannula. Started on TB antibiotic regimen 02/20/21  Persistent pneumothorax, with positive air leak, not able to clamp chest tube.  Patient may need decortication per CT surgery, but her physical functional status is very low due to severe deconditioning.   She has a high respiratory rate, rapid shallow breathing, due to asvance deconditioning.   Improved oxygen requirements down to 4 L/min per [facility].   3/17 pt not very hungry today. Offered protein shakes but declined 3/18- no complaints. Still with decrease po intake and appetite. Earlier this am had vasovagal event in the bathroom and RR was called.currently stable asx  3/19-family and pt decided on home with hospice  3/20-has no appetite, decrease po intake 3/22- no overnight issues. Spoke to CTS Dr. Cliffton Asters about pt going home with CT. Pt cannot go home with CT on suction, he will place orders for waterseal and get cxr see if pt tolerates. If she tolerates,she can go home with CT on waterseal. If she doesn't , she cannot go with CT on suction. If we take the CT out, high chance of lungs collapsing quickly. Went to discuss this with husband, husband had already left the room.    Consultants:   ID, PCCM, cardiothoracic, nutrition  Procedures:   Antimicrobials:       Subjective: Lying in bed, not much po intake. No complaints.  Objective: Vitals:   03/02/21 0900 03/02/21 1000 03/02/21 1100 03/02/21 1600  BP: 100/65 95/62 114/73 109/76  Pulse: 88 86 97 92  Resp: (!) 21 20  20 20  Temp:   98.5 F (36.9 C) 98.6 F (37 C)  TempSrc:   Oral Oral  SpO2:   91% 95%  Weight:      Height:        Intake/Output Summary (Last 24  hours) at 03/02/2021 1636 Last data filed at 03/02/2021 1042 Gross per 24 hour  Intake 220 ml  Output 840 ml  Net -620 ml   Filed Weights   02/28/21 0620 03/01/21 0100 03/02/21 0600  Weight: 44 kg 43.5 kg 43.7 kg    Examination: Quite, nad CT inplace. cta anteriorly Regular s1/s2 Soft benign +bs No edema  Data Reviewed: I have personally reviewed following labs and imaging studies  CBC: Recent Labs  Lab 02/24/21 0327 02/25/21 0453  WBC 11.7* 13.1*  NEUTROABS 9.9* 10.7*  HGB 11.5* 12.3  HCT 34.9* 37.2  MCV 79.1* 78.0*  PLT 271 233   Basic Metabolic Panel: Recent Labs  Lab 02/24/21 0327 02/25/21 0453  NA 132* 134*  K 4.0 3.6  CL 97* 97*  CO2 31 30  GLUCOSE 108* 83  BUN 18 17  CREATININE 0.34* 0.42*  CALCIUM 8.5* 8.2*   GFR: Estimated Creatinine Clearance: 50.3 mL/min (A) (by C-G formula based on SCr of 0.42 mg/dL (L)). Liver Function Tests: Recent Labs  Lab 02/24/21 0327  AST 36  ALT 22  ALKPHOS 157*  BILITOT 0.8  PROT 5.1*  ALBUMIN 1.5*   No results for input(s): LIPASE, AMYLASE in the last 168 hours. No results for input(s): AMMONIA in the last 168 hours. Coagulation Profile: No results for input(s): INR, PROTIME in the last 168 hours. Cardiac Enzymes: No results for input(s): CKTOTAL, CKMB, CKMBINDEX, TROPONINI in the last 168 hours. BNP (last 3 results) No results for input(s): PROBNP in the last 8760 hours. HbA1C: No results for input(s): HGBA1C in the last 72 hours. CBG: Recent Labs  Lab 03/01/21 1546 03/01/21 1732 03/01/21 2131 03/02/21 0615 03/02/21 1147  GLUCAP 50* 113* 136* 117* 177*   Lipid Profile: No results for input(s): CHOL, HDL, LDLCALC, TRIG, CHOLHDL, LDLDIRECT in the last 72 hours. Thyroid Function Tests: No results for input(s): TSH, T4TOTAL, FREET4, T3FREE, THYROIDAB in the last 72 hours. Anemia Panel: No results for input(s): VITAMINB12, FOLATE, FERRITIN, TIBC, IRON, RETICCTPCT in the last 72 hours. Sepsis  Labs: No results for input(s): PROCALCITON, LATICACIDVEN in the last 168 hours.  No results found for this or any previous visit (from the past 240 hour(s)).       Radiology Studies: DG CHEST PORT 1 VIEW  Result Date: 03/01/2021 CLINICAL DATA:  Evaluate pneumothorax EXAM: PORTABLE CHEST 1 VIEW COMPARISON:  February 28, 2021 FINDINGS: Mediastinal contour and cardiac silhouette are stable. Left chest tube is unchanged. Left pneumothorax is unchanged compared prior exam. Collapse of the left lung is unchanged. Diffuse right pulmonary nodularity is unchanged. The mediastinal contour and cardiac silhouette are stable. IMPRESSION: Left pneumothorax is unchanged compared prior exam. Left chest tube is unchanged. Electronically Signed   By: Sherian Rein M.D.   On: 03/01/2021 09:19   DG Chest Port 1 View  Result Date: 02/28/2021 CLINICAL DATA:  65 year old female with acute respiratory distress. Pneumothorax. EXAM: PORTABLE CHEST 1 VIEW COMPARISON:  Chest radiograph dated 02/27/2021. FINDINGS: Left-sided chest tube in similar position. Persistent large left pneumothorax increased since the study of 02/27/2021 but similar to 02/25/2021. There is collapse of the majority of the left lung. Diffuse right lung nodular infiltrates similar to prior radiograph. No pleural  effusion. Is stable cardiomediastinal silhouette. No acute osseous pathology. IMPRESSION: 1. Persistent large left pneumothorax with collapse of the majority of the left lung. Left-sided chest tube in similar position. 2. Diffuse right lung infiltrate. Electronically Signed   By: Elgie CollardArash  Radparvar M.D.   On: 02/28/2021 20:27        Scheduled Meds:  atorvastatin  10 mg Oral QPM   calcitonin (salmon)  1 spray Alternating Nares Daily   cholecalciferol  1,000 Units Oral Daily   ethambutol  800 mg Oral Daily   insulin aspart  0-5 Units Subcutaneous QHS   insulin aspart  0-9 Units Subcutaneous TID WC   insulin glargine  10 Units  Subcutaneous Daily   isoniazid  300 mg Oral Daily   levothyroxine  50 mcg Oral QAC breakfast   lidocaine  1 patch Transdermal Q24H   pyrazinamide  1,000 mg Oral Daily   vitamin B-6  50 mg Oral Daily   rifampin  600 mg Oral Daily   Continuous Infusions:  Assessment & Plan:   Principal Problem:   Pulmonary TB Active Problems:   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 Left tension spontaneous hydro-pneumothorax/NEW pulmonary tuberculosis, large cavitary lesion left upper lobe, complicated with superinfection with bacterial pneumonia. Severe sepsis (end-organ failure hypoxemia), present on admission. Acute hypoxemic respiratory failure. CTS and ID following Augmentin for superiposed bacterial infection, plan to stop after for 4 weeks, on 03/18/19 Continue B6 supplementation with paroxetine Recurrent PTX stable since yesterday. CT functioning. CTS following May conisder transfer to tertiary center. 3/18-CTS following patient is not a good surgical candidate due to her nutrition status and refusing therapies Palliative care was consulted Repeat chest x-ray appears to be stable Chest tube continues with large air leak CTS and ID following Continue ethambutol, INH, rifampin, pyrazinamide (started 02/20/21) F/u with health dept, for continued TB tx at discharge 3/20-since not a surgical candidate, plan to go home with hospice care  3/21-cannot go home with chest tube on suction, will try placing chest tube to waterseal with repeat chest x-ray to see if she tolerates.  She can go home only with chest tube on waterseal per CTS  2. 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 Hypo-osmolar  hyponatremia, not available urine electrolytes Stable. Encouraged po intake      3. New acute systolic heart failure. EF 20 to 25%, severe decreased LV systolic function, LV with severe akinesis of the left ventricule, mid apical anteroseptal wall and inferior wall.  3/21-stable without exacerbation  Initially received Lasix  Holding cardiac meds due to hypotension       4. T2DMuncontrolled with hyperglycemia/ dyslpidemia.  BG stable Continue statins Continue R-ISS as neede       5. HypothyroidContinuelevothyroxine   6. Moderate calorie protein malnutrition.Continue with nutritional supplements. Patient will need home health services at home at discharge.   7. Thoracic vertebral compression fracture.Pain control with as neededmorphine and tramadol. On PRNalprazolam for anxiety.     DVT prophylaxis: SCD Code Status: Full Family Communication: None at bedside Status is: Inpatient  Remains inpatient appropriate because:Inpatient level of care appropriate due to severity of illness   Dispo: The patient is from: Home  Anticipated d/c is to: Home              Patient currently is not medically stable to d/c.   Difficult to place patient No  Pt plans to go home with hospice. HH needs to be setup. Also has chest tube on suction.          LOS: 18 days   Time spent: 35 minutes with more than 50% on COC    Lynn Ito, MD [facility] Pager [phone]  If 7PM-7AM, please contact night-coverage 03/02/2021, 4:36 PM

## 2021-03-02 NOTE — TOC Progression Note (Signed)
Transition of Care [facility]) - Progression Note    Patient Details  Name: [name] [name] MRN: [id number] Date of Birth: 1956-02-10  [facility] CM/SW Contact  Bess Kinds, RN Phone Number: [phone] 03/02/2021, 9:48 AM  Clinical Narrative:     Received call from Lara Mulch, RN at [facility] TB program [phone]). Requested quantiferon results from 3//2022 - faxed to [phone] to the attention of Lara Mulch, RN and Dr. Starr Sinclair. Tammy will need to be updated of patient discharge.   Expected Discharge Plan: Skilled Nursing Facility Barriers to Discharge: Continued Medical Work up  Expected Discharge Plan and Services Expected Discharge Plan: Skilled Nursing Facility     Post Acute Care Choice: Skilled Nursing Facility Living arrangements for the past 2 months: Mobile Home                                       Social Determinants of Health (SDOH) Interventions    Readmission Risk Interventions No flowsheet data found.

## 2021-03-02 NOTE — Progress Notes (Signed)
Inpatient Diabetes Program Recommendations  AACE/ADA: New Consensus Statement on Inpatient Glycemic Control   Target Ranges:  Prepandial:   less than 140 mg/dL      Peak postprandial:   less than 180 mg/dL (1-2 hours)      Critically ill patients:  140 - 180 mg/dL  Results for KYAN, GIANNONE (MRN [id number]) as of 03/02/2021 09:27  Ref. Range 03/01/2021 06:04 03/01/2021 11:58 03/01/2021 15:43 03/01/2021 15:46 03/01/2021 17:32 03/01/2021 21:31 03/02/2021 06:15  Glucose-Capillary Latest Ref Range: 70 - 99 mg/dL 416 (H) 606 (H) 53 (L) 50 (L) 113 (H) 136 (H) 117 (H)    Review of Glycemic Control  Diabetes history: DM2 Outpatient Diabetes medications: Amaryl 2 mg QAM Current orders for Inpatient glycemic control: Lantus 10 units daily, Novolog 0-9 units TID with meals, Novolog 0-5 units QHS  Inpatient Diabetes Program Recommendations:    Insulin: Please consider decreasing Lantus to 5 units daily (to start 03/03/21 as patient has already received Lantus 10 units today).  Thanks, Orlando Penner, RN, MSN, CDE Diabetes Coordinator Inpatient Diabetes Program [phone] (Team Pager from 8am to 5pm)

## 2021-03-02 NOTE — TOC Progression Note (Signed)
Transition of Care [facility]) - Progression Note    Patient Details  Name: Aubriella Perezgarcia MRN: [id number] Date of Birth: 1956/12/01  Transition [facility] CM/SW Contact  Bess Kinds, RN Phone Number: [phone] 03/02/2021, 3:51 PM  Clinical Narrative:     In-person [place] interpreter requested for 12 pm tomorrow. MD to speak with patient and spouse.   Spoke with [place] at [facility]. Discussed chest tube changing to water seal. [place] needing to identify DME needs for home and to discuss caregiver assistance.   Spoke with Tammy at TB department at [facility] Department. Tammy requesing to be notified at [phone] of patient transition home - preferrably 48 hours in advance.   TOC following for transition needs.   Expected Discharge Plan: Skilled Nursing Facility Barriers to Discharge: Continued Medical Work up  Expected Discharge Plan and Services Expected Discharge Plan: Skilled Nursing Facility     Post Acute Care Choice: Skilled Nursing Facility Living arrangements for the past 2 months: Mobile Home                                       Social Determinants of Health (SDOH) Interventions    Readmission Risk Interventions No flowsheet data found.

## 2021-03-03 ENCOUNTER — Inpatient Hospital Stay (HOSPITAL_COMMUNITY): Payer: BLUE CROSS/BLUE SHIELD

## 2021-03-03 LAB — GLUCOSE, CAPILLARY
Glucose-Capillary: 192 mg/dL — ABNORMAL HIGH (ref 70–99)
Glucose-Capillary: 223 mg/dL — ABNORMAL HIGH (ref 70–99)
Glucose-Capillary: 219 mg/dL — ABNORMAL HIGH (ref 70–99)
Glucose-Capillary: 153 mg/dL — ABNORMAL HIGH (ref 70–99)

## 2021-03-03 NOTE — Progress Notes (Signed)
At approximately 12:10, patient removed IV, telemetry and pulse oximetry monitoring and refused to have them replaced, she stated she wanted to go home.  Patient was instructed to leave pigtail chest tube in place, patient stated understanding.  At approximately 12:45 CT surgery PA entered the room to speak with patient and found chest tube was dislodged.  Attending was also notified of findings.  Please see CT surgery and MD notes for further information.

## 2021-03-03 NOTE — Progress Notes (Signed)
Pt called out for a spill this writer went into room and found chest tube had been pulled out. RN mike made aware Spill cleaned up. Chest tube and box put into red bag and disposed of properly.

## 2021-03-03 NOTE — Progress Notes (Signed)
RT called to assess pts breathing. Upon entering the room pts sats were 84% on 8L HFNC, WOB was increased with accessory muscle use. O2 was increased to 15L and sats increased to 91%. RN and rapid response nurse at bedside.  RT to continue to monitor.

## 2021-03-03 NOTE — Progress Notes (Signed)
Patient ID: [name] [name], female   DOB: 02/14/1956, 65 y.o.   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 Acute hypoxemic respiratory failure. New systolic heart failure.   65 year old female past medical history for type 2 diabetes mellitus, recent vertebral fracture, tobacco abuse, dyslipidemia and hypothyroidism. As an outpatient she was diagnosed with pneumothorax and referred to the hospital. In the ED she was found to have a 90% left tension hydropneumothorax with severe compression atelectasis of the lung medially and marked mediastinal shift. She had a left chest tube placed with good toleration. Her blood pressure was 146/81, heart rate 94, respiratory rate 23, oxygen saturation 98%. She had increased work of breathing, left-sided chest tube in place with decreased breath sounds bilaterally. Heart S1-S2, present rhythmic, soft abdomen, no lower extremity edema.  Sodium 133, potassium 4.3, chloride 97, bicarb 22, glucose 243, BUN 8, creatinine 0.50, white count 9.3, hemoglobin 12.4, hematocrit 38.8, platelets 578. SARS COVID-19 negative.  Chest radiograph with tension left-sided pneumothorax with mediastinal deviation to the right. EKG 118 bpm, normal axis, normal intervals, sinus rhythm, no ST segment T wave changes, positive LVH per EKG criteria.  Follow-up CT chest showed cavitary lesion. She was placed on broad-spectrum antibiotic therapy. Underwent bronchoscopy showing significant amount of purulent secretions on the left side.  She was placed on respiratory isolation, possible TB.  Not able to clamp chest tube due to recurrent pneumothorax.   Patient had worsening  oxygen requirements, placed on heated high flow nasal cannula up to 30 L/min.  She continue to be very weak and deconditioned, microbiology confirmed pulmonary TB (2+AFB), and specific antibiotic was started.   Patient with hypotension, and worsening hypoxemia, back on heated high flow nasal cannula. Started on TB antibiotic regimen 02/20/21  Persistent pneumothorax, with positive air leak, not able to clamp chest tube.    Week 3/17-3/22 summary: Initially plan was to transfer pt to a tertiary center for CTS surgery.Do due her decrease po intake and debility/poor nutrition status, CTS said she is not a surgical candidate. Palliative care was consulted , after much discussion pt and family decided to go home with hospice. Hospice is getting items ready for delivery and setup. Meanwhile pt cannot go home with CT with suction. Discussed with CTS Dr. Cliffton Asters , the only option for her to go home with is to place CT to waterseal and get cxr to see if stable and if she tolerates we can sent her home. Pt is very ademant about going home.  3/22-we discussed with the help of interpreter, with pt and husband about the plans as above with CT . They were agreeable. When CTS PA was notified to place the CT to waterseal, in the afternoon, the CT was out. Unsure if this was an accident or deliberate. Stat cxr was done. CTS was going to replace the tube, however pt refused . She understnads she may develop worsening respiratory distress but she still does not want it to be placed. Plan is to sent her home with ambulance once her equipement is setup and hospice is ready.     Consultants:   ID, PCCM, cardiothoracic, nutrition  Procedures:  Chest tube placement  Antimicrobials:       Subjective: Lying in  bed, mildly sob. No cp  Objective: Vitals:   03/03/21 0815 03/03/21 1044 03/03/21 1100 03/03/21 1516  BP:   (!) 152/71   Pulse: (!) 113 (!) 117 (!) 40 (!) 104  Resp: (!) 24 (!) 26 (!) 30 (!)  28  Temp:      TempSrc:      SpO2: 91% 91% (!) 88% 100%  Weight:      Height:        Intake/Output Summary (Last 24 hours) at 03/03/2021 1850 Last data filed at 03/02/2021 2104 Gross per 24 hour  Intake -  Output 160 ml  Net -160 ml   Filed Weights   03/01/21 0100 03/02/21 0600 03/03/21 0500  Weight: 43.5 kg 43.7 kg 43.3 kg    Examination: Mildly sob, calm cta with poor respiratory effort Regular , s1/s2 no gallops Soft benign +bs No edema Awake and alert   Data Reviewed: I have personally reviewed following labs and imaging studies  CBC: Recent Labs  Lab 02/25/21 0453  WBC 13.1*  NEUTROABS 10.7*  HGB 12.3  HCT 37.2  MCV 78.0*  PLT 233   Basic Metabolic Panel: Recent Labs  Lab 02/25/21 0453  NA 134*  K 3.6  CL 97*  CO2 30  GLUCOSE 83  BUN 17  CREATININE 0.42*  CALCIUM 8.2*   GFR: Estimated Creatinine Clearance: 49.8 mL/min (A) (by C-G formula based on SCr of 0.42 mg/dL (L)). Liver Function Tests: No results for input(s): AST, ALT, ALKPHOS, BILITOT, PROT, ALBUMIN in the last 168 hours. No results for input(s): LIPASE, AMYLASE in the last 168 hours. No results for input(s): AMMONIA in the last 168 hours. Coagulation Profile: No results for input(s): INR, PROTIME in the last 168 hours. Cardiac Enzymes: No results for input(s): CKTOTAL, CKMB, CKMBINDEX, TROPONINI in the last 168 hours. BNP (last 3 results) No results for input(s): PROBNP in the last 8760 hours. HbA1C: No results for input(s): HGBA1C in the last 72 hours. CBG: Recent Labs  Lab 03/02/21 1652 03/02/21 2109 03/03/21 0611 03/03/21 1149 03/03/21 1601  GLUCAP 100* 130* 192* 223* 219*   Lipid Profile: No results for input(s): CHOL, HDL, LDLCALC, TRIG, CHOLHDL, LDLDIRECT in the last 72 hours. Thyroid Function Tests: No results for input(s): TSH, T4TOTAL, FREET4, T3FREE, THYROIDAB in the last 72 hours. Anemia Panel: No results for input(s): VITAMINB12, FOLATE, FERRITIN, TIBC, IRON,  RETICCTPCT in the last 72 hours. Sepsis Labs: No results for input(s): PROCALCITON, LATICACIDVEN in the last 168 hours.  No results found for this or any previous visit (from the past 240 hour(s)).       Radiology Studies: DG CHEST PORT 1 VIEW  Result Date: 03/03/2021 CLINICAL DATA:  Cough and shortness of breath over the last 2 months. Burkitt low CIS. Chest tube removal. EXAM: PORTABLE CHEST 1 VIEW COMPARISON:  03/01/2021 FINDINGS: Left chest tube is been removed. Chronic collapse of the left lung with pleural air and pleural fluid as seen previously. No evidence of tension. Widespread miliary density in the right lung as seen previously without dense consolidation, collapse or effusion. IMPRESSION: Left chest tube removed. Chronic collapse of the left lung with pleural air and pleural fluid as seen previously. No evidence of tension. Chronic miliary infiltrates in the right lung. Electronically Signed   By: Paulina FusiMark  Shogry M.D.   On: 03/03/2021 14:00        Scheduled Meds: . atorvastatin  10 mg Oral QPM  . calcitonin (salmon)  1 spray Alternating Nares Daily  .  cholecalciferol  1,000 Units Oral Daily  . ethambutol  800 mg Oral Daily  . insulin aspart  0-5 Units Subcutaneous QHS  . insulin aspart  0-9 Units Subcutaneous TID WC  . insulin glargine  10 Units Subcutaneous Daily  . isoniazid  300 mg Oral Daily  . levothyroxine  50 mcg Oral QAC breakfast  . lidocaine  1 patch Transdermal Q24H  . pyrazinamide  1,000 mg Oral Daily  . vitamin B-6  50 mg Oral Daily  . 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 Left tension spontaneous hydro-pneumothorax/NEW pulmonary tuberculosis, large cavitary lesion left upper lobe, complicated with superinfection with bacterial pneumonia. Severe sepsis (end-organ failure hypoxemia), present on admission. Acute hypoxemic respiratory failure. CTS and ID following Augmentin for superiposed bacterial infection, plan to stop after for 4 weeks, on 03/18/19 Continue B6 supplementation with paroxetine Recurrent PTX stable since yesterday. CT functioning. CTS following May conisder transfer to tertiary center. 3/18-CTS following patient is not a good surgical candidate due to her nutrition status and refusing therapies Palliative care was consulted Repeat chest x-ray appears to be stable Chest tube continues with large air leak CTS and ID following Continue ethambutol, INH, rifampin, pyrazinamide (started 02/20/21) F/u with health dept, for continued TB tx at discharge 3/20-since not a surgical candidate, plan to go home with hospice care  3/21-cannot go home with chest tube on suction, will try placing chest tube to waterseal with repeat chest x-ray to see if she tolerates.  She can go home only with chest tube on waterseal per CTS 3/22-after planning to place CT to waterseal, the CT was out unsure by accident or intentional. Pt refused replacement. Plan: home with hospice when equipment and hospice is setup   2. 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 Hypo-osmolar hyponatremia, not available urine electrolytes Stable. 3/22-poor nutrition status, now going home with hospice     3. New acute systolic heart failure. EF 20 to 25%, severe decreased LV systolic function, LV with severe akinesis of the left ventricule, mid apical anteroseptal wall and inferior wall.  3/22-stable without exacerbation Initially received lasix Holding cardiac meds due to hypotension       4. T2DMuncontrolled with  hyperglycemia/ dyslpidemia.  BG stable Continue statin. RISS as needed   5. HypothyroidContinuelevothyroxine   6. Moderate calorie protein malnutrition.Continue with nutritional supplements.   7. Thoracic vertebral compression fracture.Pain control with as neededmorphine and tramadol. On PRNalprazolam for anxiety.     DVT prophylaxis: SCD Code Status: DNR Family Communication: husband at bedside. All plans assessment was also d/w pt with help of interpreter extensively today Status is: Inpatient  Remains inpatient appropriate because:Inpatient level of care appropriate due to severity of illness   Dispo: The patient is from: Home              Anticipated d/c is to: Home              Patient currently is not medically stable to d/c.   Difficult to place patient No  Pt plans to go  home with hospice. HH needs to be setup. Possible dc in 1-2 days         LOS: 19 days   Time spent: 35 minutes with more than 50% on COC    Lynn Ito, MD [facility] Pager [phone]  If 7PM-7AM, please contact night-coverage 03/03/2021, 6:50 PM Patient ID: Analisa Sledd, female   DOB: 04/22/1956, 65 y.o.   MRN: [id number]

## 2021-03-03 NOTE — Progress Notes (Signed)
This writer went into pts room to attempt to grab 4p vitals, pt refused to get her vitals done. RN made aware.

## 2021-03-03 NOTE — Progress Notes (Signed)
This Barista, Corporate treasurer, were providing peri care to pt, when pt o2 began to decrease and maintained at 79-83%. RN mike notified immediately. Care team in room now.

## 2021-03-03 NOTE — TOC Progression Note (Signed)
Transition of Care [facility]) - Progression Note    Patient Details  Name: [name] [name] MRN: [id number] Date of Birth: 1956-10-30  Transition of Care [facility]) CM/SW Contact  Leone Haven, RN Phone Number: 03/03/2021, 4:06 PM  Clinical Narrative:    Plan is for patient to go home with hospice tomorrow with [facility].  The chest tube is out today.  [facility] will deliver the DME to the patient's home today.  She will most likely need ambulance transport home tomorrow.    Expected Discharge Plan: Skilled Nursing Facility Barriers to Discharge: Continued Medical Work up  Expected Discharge Plan and Services Expected Discharge Plan: Skilled Nursing Facility     Post Acute Care Choice: Skilled Nursing Facility Living arrangements for the past 2 months: Mobile Home                                       Social Determinants of Health (SDOH) Interventions    Readmission Risk Interventions No flowsheet data found.

## 2021-03-03 NOTE — Progress Notes (Signed)
EPIC Template:  New Hospice at Home Referral Note  [facility])  Received request from [employer] for hospice services at home after discharge.  Chart and pt information under review by [facility] physician.  Hospice eligibility pending at this time.  Hospital liaison spoke with pt's husband Mr. Rothery to continue education related to hospice philosophy and services and to answer any questions at this time.  Mr. Dixson verbalized understanding of information given.  Per discussion the plan is to discharge home tomorrow by [facility]AR.    Pease send signed and completed DNR home with pt/family.  Please provide prescriptions at discharge as needed to ensure ongoing symptom management until pt can be admitted onto hospice services.    DME needs discussed.   Hospital bed, OBT, O2@15L , BSC, and transport chair have been ordered.  Address has been verified and is correct in the chart.  [facility] information and contact numbers given to Mr. Blagg.  Above information shared with Brandon Melnick Manager.  Please call with any questions or concerns.  Thank you for the opportunity to participate in this pt's care.  Gillian Scarce, BSN, RN [employer] [phone] (24h on call)

## 2021-03-03 NOTE — Progress Notes (Addendum)
[address]       [place] [id number]             [phone]      15 Days Post-Op Procedure(s) (LRB): VIDEO BRONCHOSCOPY WITH FLUORO (Left) BRONCHIAL BIOPSIES BRONCHIAL BRUSHINGS BRONCHIAL WASHINGS Subjective:  I initially saw Ms. Parrow at about 8:55 this morning.  She reported her breathing is about the same. Chart was reviewed and plans for trial of placing left chest tube on waterseal was noted. I discussed this with Dr. Cliffton Asters later in the morning and a decision was made to attempt water seal this afternoon and observe her respiratory status closely.   I returned this afternoon with the [place] interpreter to discuss the plan with [name] and her husband but as I entered the room I found the intact pigtail catheter on the floor beside the bed.   There was a clean, dry dressing over the exit site.  Mrs. Brum says her breathing is "better".    Objective: Vital signs in last 24 hours: Temp:  [98.1 F (36.7 C)-99.1 F (37.3 C)] 98.2 F (36.8 C) (03/22 0800) Pulse Rate:  [40-122] 40 (03/22 1100) Resp:  [19-40] 30 (03/22 1100) BP: (93-152)/(63-88) 152/71 (03/22 1100) SpO2:  [88 %-97 %] 88 % (03/22 1100) Weight:  [43.3 kg] 43.3 kg (03/22 0500)  Hemodynamic parameters for last 24 hours:    Intake/Output from previous day: 03/21 0701 - 03/22 0700 In: 220 [P.O.:220] Out: 670 [Urine:450; Chest Tube:220] Intake/Output this shift: No intake/output data recorded.  General appearance: Thin,cachectic female resting quietly in bed in no distress. Her husband and the [place] interpreter are at the bedside.  Neurologic: no obvious deficits.  Heart: Sinus tachycardia.  Lungs: absent breath soundes on the left, coarse breath sounds on the right.  Wound: the left chest tube site is covered with a dry dressing  Lab Results: No results for input(s): WBC, HGB, HCT, PLT in the last 72 hours. BMET: No results for input(s): NA, K, CL, CO2, GLUCOSE,  BUN, CREATININE, CALCIUM in the last 72 hours.  PT/INR: No results for input(s): LABPROT, INR in the last 72 hours. ABG No results found for: PHART, HCO3, TCO2, ACIDBASEDEF, O2SAT CBG (last 3)  Recent Labs    03/02/21 2109 03/03/21 0611 03/03/21 1149  GLUCAP 130* 192* 223*    Assessment/Plan: S/P Procedure(s) (LRB): VIDEO BRONCHOSCOPY WITH FLUORO (Left) BRONCHIAL BIOPSIES BRONCHIAL BRUSHINGS BRONCHIAL WASHINGS  Mst. Goeser has TB with severe left cavitary lung lesions and has had a large air leak managed with a pigtail catheter for several days. She is now designated DNR and is wanting to go home with palliative care services.  We were planing a trial of having the chest tube to water seal this afternoon to see if she could tolerate having the tube off suction but the tube is now out.  It is not clear to me if this was accidental or deliberate. Currently , she appears to be comfortable with no increase in work of breathing compared with her status when I saw her earlier this morning. She is on 15L high flow O2.  Will get a stat portable CXR now and further recommendations will follow.    LOS: 19 days    Parke Poisson [phone] 03/03/2021   ADDENDUM:  13:40 The PCXR has not been read but I have reviewed it. There is a slight increase in the medial component of the left pneumothorax and no significant  change in the diffuse nodularity on the right.  She does have some mildly increased work of breathing compared to when I saw her last hour.  With the interpreter present, I asked Mrs. Herendeen if she would consent to having the tube replaced if she were to develop worsening respiratory distress and Mrs. Bolger said she would not.  Unfortunately, CT surgery has nothing further to add to Mrs. Woollard' care. She desires to be discharged to home for palliative care and I believe this is appropriate.   M.Sosaia Pittinger, PA-C  ADDENDUM

## 2021-03-03 NOTE — Significant Event (Addendum)
Rapid Response Event Note   Reason for Call :  Notified by RT for increased work of breathing, increased oxygen requirements  Prior to acute oxygen desaturation, pt was on 8LNC. NT was at bedside providing peri-care when acute respiratory distress onset occurred.   Initial Focused Assessment:  Pt lying in bed, alert. Skin is warm, moist. Lung sounds are diminished on the left, chest tube suction is heart upon auscultation. Lung sounds are clear on the right. Pt able to speak in short sentences, mild accessory muscle use noted. Pt is tachypneic, RR 30, although tachypnea is noted at baseline. Chest tube remains on -20cm suction, 5/7 air leak noted.   VS: BP 152/71, HR 123, RR 30, SpO2 88% on 15L HFNC  Interventions:  -15L HFNC per RT -Chest tube insertion site redressed by primary RN  Plan of Care:  -Wean oxygen as pt tolerates -Monitor breathing pattern, notify if pt continues increased work of breathing -PRN Morphine and xanax available  Call rapid response for additional needs  Event Summary:  MD Notified: RN to update attending MD Call Time: 1037 Arrival Time: 1040 End Time: 1100  Jennye Moccasin, RN

## 2021-03-03 NOTE — Progress Notes (Addendum)
Patient refused assessment. Continues to refuses vital signs and IV placement. Did allow CBG check (see results) and HS meds (see MAR).  Continues to refuse telemetry.  Addendum: as of 0401 3/23 continues to refuse vitals and telemetry but did receive bathroom assistance. Ambulated to bedside commode.

## 2021-03-03 NOTE — Progress Notes (Signed)
Nutrition Follow-up  DOCUMENTATION CODES:   Non-severe (moderate) malnutrition in context of chronic illness,Underweight  INTERVENTION:   -Continue MVI with minerals daily -Continue [place] yogurt with meals -Continue liberalized diet of regular  NUTRITION DIAGNOSIS:   Moderate Malnutrition related to chronic illness (DM) as evidenced by energy intake < or equal to 75% for > or equal to 1 month,mild fat depletion,moderate fat depletion,mild muscle depletion,moderate muscle depletion.  Ongoing  GOAL:   Patient will meet greater than or equal to 90% of their needs  Progressing   MONITOR:   PO intake,Supplement acceptance,Labs,Weight trends,Skin,I & O's  REASON FOR ASSESSMENT:   Consult Assessment of nutrition requirement/status  ASSESSMENT:   [name] [name] is a 65 y.o. female with a PMH significant for type 2 diabetes, recent vertebral fracture, tobacco use, hyperlipidemia, hypothyroidism.  3/7 - video bronchoscopy w/ fluoro (left)  3/9 - rapid response, respiratory distress, tachycardia   Reviewed I/O's: -450 ml x 24 hours and -2.7 L since 02/17/21  UOP: 450 ml x 24 hours   Chest tube: 220 nl x 24 hours  Pt receiving nursing care at time of visit. Rapid response called due to desaturations; pt is desatting with minimal activity.   Pt remains with variable intake. Noted meal completions 25-100%, averaging around 25-50% of meals. Pt is resistant to consume oral nutrition supplements. Pt husband has been bringing in her outside food to eat.   Wt has been stable since admission.  Per notes, plan to take pt home with hospice.   Medications reviewed and include vitamin D3.   Labs reviewed: CBGS: 100-192.   Diet Order:   Diet Order            Diet regular Room service appropriate? Yes; Fluid consistency: Thin  Diet effective now                 EDUCATION NEEDS:   Education needs have been addressed  Skin:  Skin Assessment: Skin Integrity Issues: Skin  Integrity Issues:: Stage II Stage II: mid sacrum  Last BM:  03/03/21  Height:   Ht Readings from Last 1 Encounters:  02/12/21 5\' 6"  (1.676 m)    Weight:   Wt Readings from Last 1 Encounters:  03/03/21 43.3 kg    Ideal Body Weight:  59.1 kg  BMI:  Body mass index is 15.41 kg/m.  Estimated Nutritional Needs:   Kcal:  1800-2000  Protein:  100-115 grams  Fluid:  > 1.8 L    03/05/21, RD, LDN, CDCES Registered Dietitian II Certified Diabetes Care and Education Specialist Please refer to [facility] for RD and/or RD on-call/weekend/after hours pager

## 2021-03-03 NOTE — Progress Notes (Signed)
PT Cancellation Note  Patient Details Name: [name] [name] MRN: [id number] DOB: 15-Jan-1956   Cancelled Treatment:    Reason Eval/Treat Not Completed: Other (comment). Noted events with NT earlier, pt desaturating with minimal activity, requiring 15L O2 HFNC to recover. Pt also continues to express little to no desire to work with PT services; RN aware. Will sign off; please reconsult if new needs arise.  Ina Homes, PT, DPT Acute Rehabilitation Services  Pager [phone] Office [phone]  Malachy Chamber 03/03/2021, 11:56 AM

## 2021-03-04 LAB — GLUCOSE, CAPILLARY
Glucose-Capillary: 97 mg/dL (ref 70–99)
Glucose-Capillary: 164 mg/dL — ABNORMAL HIGH (ref 70–99)

## 2021-03-04 MED ORDER — PYRIDOXINE HCL 50 MG PO TABS: 50.0000 mg | ORAL_TABLET | Freq: Every day | ORAL | 0 refills | Status: AC

## 2021-03-04 MED ORDER — POTASSIUM CHLORIDE CRYS ER 10 MEQ PO TBCR: 10.0000 meq | EXTENDED_RELEASE_TABLET | Freq: Every day | ORAL | 0 refills | Status: AC | PRN

## 2021-03-04 MED ORDER — ETHAMBUTOL HCL 400 MG PO TABS: 800.0000 mg | ORAL_TABLET | Freq: Every day | ORAL | 0 refills | Status: AC

## 2021-03-04 MED ORDER — ACETAMINOPHEN 325 MG PO TABS: 650.0000 mg | ORAL_TABLET | Freq: Four times a day (QID) | ORAL | Status: AC | PRN

## 2021-03-04 MED ORDER — RIFAMPIN 300 MG PO CAPS: 600.0000 mg | ORAL_CAPSULE | Freq: Every day | ORAL | 0 refills | Status: AC

## 2021-03-04 MED ORDER — FUROSEMIDE 20 MG PO TABS: 20.0000 mg | ORAL_TABLET | Freq: Every day | ORAL | 0 refills | Status: AC | PRN

## 2021-03-04 MED ORDER — IPRATROPIUM-ALBUTEROL 20-100 MCG/ACT IN AERS: 1.0000 | INHALATION_SPRAY | Freq: Four times a day (QID) | RESPIRATORY_TRACT | 0 refills | Status: AC | PRN

## 2021-03-04 MED ORDER — FUROSEMIDE 20 MG PO TABS: 20.0000 mg | ORAL_TABLET | Freq: Every day | ORAL | Status: DC | PRN

## 2021-03-04 MED ORDER — MORPHINE SULFATE (CONCENTRATE) 10 MG/0.5ML PO SOLN: 10.0000 mg | ORAL | 0 refills | Status: AC | PRN

## 2021-03-04 MED ORDER — ISONIAZID 300 MG PO TABS: 300.0000 mg | ORAL_TABLET | Freq: Every day | ORAL | 0 refills | Status: AC

## 2021-03-04 MED ORDER — PYRAZINAMIDE 500 MG PO TABS: 1000.0000 mg | ORAL_TABLET | Freq: Every day | ORAL | 0 refills | Status: AC

## 2021-03-04 MED ORDER — POTASSIUM CHLORIDE CRYS ER 10 MEQ PO TBCR: 10.0000 meq | EXTENDED_RELEASE_TABLET | Freq: Every day | ORAL | Status: DC | PRN

## 2021-03-04 MED ORDER — MORPHINE SULFATE (CONCENTRATE) 10 MG/0.5ML PO SOLN
10.0000 mg | ORAL | Status: DC | PRN
  Administered 2021-03-04: 10 mg via ORAL
  Filled 2021-03-04: qty 0.5

## 2021-03-04 NOTE — TOC Transition Note (Addendum)
Transition of Care [facility]) - CM/SW Discharge Note   Patient Details  Name: [name] [name] MRN: [id number] Date of Birth: 17-May-1956  Transition of Care [facility]) CM/SW Contact:  Leone Haven, RN Phone Number: 2021/04/03, 12:13 PM   Clinical Narrative:    NCM spoke with Lara Mulch at the [facility] , she states she needs to confirm address where patient is going. The interpreter asked patient the address which is [address] [place] [address].  Phone is [phone] (cell).  Interpreter speaking with patient and Tammy, also interpreter will be here when discharge is done.  NCM faxed the dc summary and demographic sheet to [facility] Dept [phone].  The Health Dept will have to clear patient for discharge before this NCM can call ptar transport.  1:35-  NCM contacted Tammy at Health Dept to see if she received the fax.  She is checking fax machine.  NCM spoke with patient spouse, he will be picking up the medications at the [facility] on [address] and will pick up the pyrazinanide at [facility] at [address] because they did ot have it at [facility] Awaiting Health dept to clear patient .  Patient has been cleared by the Health dept.  NCM scheduled ptar for 3:30 pickup. Informed ptar to wear N95's. [facility] at [place] did not have the inhaler or the pyrazinanide so [facility] at [address] has ordered it and it will be there tomorrow.   Final next level of care: Home w Hospice Care Barriers to Discharge:  (waiting to be cleared by Health Dept)   Patient Goals and CMS Choice Patient states their goals for this hospitalization and ongoing recovery are:: Rehab CMS Medicare.gov Compare Post Acute Care list provided to:: Patient Choice offered to / list presented to : Patient  Discharge Placement                       Discharge Plan and Services     Post Acute Care Choice: Skilled Nursing Facility                    HH Arranged:  RN [facility] Agency:  Marcell Anger) Date [facility] Agency Contacted: 03/02/21 Time HH Agency Contacted: 1000 Representative spoke with at [facility] Agency: Chrislyn  Social Determinants of Health (SDOH) Interventions     Readmission Risk Interventions No flowsheet data found.

## 2021-03-04 NOTE — Plan of Care (Signed)
DISCHARGE NOTE HOME [name] [name] to be discharged home per MD order. In person interpreter was utilized for the following.  Discussed prescriptions and follow up appointments with the patient. Medication list explained in detail. Patient verbalized understanding.  Reviewed plan for health department nurse to visit and watch patient take TB medications.  Reviewed plan for hospice nurse to visit patient after she gets home today.   An After Visit Summary (AVS) was printed and given to the patient. Patient to be transported home by ambulance.  Arlice Colt, RN

## 2021-03-04 NOTE — Progress Notes (Signed)
Daily Progress Note   Patient Name: [name] [name]       Date: 03/11/21 DOB: [birth year]  Age: 65 y.o. MRN#: [id number] Attending Physician: Coralie Keens Primary Care Physician: Truett Perna, MD Admit Date: 02/12/2021  Reason for Consultation/Follow-up: Establishing goals of care  Subjective: Patient in room with significant expectoration of mucus. Complains of pain in her buttocks.  Goals of care previously established with PalliativeMarcelino Duster, NP- discharge home with hospice.  Noted referral for hospice has been made and hoping for d/c soon. She has pulled out her chest tube and is wishing to go home.   ROS  Length of Stay: 20  Current Medications: Scheduled Meds:  . atorvastatin  10 mg Oral QPM  . calcitonin (salmon)  1 spray Alternating Nares Daily  . cholecalciferol  1,000 Units Oral Daily  . ethambutol  800 mg Oral Daily  . insulin aspart  0-5 Units Subcutaneous QHS  . insulin aspart  0-9 Units Subcutaneous TID WC  . insulin glargine  10 Units Subcutaneous Daily  . isoniazid  300 mg Oral Daily  . levothyroxine  50 mcg Oral QAC breakfast  . lidocaine  1 patch Transdermal Q24H  . pyrazinamide  1,000 mg Oral Daily  . vitamin B-6  50 mg Oral Daily  . rifampin  600 mg Oral Daily    Continuous Infusions:    PRN Meds: acetaminophen **OR** acetaminophen, albuterol, ALPRAZolam, Ipratropium-Albuterol, morphine injection, morphine CONCENTRATE, traMADol  Physical Exam Vitals and nursing note reviewed.  Constitutional:      Appearance: She is ill-appearing.             Vital Signs: BP (!) 152/71 (BP Location: Right Arm)   Pulse (!) 104   Temp 98.2 F (36.8 C) (Oral)   Resp (!) 28   Ht 5\' 6"  (1.676 m)   Wt 43.3 kg   SpO2 100%   BMI 15.41 kg/m  SpO2:  SpO2: 100 % O2 Device: O2 Device: High Flow Nasal Cannula O2 Flow Rate: O2 Flow Rate (L/min): 15 L/min  Intake/output summary:   Intake/Output Summary (Last 24 hours) at 2021-03-11 1031 Last data filed at 03-11-21 0728 Gross per 24 hour  Intake 480 ml  Output --  Net 480 ml   LBM: Last BM Date: 03/01/21 Baseline Weight: Weight: 61.2 kg Most recent  weight: Weight: 43.3 kg          Patient Active Problem List   Diagnosis Date Noted  . Pressure injury of skin 02/28/2021  . 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 Tuberculosis   . Cavitary lesion of lung   . Lobar pneumonia, unspecified organism (HCC)   . Malnutrition of moderate degree 02/16/2021  . Pneumothorax on left 02/12/2021  . Pneumothorax 02/12/2021  . Weight loss   . Compression fracture of T12 vertebra (HCC) 01/22/2021  . Type II diabetes mellitus with proliferative retinopathy (HCC) 03/07/2020  . Varicose vein of leg 03/07/2020  . B12 deficiency 03/07/2020  . Hyperlipidemia associated with type 2 diabetes mellitus (HCC) 03/07/2020  . RHUS DERMATITIS 03/23/2008  . FIBROCYSTIC BREAST DISEASE 03/01/2008  . FOLLICULITIS 03/01/2008  . 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 ACNE, ROSACEA 12/25/2007  . THYROID MASS 09/07/2007    Palliative Care Assessment & Plan   Patient Profile: Per intake H&P -->Mrs. 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 Acute hypoxemic respiratory failure.New systolic heart failure.  Palliative care has been asked to discuss goals of care.  Assessment/Recommendations/Plan  Morphine order placed for pain and shortness of breath Accomodations being made for discharge home with Hospice  today  Goals of Care and Additional Recommendations: Limitations on Scope of Treatment: Full Comfort Care  Code Status: DNR  Prognosis:  < 6 months  Discharge Planning: Home with Hospice  Care plan was discussed with patient care team.  Thank you for allowing the Palliative Medicine Team to assist in the care of this patient.   Total time: 26 minutes Greater than 50%  of this time was spent counseling and coordinating care related to the above assessment and plan.  Ocie Bob, AGNP-C Palliative Medicine   Please contact Palliative Medicine Team phone at [phone] for questions and concerns.

## 2021-03-04 NOTE — Discharge Summary (Addendum)
Physician Discharge Summary  Denell Cothern ZOX:[id number] DOB: 16-Jun-1956 DOA: 02/12/2021  PCP: Truett Perna, MD  Admit date: 02/12/2021 Discharge date: 13-Mar-2021  Admitted From: Home  Disposition:  Home with hospice.   Recommendations for Outpatient Follow-up and new medication changes:  1. Follow up with Dr. Regino Schultze in 10 days.  2. Follow up with the health department to continue with TB treatment. 3. Patient declined replacement of left chest tube, she is aware of possibility of worsening pneumothorax and death, she has decided to go home with hospice services.   Home Health: hospice   Equipment/Devices: home 02    Discharge Condition: stable  CODE STATUS: DNR   Diet recommendation: diabetic prudent.   Brief/Interim Summary: Mrs. 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 Acute hypoxemic respiratory failure. New systolic heart failure.   65 year old female past medical history for type 2 diabetes mellitus, recent vertebral fracture, tobacco abuse, dyslipidemia and hypothyroidism. As an outpatient she was diagnosed with pneumothorax and referred to the hospital. In the ED she was found to have a 90% left tension hydropneumothorax with severe compression atelectasis of the lung medially and marked mediastinal shift. She had a left chest tube placed with good toleration. Her blood pressure was 146/81, heart rate 94, respiratory rate 23, oxygen saturation 98%. She had increased work of breathing, left-sided chest tube in place with decreased breath sounds bilaterally. Heart S1-S2, present rhythmic, soft abdomen, no lower extremity edema.  Sodium 133, potassium 4.3, chloride 97, bicarb 22, glucose 243, BUN 8, creatinine 0.50, white count 9.3, hemoglobin 12.4, hematocrit 38.8, platelets 578. SARS COVID-19 negative.  Chest  radiograph with tension left-sided pneumothorax with mediastinal deviation to the right. EKG 118 bpm, normal axis, normal intervals, sinus rhythm, no ST segment T wave changes, positive LVH per EKG criteria.  Follow-up CT chest showed left upper lobe cavitary lesion. She was placed on broad-spectrum antibiotic therapy. Underwent bronchoscopy showing significant amount of purulent secretions on the left side.  She was placed on respiratory isolation, possible TB.  Not able to clamp chest tube due to recurrent pneumothorax.   Patient had worsening oxygen requirements, placed on heated high flow nasal cannula up to 30 L/min.  She continue to be very weak and deconditioned, microbiology confirmed pulmonary TB (2+AFB), and specific antibiotic therapy was started.   Patient with hypotension, and worsening hypoxemia, back on heated high flow nasal cannula. Started on TB antibiotic regimen 02/20/21  Persistent pneumothorax, with positive air leak, not able to clamp chest tube.  Patient may need decortication per CT surgery, but her physical functional status is very low due to severe deconditioning.   She had high respiratory rate, rapid shallow breathing, due to asvance deconditioning.   Her oxygen requirements have been variable, at time of discharge is 8 to 10 L/min per [place].   Patient decided to continue care under hospice.  Initially plan was to discharge patient with chest tube to waterseal, unfortunately tube was dislodged and came out.  Unclear if this was accidental or intentional.  Follow-up chest radiograph with persistent left pneumothorax.  She declined chest tube to be replaced.  She has agreed to continue taking oral antibiotic therapy.  1. Left tension spontaneous hydro-pneumothorax/NEW pulmonary tuberculosis, large cavitary lesion left upper lobe, complicated with superinfection with bacterial pneumonia. Severe sepsis (end-organ failure hypoxemia), present on  admission. Acute hypoxemic respiratory failure.  Patient has decided to go  home with hospice services, she will continue taking antibiotic therapy with ethambutol, isoniazid and rifampin.  She has declined to have chest tube replaced.  She is aware of potential worsening including death. Plan to follow-up with the health department to continue tuberculosis treatment.  At the time of her discharge she continued to have high oxygen requirements, 8 to 10 L/min per nasal cannula. Hospice follow-up at home, added morphine SA for pain or dyspnea.  Patient completed Augmentin regimen during her hospitalization.  Continue as needed bronchodilator therapy as needed.   2. 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 Hypo-osmolar hyponatremia, not available urine electrolytes  Patient received supportive medical therapy, at discharge sodium 134, potassium 3.6, chloride 97, bicarb 30, glucose 83, BUN 17, creatinine 0.42.  3. New acute systolic heart failure. EF 20 to 25%, severe decreased LV systolic function, LV with severe akinesis of the left ventricule, mid apical anteroseptal wall and inferior wall.   Patient did receive furosemide during her hospitalization.  She is very deconditioned and weak, at this point will continue furosemide only as needed, take along with potassium supplements.  3. T2DMuncontrolled with hyperglycemia/ dyslpidemia.   Patient was placed on insulin therapy including basal and sliding scale with good toleration At discharge resume metformin and glipizide.   Her capillary glucose at discharge 153-97.  Onatorvastatin.   4. Hypothyroidpatient will continue taking levothyroxine   5. Moderate calorie protein malnutrition/ stage 2 sacrum pressure ulcer not present on admission. She was placed nutritional supplements, follow-up as outpatient.  Continue skin care for pressure ulcer.   6. Thoracic vertebral compression fracture.  No significant pain at time of discharge continue for tramadol and morphine.  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   atorvastatin 10 MG tablet Commonly known as: LIPITOR Take 10 mg by mouth every evening.   B-12 PO Take 1 tablet by mouth daily.   ethambutol 400 MG tablet Commonly known as: MYAMBUTOL Take 2 tablets (800 mg total) by mouth daily. Start taking on: March 05, 2021   FreeStyle Libre Reader [facility] 1 Device by Does not apply route as directed.   FreeStyle [employer] System Misc 1 Device by Does not apply route 3 (three) times daily as needed.   furosemide 20 MG tablet Commonly known as: LASIX Take 1 tablet (20 mg total) by mouth daily as needed for fluid or edema (leg swelling or shortness of breath).   glimepiride 2 MG tablet Commonly known as: AMARYL Take 2 mg by mouth daily with breakfast.   Ipratropium-Albuterol 20-100 MCG/ACT Aers respimat Commonly known as: COMBIVENT Inhale 1 puff into the lungs every 6 (six) hours as needed for wheezing.   isoniazid 300 MG tablet Commonly known as: NYDRAZID Take 1 tablet (300 mg total) by mouth daily. Start taking on: March 05, 2021   levothyroxine 50 MCG tablet Commonly known as: SYNTHROID Take 50 mcg by mouth daily before breakfast.   metFORMIN 1000 MG tablet Commonly known as: GLUCOPHAGE Take  1,000 mg by mouth 2 (two) times daily with a meal.   morphine CONCENTRATE 10 MG/0.5ML Soln concentrated solution Take 0.5 mLs (10 mg total) by mouth every 4 (four) hours as needed for moderate pain, severe pain or shortness of breath.   potassium chloride 10 MEQ tablet Commonly known as: KLOR-CON Take 1 tablet (10 mEq total) by mouth daily as needed (take only with furosemide.).   pyrazinamide 500 MG tablet Take 2 tablets (1,000 mg total) by mouth daily. Start taking on: March 05, 2021   pyridOXINE 50 MG tablet Commonly known as: B-6 Take 1 tablet (50 mg total) by mouth daily. Start taking on: March 05, 2021   rifampin 300 MG capsule Commonly known as: RIFADIN Take 2 capsules (600 mg total) by mouth daily. Start taking on: March 05, 2021   traMADol 50 MG tablet Commonly known as: ULTRAM Take 1 tablet (50 mg total) by mouth every 8 (eight) hours as needed.   VITAMIN D3 PO Take 1 tablet by mouth daily.       Follow-up Information    [facility] Department TB nurse Follow up.   Contact information: [phone]       [facility] Follow up.   Why: home hospice Contact information: [address] [facility] [place] [address] [phone]             No Known Allergies  Consultations:  Pulmonary   CT surgery   ID    Procedures/Studies: DG Chest 1 View  Result Date: 02/18/2021 CLINICAL DATA:  Pneumothorax EXAM: CHEST  1 VIEW COMPARISON:  02/16/2021 FINDINGS: Since the prior examination, a laterally loculated pneumothorax has developed. Pigtail chest tube is unchanged in position. There is dense opacification of the now partially collapsed left lower lobe. Cavitary lesion noted at the left apex, unchanged. Patchy infiltrate within the right mid and upper lung zone appears stable. No pneumothorax or pleural effusion on the right. Cardiac size within normal limits. Mild mediastinal shift to the right, however, suggest at  least some degree of tension physiology. IMPRESSION: Interval development of a left laterally loculated pneumothorax demonstrating some degree of tension physiology with mediastinal shift to the right. Left pigtail chest tube is unchanged in position within the pleural space and correlation for appropriate function of the chest tube is recommended Stable left apical cavitary lesion. Stable dense consolidation of the left lung and multifocal pulmonary infiltrate within the right mid and upper lung zone, likely infectious. These results will be called to the ordering clinician or representative by the Radiologist Assistant, and communication documented in the PACS or [employer]. Electronically Signed   By: Helyn Numbers MD   On: 02/18/2021 03:58   CT ANGIO CHEST PE W OR WO CONTRAST  Result Date: 02/12/2021 CLINICAL DATA:  Chest pain, short of breath, history of pneumothorax, abnormal chest x-ray EXAM: CT ANGIOGRAPHY CHEST WITH CONTRAST TECHNIQUE: Multidetector CT imaging of the chest was performed using the standard protocol during bolus administration of intravenous contrast. Multiplanar CT image reconstructions and MIPs were obtained to evaluate the vascular anatomy. CONTRAST:  43mL OMNIPAQUE IOHEXOL 350 MG/ML SOLN COMPARISON:  02/12/2021 FINDINGS: Cardiovascular: This is a technically adequate evaluation of the pulmonary vasculature. No filling defects or pulmonary emboli. The heart is unremarkable without pericardial effusion. Mild dilation and ectasia of  the ascending thoracic aorta without frank aneurysm. No evidence of dissection. Minimal atherosclerosis. Mediastinum/Nodes: No enlarged mediastinal, hilar, or axillary lymph nodes. Thyroid gland, trachea, and esophagus demonstrate no significant findings. Lungs/Pleura: There is a chest tube within the left lateral pleural space, with near complete resolution of the left-sided pneumothorax seen previously. There is a trace left pleural effusion. Large  thick-walled cavity replaces the majority of the left upper lobe, with dense consolidation throughout the lingula. Similarly, there is a thick-walled cavity within the superior segment of the left lower lobe, with extensive dense airspace disease throughout the remainder of the left lower lobe. There is patchy tree in bud nodular consolidation throughout the right upper lobe. Multiple small pulmonary nodules are identified within the right upper lobe, largest measuring approximately 9 mm in diameter. No right-sided effusion or pneumothorax. The central airways are patent. Upper Abdomen: No acute abnormality. Musculoskeletal: Chronic T12 compression fracture is noted. Greater than 50% loss of height. No retropulsion. No acute bony abnormalities. Reconstructed images demonstrate no additional findings. Review of the MIP images confirms the above findings. IMPRESSION: 1. Bilateral areas of airspace disease, much more pronounced throughout the left lung with areas of dense consolidation and thick-walled cavities as above. Overall, findings favor atypical infection such as fungal pneumonia or even tuberculosis. Sputum analysis may be useful. Cavitating malignancy cannot be completely excluded, though the widespread disease would be atypical. 2. Multiple subcentimeter solid nodules within the right upper lobe, largest measuring 9 mm. 3. Trace residual left pneumothorax with indwelling left chest tube as above. 4. Trace left pleural effusion. 5. No evidence of pulmonary embolus. 6.  Aortic Atherosclerosis (ICD10-I70.0). Electronically Signed   By: Sharlet SalinaMichael  Brown M.D.   On: 02/12/2021 22:01   DG CHEST PORT 1 VIEW  Result Date: 03/03/2021 CLINICAL DATA:  Cough and shortness of breath over the last 2 months. Burkitt low CIS. Chest tube removal. EXAM: PORTABLE CHEST 1 VIEW COMPARISON:  03/01/2021 FINDINGS: Left chest tube is been removed. Chronic collapse of the left lung with pleural air and pleural fluid as seen  previously. No evidence of tension. Widespread miliary density in the right lung as seen previously without dense consolidation, collapse or effusion. IMPRESSION: Left chest tube removed. Chronic collapse of the left lung with pleural air and pleural fluid as seen previously. No evidence of tension. Chronic miliary infiltrates in the right lung. Electronically Signed   By: Paulina FusiMark  Shogry M.D.   On: 03/03/2021 14:00   DG CHEST PORT 1 VIEW  Result Date: 03/01/2021 CLINICAL DATA:  Evaluate pneumothorax EXAM: PORTABLE CHEST 1 VIEW COMPARISON:  February 28, 2021 FINDINGS: Mediastinal contour and cardiac silhouette are stable. Left chest tube is unchanged. Left pneumothorax is unchanged compared prior exam. Collapse of the left lung is unchanged. Diffuse right pulmonary nodularity is unchanged. The mediastinal contour and cardiac silhouette are stable. IMPRESSION: Left pneumothorax is unchanged compared prior exam. Left chest tube is unchanged. Electronically Signed   By: Sherian ReinWei-Chen  Lin M.D.   On: 03/01/2021 09:19   DG Chest Port 1 View  Result Date: 02/28/2021 CLINICAL DATA:  65 year old female with acute respiratory distress. Pneumothorax. EXAM: PORTABLE CHEST 1 VIEW COMPARISON:  Chest radiograph dated 02/27/2021. FINDINGS: Left-sided chest tube in similar position. Persistent large left pneumothorax increased since the study of 02/27/2021 but similar to 02/25/2021. There is collapse of the majority of the left lung. Diffuse right lung nodular infiltrates similar to prior radiograph. No pleural effusion. Is stable cardiomediastinal silhouette. No acute osseous pathology. IMPRESSION:  1. Persistent large left pneumothorax with collapse of the majority of the left lung. Left-sided chest tube in similar position. 2. Diffuse right lung infiltrate. Electronically Signed   By: Elgie Collard M.D.   On: 02/28/2021 20:27   DG CHEST PORT 1 VIEW  Result Date: 02/27/2021 CLINICAL DATA:  Pneumothorax EXAM: PORTABLE CHEST 1  VIEW COMPARISON:  Portable exam 0640 hours compared to 02/25/2021 FINDINGS: Pigtail thoracostomy tube again identified, pigtail near the lateral costal margin. Again identified loculated pneumothorax at the lateral LEFT inferior hemithorax with significant atelectasis of the LEFT lung. Cavitary lesion in the LEFT upper lobe unchanged. Diffuse infiltrates RIGHT lung unchanged. Decrease in LEFT pleural effusion. No RIGHT pneumothorax or acute osseous findings. IMPRESSION: Persistent loculated LEFT basilar pneumothorax with extensive atelectasis of LEFT lung and persistent LEFT upper lobe cavitary lesion. Persistent diffuse RIGHT lung infiltrates. Electronically Signed   By: Ulyses Southward M.D.   On: 02/27/2021 08:21   DG CHEST PORT 1 VIEW  Result Date: 02/25/2021 CLINICAL DATA:  Pneumothorax, chest tube, follow-up EXAM: PORTABLE CHEST 1 VIEW COMPARISON:  Portable exam 0555 hours compared to 02/24/2021 FINDINGS: Pigtail LEFT thoracostomy tube unchanged. Stable heart size mediastinal contours. Persistent large loculated pneumothorax lower LEFT chest with associated small pleural effusion. Atelectasis of lingula and LEFT lower lobe. Cavitary region within LEFT upper lobe unchanged. Diffuse infiltrates in RIGHT lung, greater at RIGHT lung base, unchanged. IMPRESSION: Persistent large loculated LEFT hydropneumothorax. Persistent cavitary region in LEFT upper lobe. RIGHT lung infiltrates especially at RIGHT lung base unchanged. Electronically Signed   By: Ulyses Southward M.D.   On: 02/25/2021 08:10   DG CHEST PORT 1 VIEW  Result Date: 02/24/2021 CLINICAL DATA:  Persistent pneumothorax EXAM: PORTABLE CHEST 1 VIEW COMPARISON:  February 23, 2021 FINDINGS: Chest tube on the left again noted with stable pneumothorax on the left. Consolidation throughout the left mid and lower lung zones remains without apparent change. Cavitary lesion left upper lobe unchanged. Diffuse reticulonodular stage ule prominence throughout the right lung  is stable. Subtle ill-defined opacity right base may represent developing pneumonia in this area. Heart size within normal limits. Pulmonary vascularity unchanged. No bone lesions. IMPRESSION: Question developing infiltrate right base with subtle airspace opacity in this area, new from 1 day prior. Diffuse reticulonodular opacities throughout the right lung remains. Stable pneumothorax on the left with chest tube in place. Consolidation left mid and lower lung regions as well as apparent cavitary lesion left upper lobe stable. Stable cardiac silhouette. Electronically Signed   By: Bretta Bang III M.D.   On: 02/24/2021 08:07   DG Chest Port 1 View  Result Date: 02/23/2021 CLINICAL DATA:  Pneumothorax.  Chest tube in place EXAM: PORTABLE CHEST 1 VIEW COMPARISON:  February 21, 2021. FINDINGS: Pigtail catheter remains on the left with pneumothorax seen laterally, medially, inferiorly, mildly larger than on previous study. No tension component evident. There is consolidation throughout the left mid and lower lung regions. Apparent cavitary lesions stable in the left upper lobe. Reticulonodular opacities throughout the right lung are stable. Heart size is within normal limits. No appreciable adenopathy. No bone lesions. IMPRESSION: Pneumothorax on the left is slightly larger with pigtail catheter again noted on the left. Consolidation throughout left mid and lower lungs as with cavitary lesion left upper lobe. Reticulonodular opacities remain on the right. No consolidation on the right. Stable cardiac silhouette. Electronically Signed   By: Bretta Bang III M.D.   On: 02/23/2021 07:53   DG CHEST PORT 1  VIEW  Result Date: 02/21/2021 CLINICAL DATA:  Known pneumothorax with chest tube present. Shortness of breath EXAM: PORTABLE CHEST 1 VIEW COMPARISON:  February 20, 2021 chest radiograph. Chest CT February 12, 2021 FINDINGS: Pigtail catheter remains on the left. Left-sided pneumothorax slightly smaller. No tension  component. There is persistent consolidation throughout left mid and lower lung regions. Apparent cavitary lesion in left upper lobe appears essentially stable. There are areas of patchy airspace opacity with nodularity on the right, stable. No new opacity evident. Heart size within normal limits. Pulmonary vascularity stable. No adenopathy appreciable by radiography. IMPRESSION: Pneumothorax on the left slightly smaller with pigtail catheter present. No tension component. Nodular areas of opacity with interstitial thickening on the right remains stable. Apparent cavitary lesion left upper lobe. Appearance the lungs overall similar to 1 day prior. Stable cardiac silhouette. Electronically Signed   By: Bretta Bang III M.D.   On: 02/21/2021 08:22   DG Chest Port 1 View  Result Date: 02/20/2021 CLINICAL DATA:  Pneumothorax. EXAM: PORTABLE CHEST 1 VIEW COMPARISON:  Radiograph earlier this day.  CT 02/12/2021 FINDINGS: Pigtail catheter in the left mid hemithorax. Increased size of pneumothorax with moderate component inferior laterally. Lucency at the apex appears to correspond to thick-walled cavitary process on CT. Persistent patchy opacity throughout the left lower hemithorax. Right lung nodularity appears similar. There is no rightward mediastinal shift. IMPRESSION: 1. Re-accumulation of left pneumothorax with pigtail catheter in place, now moderate in size. No rightward mediastinal shift. 2. Unchanged acutely nodular opacity throughout the right lung and consolidation at the left mid lower lung zone. 3. Cavitary process at the left lung apex better delineated on prior CT. Electronically Signed   By: Narda Rutherford M.D.   On: 02/20/2021 22:31   DG CHEST PORT 1 VIEW  Result Date: 02/20/2021 CLINICAL DATA:  65 year old female status post bronchoscopy, bronchoscopic biopsy of left upper lobe cavitary lesion four days ago. Left chest tube. EXAM: PORTABLE CHEST 1 VIEW COMPARISON:  02/19/2021 portable chest  and earlier. FINDINGS: Portable AP semi upright view at 0605 hours. Left apical cavitary lesion appears stable along with Patchy and confluent left lower lung opacity. Stable left chest tube. No definite pneumothorax today. Stable cardiac size and mediastinal contours. Reticulonodular opacity throughout the right lung is stable. Visualized tracheal air column is within normal limits. Ventilation not significantly changed since 02/18/2009. Stable visualized osseous structures. Negative visible bowel gas. IMPRESSION: 1. Stable left lung and left chest tube. No pneumothorax today. 2. Continued reticulonodular opacity throughout the right lung. 3. No new cardiopulmonary abnormality. Electronically Signed   By: Odessa Fleming M.D.   On: 02/20/2021 08:33   DG CHEST PORT 1 VIEW  Result Date: 02/19/2021 CLINICAL DATA:  Left pneumothorax.  Chest tube. EXAM: PORTABLE CHEST 1 VIEW COMPARISON:  Prior studies of 02/18/2021.  CT 02/12/2021. FINDINGS: Left chest tube in stable position. Reappearance of tiny left basilar pneumothorax may be present on today's examination. No prominent pneumothorax noted. Prominent skin folds noted on left. Diffuse left lung infiltrate with left upper lung cavitation again noted. Mild interstitial prominence on the right. Tiny left pleural effusion again cannot be excluded. Heart size stable. IMPRESSION: 1. Left chest tube in stable position. Reappearance of tiny left basilar pneumothorax may be present on today's examination. No prominent pneumothorax noted. 2. Diffuse left lung infiltrate with left upper lung cavitation again noted. Electronically Signed   By: Maisie Fus  Register   On: 02/19/2021 05:36   DG CHEST PORT 1 VIEW  Result Date: 02/18/2021 CLINICAL DATA:  Chest tube in place for pneumothorax EXAM: PORTABLE CHEST 1 VIEW COMPARISON:  February 18, 2021 study obtained earlier in the day; chest CT February 12, 2021 FINDINGS: Chest tube again noted on the left with resolution of left lateral and basilar  pneumothorax. There remains cavitation in the left upper lobe/apex region. Elsewhere there is consolidation throughout much of the mid and lower lung regions on the left. There is a small left pleural effusion. Ill-defined patchy airspace opacity remains on the right without progression. Heart size is normal. Pulmonary vascularity is normal on the right. Pulmonary vascularity on the left is mildly distorted but stable. No adenopathy appreciable by radiography. Bones appear somewhat osteoporotic. IMPRESSION: Chest tube remains on the left with interval resolution of left lateral and basilar pneumothorax. Cavitation left upper lobe persists with consolidation throughout much of the left mid and lower lung regions, stable. Equivocal left pleural effusion. Patchy infiltrate on the right stable without progression. Stable cardiac silhouette. Electronically Signed   By: Bretta Bang III M.D.   On: 02/18/2021 11:13   DG CHEST PORT 1 VIEW  Result Date: 02/18/2021 CLINICAL DATA:  Pneumothorax EXAM: PORTABLE CHEST 1 VIEW COMPARISON:  02/18/2021 FINDINGS: Since the prior examination, there has been little change. Loculated left basilar pneumothorax is again identified, though the collapsed left lung now demonstrates a a convex border with respect to the pneumothorax cavity suggesting some degree of alleviation of the previously identified tension physiology. Left chest tube, cavitary lesion within the left apex, dense consolidation within the collapsed left lung, and multifocal infiltrate within the right upper lung zone are unchanged. No pneumothorax or pleural effusion on the right. Cardiac size within normal limits. IMPRESSION: Left basilar loculated pneumothorax again identified with left chest tube in place. Slight alleviation of tension physiology with slight expansion of the collapsed left lung. Otherwise, no change. Electronically Signed   By: Helyn Numbers MD   On: 02/18/2021 04:57   DG CHEST PORT 1  VIEW  Result Date: 02/16/2021 CLINICAL DATA:  Status post bronchoscopy with biopsy EXAM: PORTABLE CHEST 1 VIEW COMPARISON:  02/16/2021 at 0554 hours FINDINGS: Patchy/airspace opacities in the left mid/lower lung with stable cavitary lesion in the left lung apex. Reticulonodular opacities throughout the right hemithorax. Indwelling left pigtail chest drain. No pneumothorax status post bronchoscopy. Leftward cardiomediastinal shift. IMPRESSION: No pneumothorax status post bronchoscopy. Otherwise unchanged. Electronically Signed   By: Charline Bills M.D.   On: 02/16/2021 15:52   DG Chest Port 1 View  Result Date: 02/16/2021 CLINICAL DATA:  Chest tube TB? EXAM: PORTABLE CHEST 1 VIEW COMPARISON:  02/15/2021 FINDINGS: Left chest tube unchanged in position. Left upper lobe cavitary mass again seen. Focal airspace opacity again noted within the right upper lobe. Cardiac silhouette obscured by left lung base opacity. IMPRESSION: No significant interval change in appearance of the chest left upper lobe cavitary mass and right upper and left lower lobe airspace opacity again seen. Electronically Signed   By: Acquanetta Belling M.D.   On: 02/16/2021 08:00   DG CHEST PORT 1 VIEW  Result Date: 02/15/2021 CLINICAL DATA:  Evaluate chest tube. EXAM: PORTABLE CHEST 1 VIEW COMPARISON:  February 14, 2021 FINDINGS: The known cavitary mass on the left is stable. A left chest tube remains in place. Dense opacity remains in the left mid and lower lung. Prominent skin folds are seen on the right. There appear to be lung markings on both sides of the skin folds without convincing  evidence of pneumothorax. Patchy opacity throughout the right lung is stable. No other changes. IMPRESSION: 1. No change in left chest tube, dense left basilar infiltrate, or known cavitary lesion in the left apex. 2. No change in patchy right-sided pulmonary opacities. 3. Prominent skin folds on the right somewhat limiting evaluation but there is no convincing  evidence of pneumothorax. Electronically Signed   By: Gerome Sam III M.D   On: 02/15/2021 08:47   DG CHEST PORT 1 VIEW  Result Date: 02/14/2021 CLINICAL DATA:  Shortness of breath. Evaluate left apex cavitary lesion. EXAM: PORTABLE CHEST 1 VIEW COMPARISON:  February 13, 2021 FINDINGS: The left-sided chest tube is stable. The cavitary lesion in the left apex is stable, better appreciated on previous CT imaging. Prominent skin folds are seen over the lateral right chest. No convincing evidence of pneumothorax on the right. Infiltrate throughout the right lung has worsened. Opacity in the left base is dense and stable. No other interval changes. IMPRESSION: 1. Stable left chest tube. 2. Stable cavitary mass in the left apex better seen on previous CT imaging. 3. Stable dense infiltrate in left base. 4. Increasing patchy infiltrate in the right lung. Electronically Signed   By: Gerome Sam III M.D   On: 02/14/2021 09:58   DG Chest Port 1 View  Result Date: 02/13/2021 CLINICAL DATA:  Follow-up chest tube on the left EXAM: PORTABLE CHEST 1 VIEW COMPARISON:  02/12/2021 FINDINGS: Cardiac shadow is stable. Pigtail catheter is again noted on the left. No definitive pneumothorax is noted. Large cavitary lesion is again noted in the left apex similar to that seen on recent CT examination. Patchy reticulonodular infiltrate is noted throughout the right lung. Consolidation in the left lung base is again seen and stable. No bony abnormality is noted. IMPRESSION: Overall stable appearance of the chest from the most recent CT examination. Large left apex cavitary lesion is noted as well as bilateral infiltrates worse in the left base than the right. Electronically Signed   By: Alcide Clever M.D.   On: 02/13/2021 05:55   DG Chest Portable 1 View  Result Date: 02/12/2021 CLINICAL DATA:  Chest tube placement EXAM: PORTABLE CHEST 1 VIEW COMPARISON:  02/12/2021 FINDINGS: Interval placement of left-sided chest tube with  pigtail at the left lateral mid chest. Decreased size of left pneumothorax with decreased mediastinal shift to the right. Possible small loculated air collection within the left upper lung. Small left effusion. Airspace disease throughout the left lung. Slightly nodular appearing opacities in the right upper and lower lung. Probable bullous change at the left apex. Aortic atherosclerosis. IMPRESSION: 1. Interval placement of left-sided chest tube with decreased left pneumothorax and decreased mediastinal shift to the right. Airspace disease throughout the left lung which may reflect edema or diffuse pneumonia, with small left effusion. Possible small loculated air collection within the left upper lung. 2. Nodular appearing opacities in the right upper and lower lung, question small nodules or nodular infiltrates. CT chest could better evaluate this finding. Electronically Signed   By: Jasmine Pang M.D.   On: 02/12/2021 16:11   DG Chest Portable 1 View  Addendum Date: 02/12/2021   ADDENDUM REPORT: 02/12/2021 14:08 ADDENDUM: Critical Value/emergent results were called by telephone at the time of interpretation on 02/12/2021 at 2:08 pm to provider MATTHEW TRIFAN , who verbally acknowledged these results. Electronically Signed   By: Beckie Salts M.D.   On: 02/12/2021 14:08   Result Date: 02/12/2021 CLINICAL DATA:  Follow-up large left  tension pneumothorax. EXAM: PORTABLE CHEST 1 VIEW COMPARISON:  Earlier today at [employer]. FINDINGS: An approximately 90% left hydropneumothorax with severe compression and atelectasis of the lung medially and marked mediastinal shift to the right is again demonstrated. The heart remains normal in size. Vascular crowding in the right lung without significant change. Diffuse osteopenia. IMPRESSION: Approximately 90% left tension hydropneumothorax with severe compression and atelectasis of the lung medially and marked mediastinal shift to the right, unchanged since earlier today.  Electronically Signed: By: Beckie Salts M.D. On: 02/12/2021 14:00   ECHOCARDIOGRAM COMPLETE  Result Date: 02/23/2021    ECHOCARDIOGRAM REPORT   Patient Name:   CARIS CERVENY Date of Exam: 02/23/2021 Medical Rec #:  [id number]         Height:       66.0 in Accession #:    [id number]        Weight:       100.3 lb Date of Birth:  03-Sep-1956         BSA:          1.491 m Patient Age:    64 years          BP:           114/74 mmHg Patient Gender: F                 HR:           106 bpm. Exam Location:  Inpatient Procedure: 2D Echo, Cardiac Doppler and Color Doppler Indications:    Dyspnea R06.00  History:        Patient has no prior history of Echocardiogram examinations.                 Signs/Symptoms:Dyspnea.  Sonographer:    Eulah Pont RDCS Referring Phys: [id number] [facility] [place]  Sonographer Comments: Image acquisition challenging due to respiratory motion. IMPRESSIONS  1. Left ventricular ejection fraction, by estimation, is 20 to 25%. The left ventricle has severely decreased function. The left ventricle demonstrates regional wall motion abnormalities (see scoring diagram/findings for description). Left ventricular diastolic parameters are consistent with Grade III diastolic dysfunction (restrictive). There is severe akinesis of the left ventricular, mid-apical anteroseptal wall and inferior wall.  2. Right ventricular systolic function is normal. The right ventricular size is normal. There is normal pulmonary artery systolic pressure.  3. The mitral valve is normal in structure. Mild mitral valve regurgitation.  4. The aortic valve is grossly normal. There is mild calcification of the aortic valve. Aortic valve regurgitation is not visualized. No aortic stenosis is present. FINDINGS  Left Ventricle: Left ventricular ejection fraction, by estimation, is 20 to 25%. The left ventricle has severely decreased function. The left ventricle demonstrates regional wall motion abnormalities. Severe akinesis  of the left ventricular, mid-apical anteroseptal wall and inferior wall. The left ventricular internal cavity size was normal in size. There is no left ventricular hypertrophy. Left ventricular diastolic parameters are consistent with Grade III diastolic dysfunction (restrictive). Right Ventricle: The right ventricular size is normal. No increase in right ventricular wall thickness. Right ventricular systolic function is normal. There is normal pulmonary artery systolic pressure. The tricuspid regurgitant velocity is 2.11 m/s, and  with an assumed right atrial pressure of 3 mmHg, the estimated right ventricular systolic pressure is 20.8 mmHg. Left Atrium: Left atrial size was normal in size. Right Atrium: Right atrial size was normal in size. Pericardium: There is no evidence of pericardial effusion. Mitral Valve: The mitral valve  is normal in structure. Mild mitral valve regurgitation. Tricuspid Valve: The tricuspid valve is grossly normal. Tricuspid valve regurgitation is trivial. Aortic Valve: The aortic valve is grossly normal. There is mild calcification of the aortic valve. Aortic valve regurgitation is not visualized. No aortic stenosis is present. Pulmonic Valve: The pulmonic valve was normal in structure. Pulmonic valve regurgitation is trivial. Aorta: The aortic root and ascending aorta are structurally normal, with no evidence of dilitation. IAS/Shunts: The atrial septum is grossly normal.  LEFT VENTRICLE PLAX 2D LVIDd:         4.70 cm LVIDs:         3.30 cm LV PW:         0.70 cm LV IVS:        0.70 cm LVOT diam:     2.10 cm LV SV:         63 LV SV Index:   42 LVOT Area:     3.46 cm  LV Volumes (MOD) LV vol d, MOD A2C: 107.0 ml LV vol d, MOD A4C: 96.4 ml LV vol s, MOD A2C: 73.8 ml LV vol s, MOD A4C: 60.1 ml LV SV MOD A2C:     33.2 ml LV SV MOD A4C:     96.4 ml LV SV MOD BP:      35.7 ml RIGHT VENTRICLE TAPSE (M-mode): 1.3 cm LEFT ATRIUM             Index       RIGHT ATRIUM           Index LA diam:         2.50 cm 1.68 cm/m  RA Area:     12.10 cm LA Vol (A2C):   26.4 ml 17.70 ml/m RA Volume:   29.40 ml  19.71 ml/m LA Vol (A4C):   19.3 ml 12.94 ml/m LA Biplane Vol: 24.3 ml 16.29 ml/m  AORTIC VALVE LVOT Vmax:   86.30 cm/s LVOT Vmean:  54.933 cm/s LVOT VTI:    0.183 m  AORTA Ao Root diam: 3.50 cm Ao Asc diam:  3.70 cm TRICUSPID VALVE TR Peak grad:   17.8 mmHg TR Vmax:        211.00 cm/s  SHUNTS Systemic VTI:  0.18 m Systemic Diam: 2.10 cm Kristeen Miss MD Electronically signed by Kristeen Miss MD Signature Date/Time: 02/23/2021/1:11:34 PM    Final       Procedures: Left chest tube/ bronchoscopy   Subjective: Patient reports feeling well, denies any chest pain, nausea or vomiting.  Her respiratory rate continued to be elevated, rapid shallow breathing.  She has no longer chest tube on the left side.  Discharge Exam: Vitals:   03/03/21 1100 03/03/21 1516  BP: (!) 152/71   Pulse: (!) 40 (!) 104  Resp: (!) 30 (!) 28  Temp:    SpO2: (!) 88% 100%   Vitals:   03/03/21 0815 03/03/21 1044 03/03/21 1100 03/03/21 1516  BP:   (!) 152/71   Pulse: (!) 113 (!) 117 (!) 40 (!) 104  Resp: (!) 24 (!) 26 (!) 30 (!) 28  Temp:      TempSrc:      SpO2: 91% 91% (!) 88% 100%  Weight:      Height:        General: deconditioned and ill looking appearing  Neurology: Awake and alert, non focal  E ENT: no pallor, no icterus, oral mucosa moist Cardiovascular: No JVD. S1-S2 present, rhythmic, no gallops, rubs, or murmurs.  positive lower extremity pedal edema 3+. Pulmonary: positive breath sounds bilaterally,  no wheezing, scattered rhonchi with no rales. Resonant breath sounds to auscultation at the left upper zone with hyperesononat to percussion at the left lower zone.  Gastrointestinal. Abdomen soft and non tender Skin. No rashes Musculoskeletal: no joint deformities   The results of significant diagnostics from this hospitalization (including imaging, microbiology, ancillary and laboratory) are listed  below for reference.     Microbiology: No results found for this or any previous visit (from the past 240 hour(s)).   Labs: BNP (last 3 results) No results for input(s): BNP in the last 8760 hours. Basic Metabolic Panel: No results for input(s): NA, K, CL, CO2, GLUCOSE, BUN, CREATININE, CALCIUM, MG, PHOS in the last 168 hours. Liver Function Tests: No results for input(s): AST, ALT, ALKPHOS, BILITOT, PROT, ALBUMIN in the last 168 hours. No results for input(s): LIPASE, AMYLASE in the last 168 hours. No results for input(s): AMMONIA in the last 168 hours. CBC: No results for input(s): WBC, NEUTROABS, HGB, HCT, MCV, PLT in the last 168 hours. Cardiac Enzymes: No results for input(s): CKTOTAL, CKMB, CKMBINDEX, TROPONINI in the last 168 hours. BNP: Invalid input(s): POCBNP CBG: Recent Labs  Lab 03/03/21 0611 03/03/21 1149 03/03/21 1601 03/03/21 2113 03-25-2021 0703  GLUCAP 192* 223* 219* 153* 97   D-Dimer No results for input(s): DDIMER in the last 72 hours. Hgb A1c No results for input(s): HGBA1C in the last 72 hours. Lipid Profile No results for input(s): CHOL, HDL, LDLCALC, TRIG, CHOLHDL, LDLDIRECT in the last 72 hours. Thyroid function studies No results for input(s): TSH, T4TOTAL, T3FREE, THYROIDAB in the last 72 hours.  Invalid input(s): FREET3 Anemia work up No results for input(s): VITAMINB12, FOLATE, FERRITIN, TIBC, IRON, RETICCTPCT in the last 72 hours. Urinalysis    Component Value Date/Time   COLORURINE YELLOW 02/13/2021 1900   APPEARANCEUR CLEAR 02/13/2021 1900   LABSPEC 1.013 02/13/2021 1900   PHURINE 6.0 02/13/2021 1900   GLUCOSEU 150 (A) 02/13/2021 1900   HGBUR SMALL (A) 02/13/2021 1900   HGBUR negative 01/19/2008 0750   BILIRUBINUR NEGATIVE 02/13/2021 1900   KETONESUR NEGATIVE 02/13/2021 1900   PROTEINUR NEGATIVE 02/13/2021 1900   UROBILINOGEN 0.2 01/19/2008 0750   NITRITE NEGATIVE 02/13/2021 1900   LEUKOCYTESUR NEGATIVE 02/13/2021 1900   Sepsis  Labs Invalid input(s): PROCALCITONIN,  WBC,  LACTICIDVEN Microbiology No results found for this or any previous visit (from the past 240 hour(s)).   Time coordinating discharge: 45 minutes  SIGNED:   Coralie Keens, MD  [facility] 03-25-2021, 10:41 AM

## 2021-03-05 LAB — AFB ORGANISM ID BY DNA PROBE
M avium complex: NEGATIVE
M tuberculosis complex: POSITIVE — AB

## 2021-03-05 LAB — ACID FAST CULTURE WITH REFLEXED SENSITIVITIES (MYCOBACTERIA): Acid Fast Culture: POSITIVE — AB

## 2021-03-12 LAB — AFB ORGANISM ID BY DNA PROBE
M avium complex: NEGATIVE
M tuberculosis complex: POSITIVE — AB

## 2021-03-12 LAB — ACID FAST CULTURE WITH REFLEXED SENSITIVITIES (MYCOBACTERIA): Acid Fast Culture: POSITIVE — AB

## 2021-03-13 DEATH — deceased

## 2021-03-16 LAB — AFB ORGANISM ID BY DNA PROBE
M avium complex: NEGATIVE
M tuberculosis complex: POSITIVE — AB

## 2021-03-16 LAB — MTB SUSCEPTIBILITY BROTH, REFLEXED

## 2021-03-16 LAB — ACID FAST CULTURE WITH REFLEXED SENSITIVITIES (MYCOBACTERIA): Acid Fast Culture: POSITIVE — AB

## 2021-03-18 LAB — FUNGAL ORGANISM REFLEX

## 2021-03-18 LAB — FUNGUS CULTURE WITH STAIN

## 2021-03-18 LAB — FUNGUS CULTURE RESULT

## 2021-03-21 LAB — FUNGUS CULTURE WITH STAIN

## 2021-03-21 LAB — FUNGUS CULTURE RESULT

## 2021-03-21 LAB — FUNGAL ORGANISM REFLEX

## 2021-09-09 IMAGING — DX DG CHEST 1V PORT
1 series · 1 of 1 positions shown · non-contrast
Comparison: 02/12/2021

CLINICAL DATA: Chest tube placement

EXAM:
PORTABLE CHEST 1 VIEW

[chest ap]
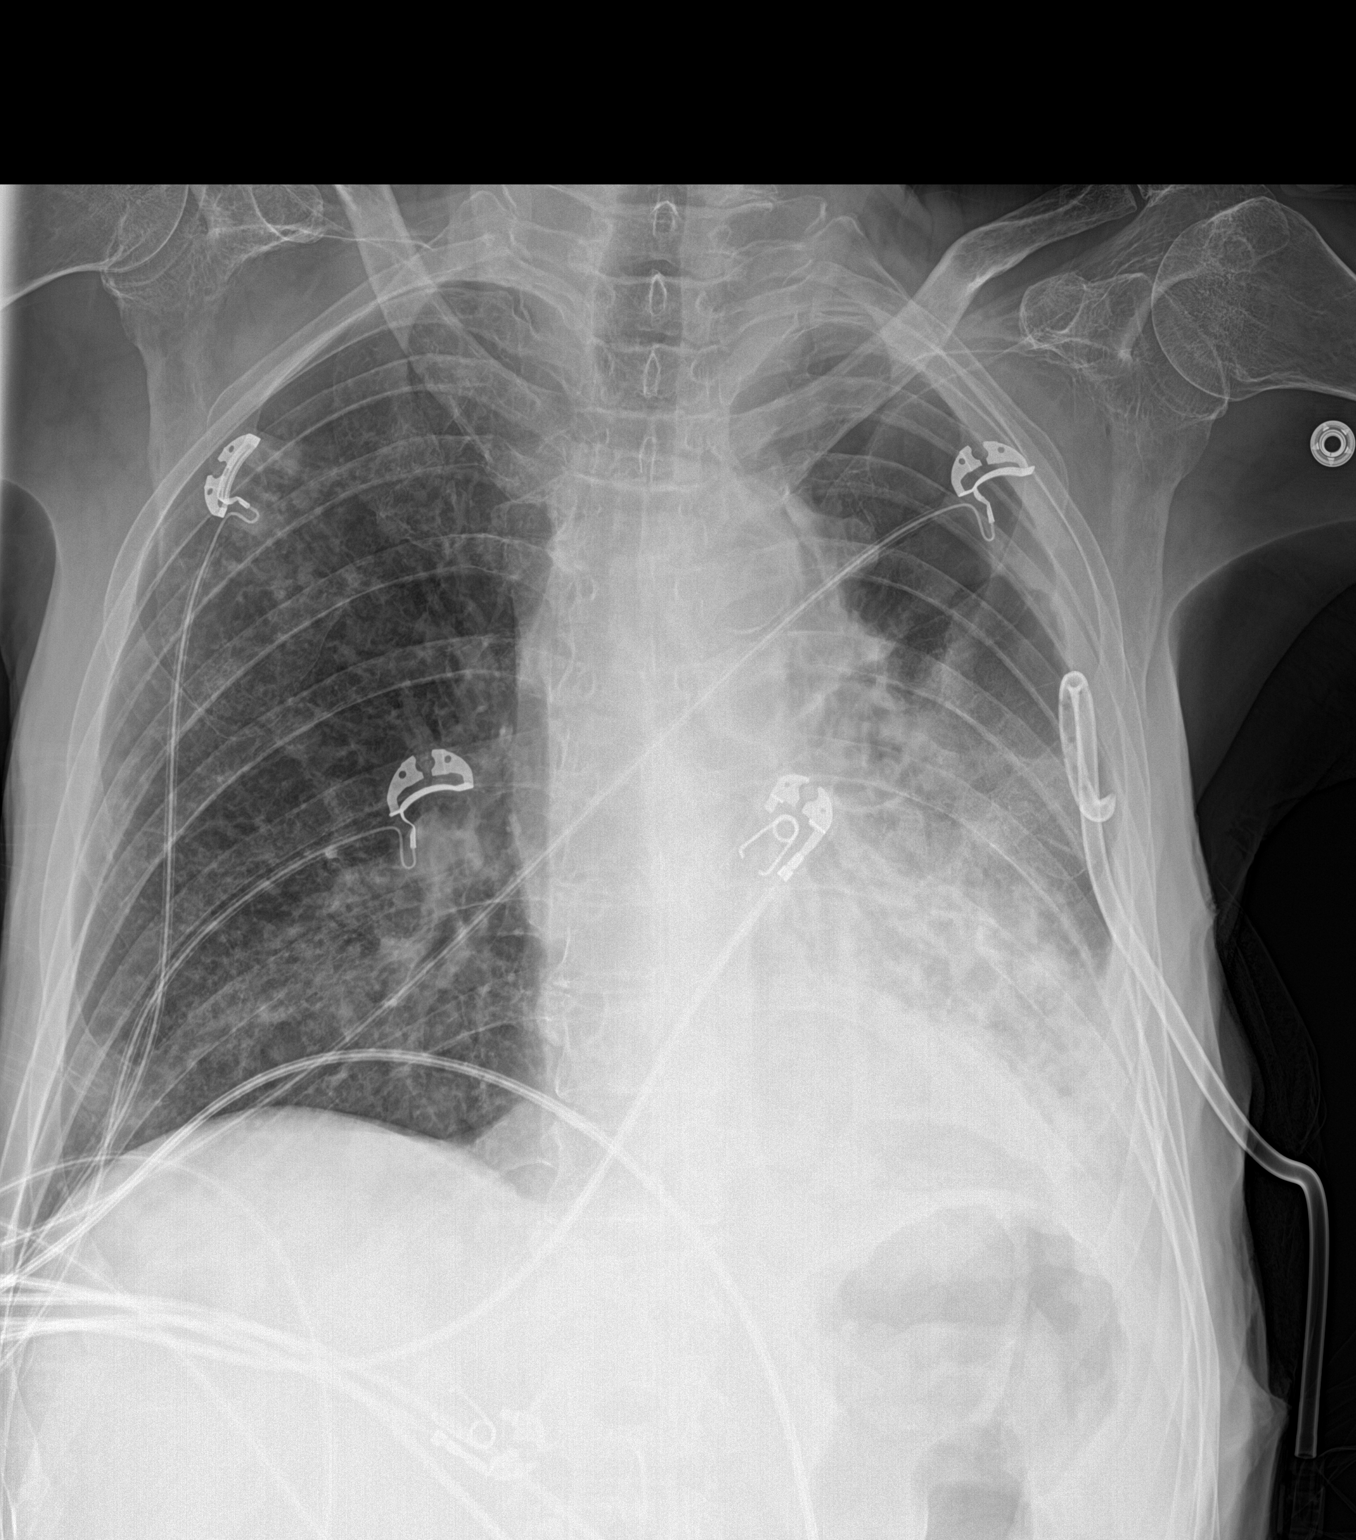

[1 of 1 positions shown; findings below may reference images not displayed]

FINDINGS: Interval placement of left-sided chest tube with pigtail at the left
lateral mid chest. Decreased size of left pneumothorax with
decreased mediastinal shift to the right. Possible small loculated
air collection within the left upper lung. Small left effusion.
Airspace disease throughout the left lung. Slightly nodular
appearing opacities in the right upper and lower lung. Probable
bullous change at the left apex. Aortic atherosclerosis.
IMPRESSION: 1. Interval placement of left-sided chest tube with decreased left
pneumothorax and decreased mediastinal shift to the right. Airspace
disease throughout the left lung which may reflect edema or diffuse
pneumonia, with small left effusion. Possible small loculated air
collection within the left upper lung.
2. Nodular appearing opacities in the right upper and lower lung,
question small nodules or nodular infiltrates. CT chest could better
evaluate this finding.

## 2021-09-10 IMAGING — DX DG CHEST 1V PORT
1 series · 1 of 1 positions shown · non-contrast
Comparison: 02/12/2021

CLINICAL DATA: Follow-up chest tube on the left

EXAM:
PORTABLE CHEST 1 VIEW

[chest]
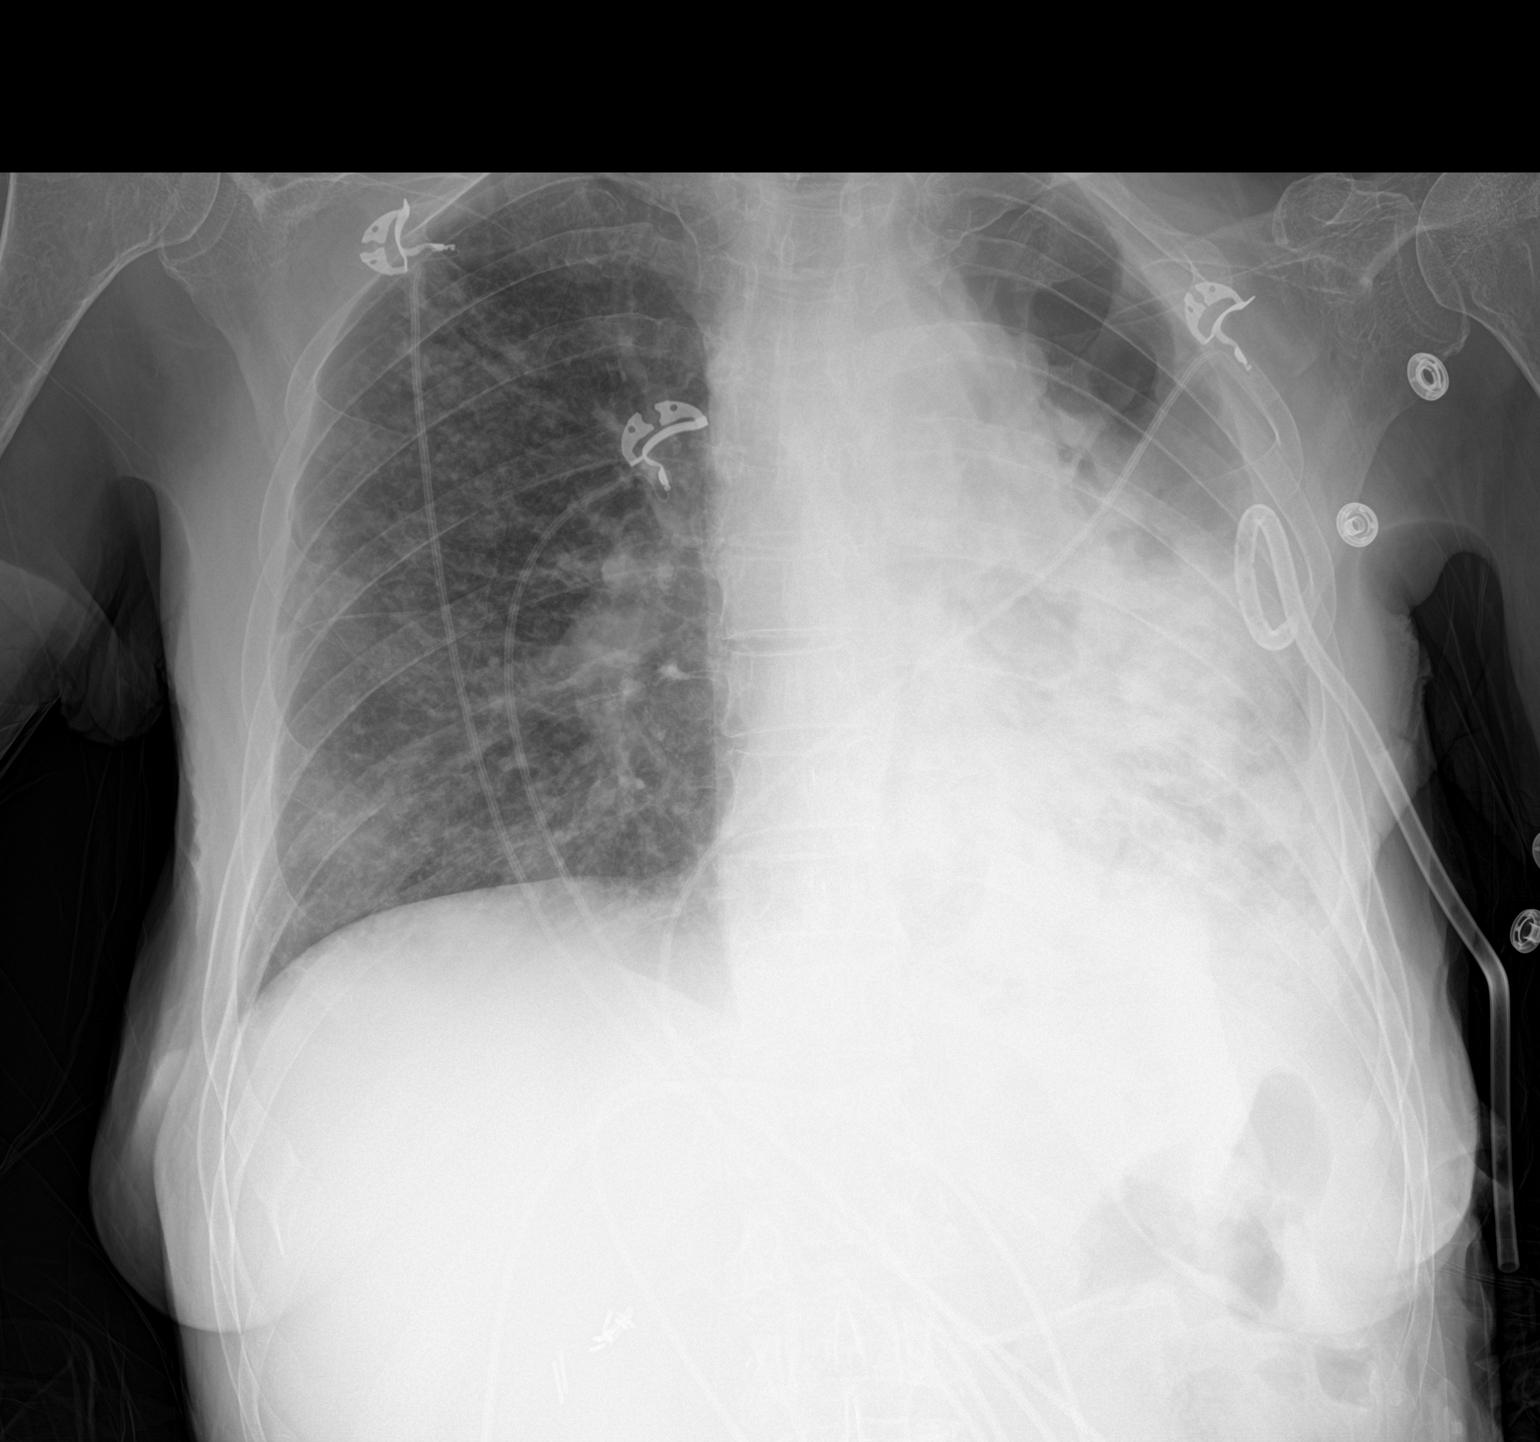

[1 of 1 positions shown; findings below may reference images not displayed]

FINDINGS: Cardiac shadow is stable. Pigtail catheter is again noted on the
left. No definitive pneumothorax is noted. Large cavitary lesion is
again noted in the left apex similar to that seen on recent CT
examination. Patchy reticulonodular infiltrate is noted throughout
the right lung. Consolidation in the left lung base is again seen
and stable. No bony abnormality is noted.
IMPRESSION: Overall stable appearance of the chest from the most recent CT
examination. Large left apex cavitary lesion is noted as well as
bilateral infiltrates worse in the left base than the right.

## 2021-09-13 IMAGING — DX DG CHEST 1V PORT
1 series · 1 of 1 positions shown · non-contrast
Comparison: 02/16/2021 at 2220 hours

CLINICAL DATA: Status post bronchoscopy with biopsy

EXAM:
PORTABLE CHEST 1 VIEW

[chest]
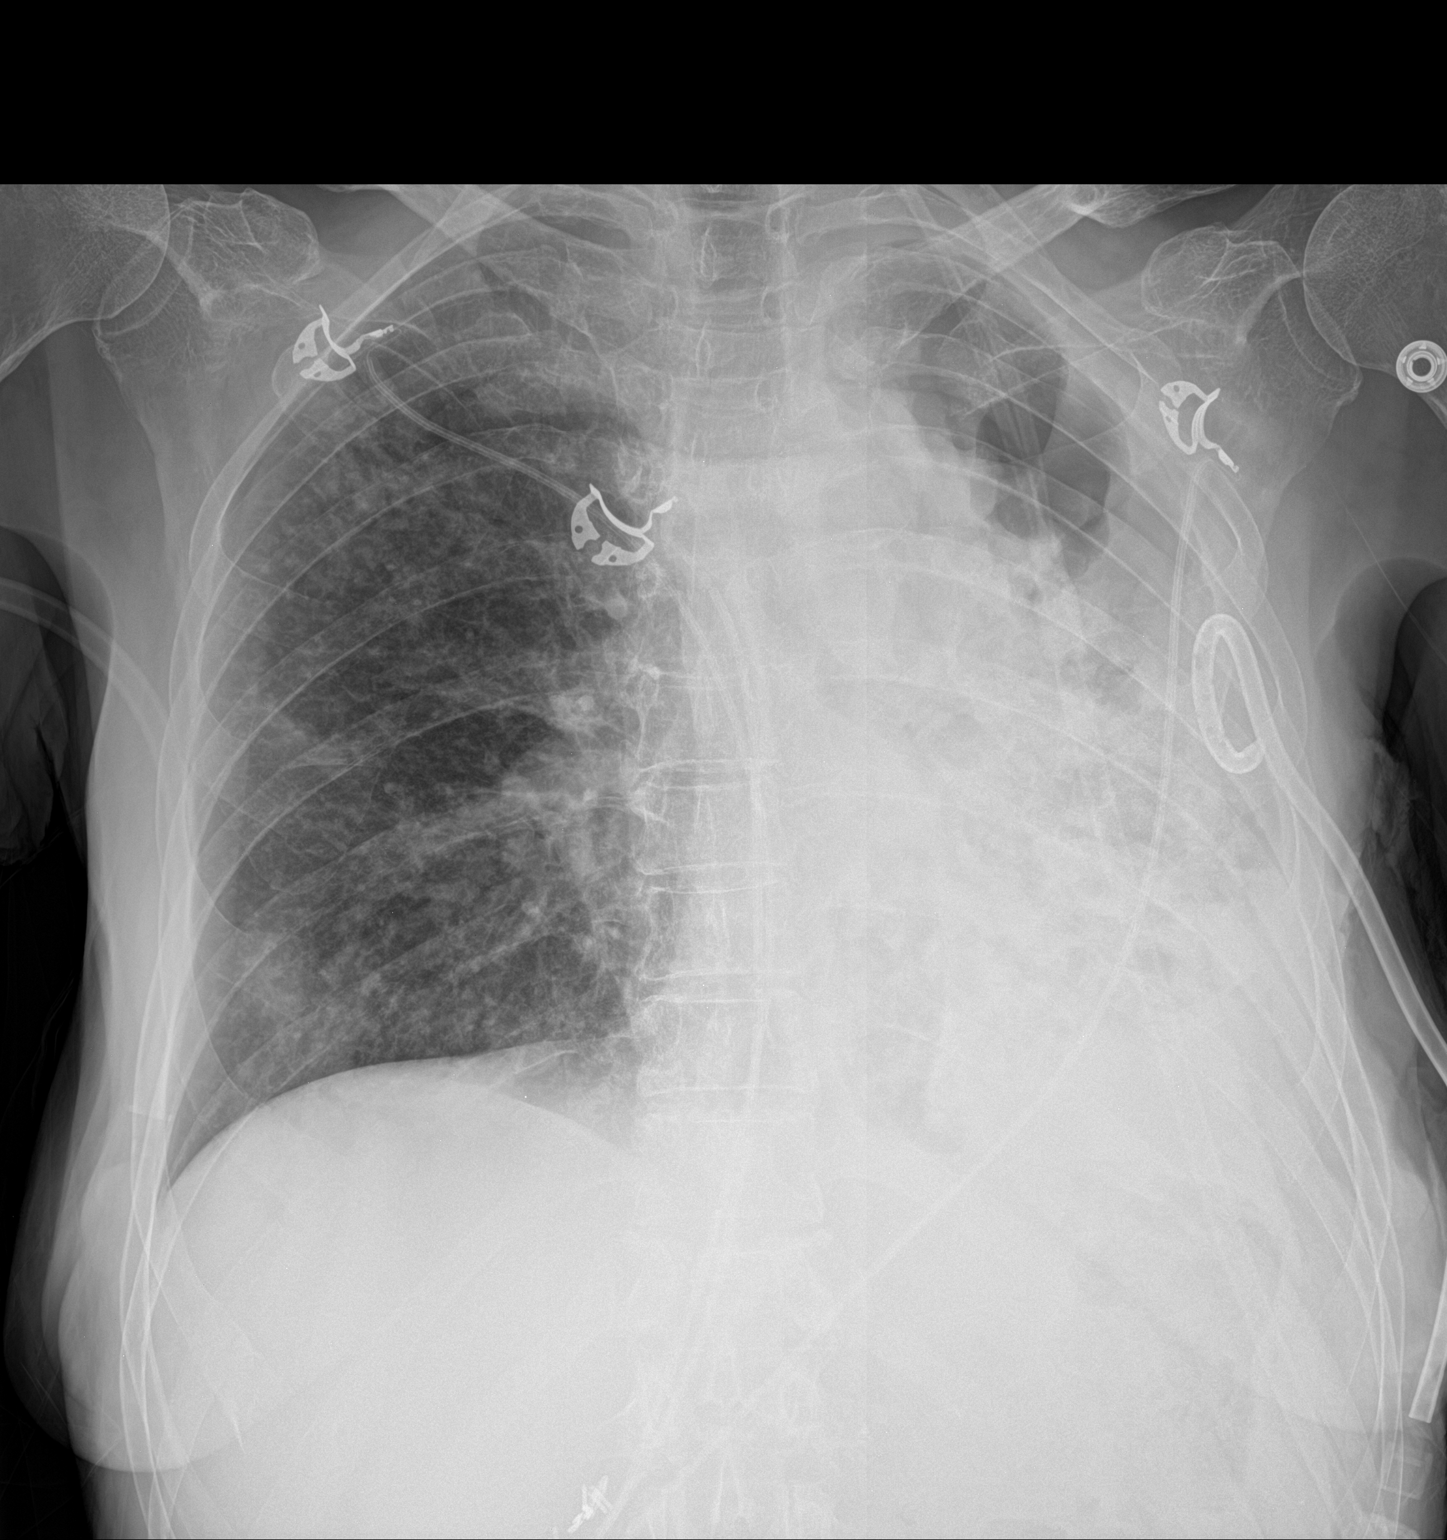

[1 of 1 positions shown; findings below may reference images not displayed]

FINDINGS: Patchy/airspace opacities in the left mid/lower lung with stable
cavitary lesion in the left lung apex.

Reticulonodular opacities throughout the right hemithorax.

Indwelling left pigtail chest drain.

No pneumothorax status post bronchoscopy.

Leftward cardiomediastinal shift.
IMPRESSION: No pneumothorax status post bronchoscopy.

Otherwise unchanged.

## 2021-09-15 IMAGING — DX DG CHEST 1V
1 series · 1 of 1 positions shown · non-contrast
Comparison: 02/16/2021

CLINICAL DATA: Pneumothorax

EXAM:
CHEST  1 VIEW

[chest ap]
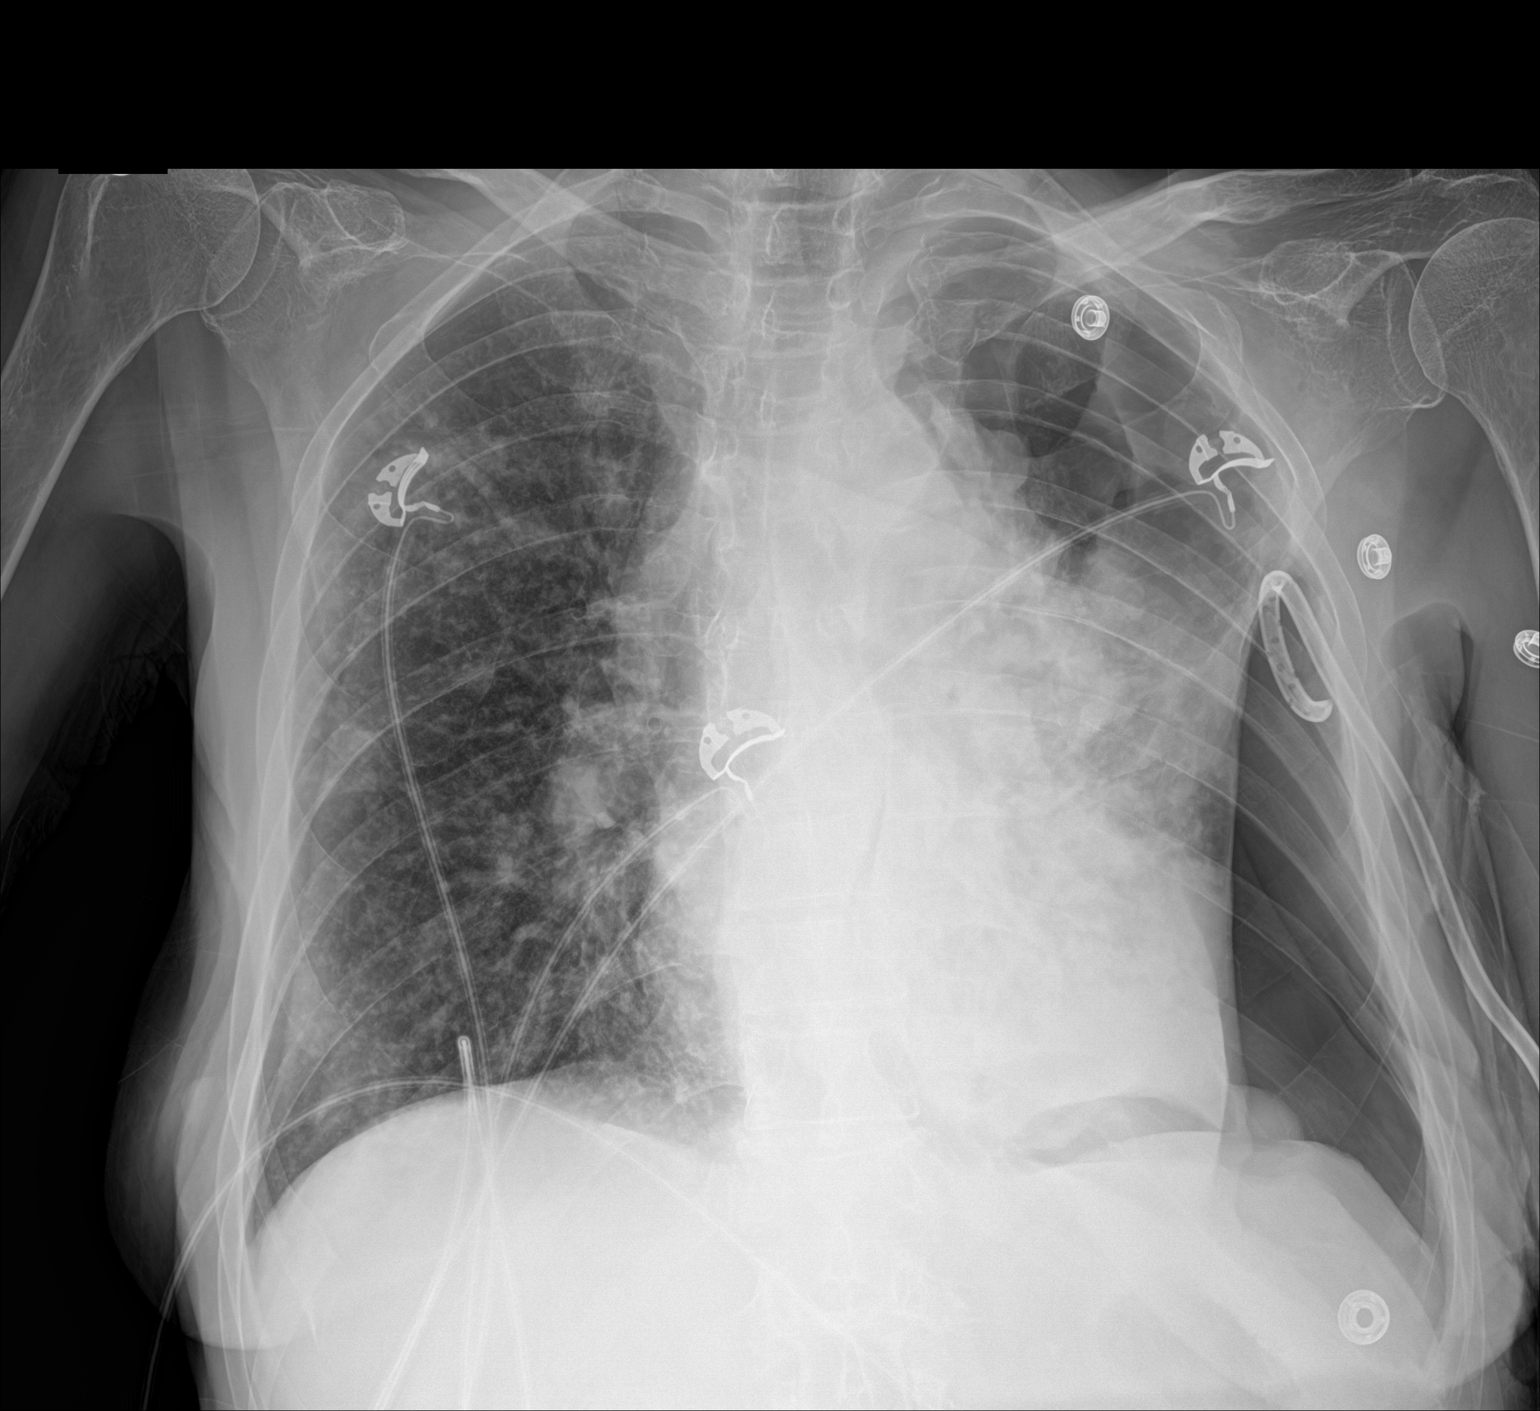

[1 of 1 positions shown; findings below may reference images not displayed]

FINDINGS: Since the prior examination, a laterally loculated pneumothorax has
developed. Pigtail chest tube is unchanged in position. There is
dense opacification of the now partially collapsed left lower lobe.
Cavitary lesion noted at the left apex, unchanged. Patchy infiltrate
within the right mid and upper lung zone appears stable. No
pneumothorax or pleural effusion on the right. Cardiac size within
normal limits. Mild mediastinal shift to the right, however, suggest
at least some degree of tension physiology.
IMPRESSION: Interval development of a left laterally loculated pneumothorax
demonstrating some degree of tension physiology with mediastinal
shift to the right. Left pigtail chest tube is unchanged in position
within the pleural space and correlation for appropriate function of
the chest tube is recommended

Stable left apical cavitary lesion.

Stable dense consolidation of the left lung and multifocal pulmonary
infiltrate within the right mid and upper lung zone, likely
infectious.

These results will be called to the ordering clinician or
representative by the Radiologist Assistant, and communication
documented in the PACS or [REDACTED].

## 2022-10-12 LAB — MTB RIF NAA NON-SPUTUM, W/O CULTURE
